# Patient Record
Sex: Male | Born: 1960 | Race: White | Hispanic: No | Marital: Single | Smoking: Former smoker
Health system: Southern US, Community
[De-identification: ages and names within clinical notes are randomized; demographics above are authoritative.]

## PROBLEM LIST (undated history)

## (undated) DIAGNOSIS — E785 Hyperlipidemia, unspecified: Secondary | ICD-10-CM

## (undated) DIAGNOSIS — K5900 Constipation, unspecified: Secondary | ICD-10-CM

## (undated) DIAGNOSIS — I639 Cerebral infarction, unspecified: Secondary | ICD-10-CM

## (undated) DIAGNOSIS — I69351 Hemiplegia and hemiparesis following cerebral infarction affecting right dominant side: Secondary | ICD-10-CM

## (undated) DIAGNOSIS — M79604 Pain in right leg: Secondary | ICD-10-CM

## (undated) DIAGNOSIS — R471 Dysarthria and anarthria: Secondary | ICD-10-CM

## (undated) DIAGNOSIS — R197 Diarrhea, unspecified: Secondary | ICD-10-CM

---

## 2020-01-31 ENCOUNTER — Encounter: Payer: Self-pay | Admitting: Emergency Medicine

## 2020-01-31 ENCOUNTER — Emergency Department: Payer: Medicaid Other

## 2020-01-31 ENCOUNTER — Emergency Department
Admission: EM | Admit: 2020-01-31 | Discharge: 2020-01-31 | Disposition: A | Discharge status: Home / Self Care | Payer: Medicaid Other | Attending: Emergency Medicine | Admitting: Emergency Medicine

## 2020-01-31 ENCOUNTER — Other Ambulatory Visit: Payer: Self-pay

## 2020-01-31 DIAGNOSIS — Z87891 Personal history of nicotine dependence: Secondary | ICD-10-CM | POA: Diagnosis not present

## 2020-01-31 DIAGNOSIS — R112 Nausea with vomiting, unspecified: Secondary | ICD-10-CM | POA: Diagnosis present

## 2020-01-31 DIAGNOSIS — I82411 Acute embolism and thrombosis of right femoral vein: Secondary | ICD-10-CM

## 2020-01-31 DIAGNOSIS — R531 Weakness: Secondary | ICD-10-CM | POA: Insufficient documentation

## 2020-01-31 DIAGNOSIS — R197 Diarrhea, unspecified: Secondary | ICD-10-CM | POA: Insufficient documentation

## 2020-01-31 DIAGNOSIS — Z20822 Contact with and (suspected) exposure to covid-19: Secondary | ICD-10-CM | POA: Diagnosis not present

## 2020-01-31 DIAGNOSIS — M7989 Other specified soft tissue disorders: Secondary | ICD-10-CM | POA: Insufficient documentation

## 2020-01-31 DIAGNOSIS — L03115 Cellulitis of right lower limb: Secondary | ICD-10-CM

## 2020-01-31 HISTORY — DX: Dysarthria and anarthria: R47.1

## 2020-01-31 HISTORY — DX: Constipation, unspecified: K59.00

## 2020-01-31 HISTORY — DX: Cerebral infarction, unspecified: I63.9

## 2020-01-31 HISTORY — DX: Diarrhea, unspecified: R19.7

## 2020-01-31 HISTORY — DX: Pain in right leg: M79.604

## 2020-01-31 HISTORY — DX: Hemiplegia and hemiparesis following cerebral infarction affecting right dominant side: I69.351

## 2020-01-31 HISTORY — DX: Hyperlipidemia, unspecified: E78.5

## 2020-01-31 LAB — CBC
HCT: 42.2 % (ref 39.0–52.0)
Hemoglobin: 13.6 g/dL (ref 13.0–17.0)
MCH: 30.7 pg (ref 26.0–34.0)
MCHC: 32.2 g/dL (ref 30.0–36.0)
MCV: 95.3 fL (ref 80.0–100.0)
Platelets: 320 10*3/uL (ref 150–400)
RBC: 4.43 MIL/uL (ref 4.22–5.81)
RDW: 12.9 % (ref 11.5–15.5)
WBC: 9.7 10*3/uL (ref 4.0–10.5)
nRBC: 0 % (ref 0.0–0.2)

## 2020-01-31 LAB — RESP PANEL BY RT-PCR (FLU A&B, COVID) ARPGX2
Influenza A by PCR: NEGATIVE
Influenza B by PCR: NEGATIVE
SARS Coronavirus 2 by RT PCR: NEGATIVE

## 2020-01-31 LAB — BASIC METABOLIC PANEL
Anion gap: 9 (ref 5–15)
BUN: 18 mg/dL (ref 6–20)
CO2: 28 mmol/L (ref 22–32)
Calcium: 8.8 mg/dL — ABNORMAL LOW (ref 8.9–10.3)
Chloride: 104 mmol/L (ref 98–111)
Creatinine, Ser: 0.94 mg/dL (ref 0.61–1.24)
GFR, Estimated: >60 mL/min (ref >60)
Glucose, Bld: 109 mg/dL — ABNORMAL HIGH (ref 70–99)
Potassium: 3.8 mmol/L (ref 3.5–5.1)
Sodium: 141 mmol/L (ref 135–145)

## 2020-01-31 IMAGING — US US EXTREM LOW VENOUS*R*
1 series · 14 of 24 positions shown · non-contrast
Comparison: None.

CLINICAL DATA: Pain and swelling.  Symptoms for 1 week.

EXAM:
RIGHT LOWER EXTREMITY VENOUS DOPPLER ULTRASOUND
TECHNIQUE: Gray-scale sonography with compression, as well as color and duplex
ultrasound, were performed to evaluate the deep venous system(s)
from the level of the common femoral vein through the popliteal and
proximal calf veins.

[Series 1: us venous img lower uni right (dvt) · portal-venous · 14 of 35 slices shown]
[im 1/35]
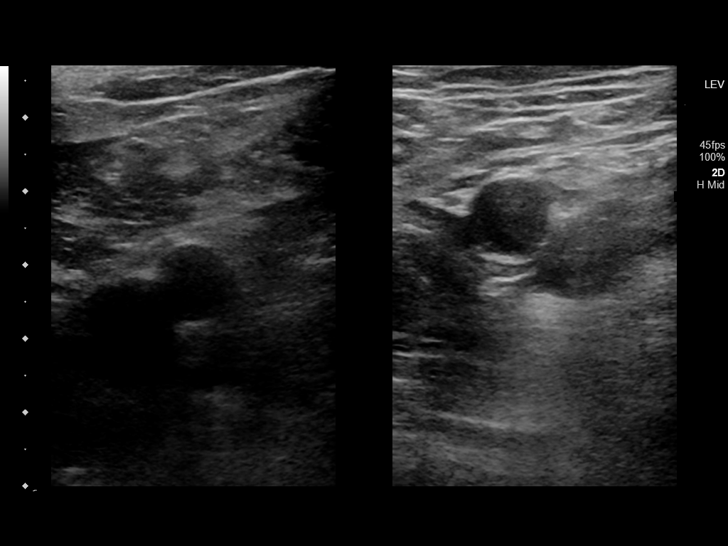
[im 3/35]
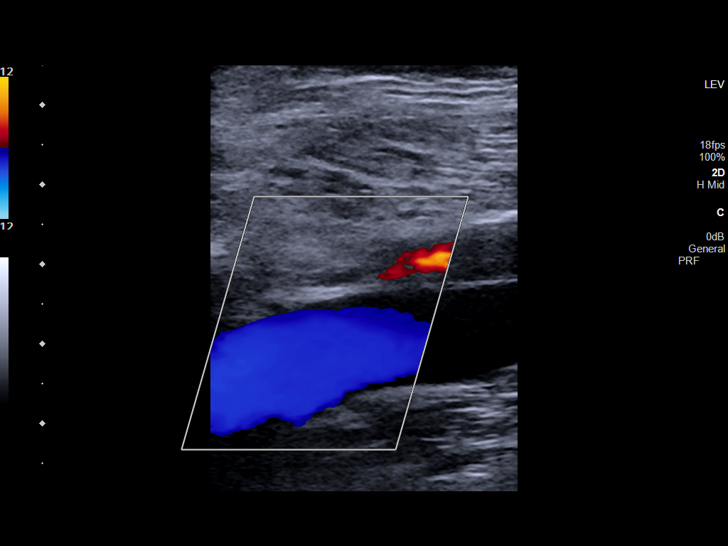
[im 6/35]
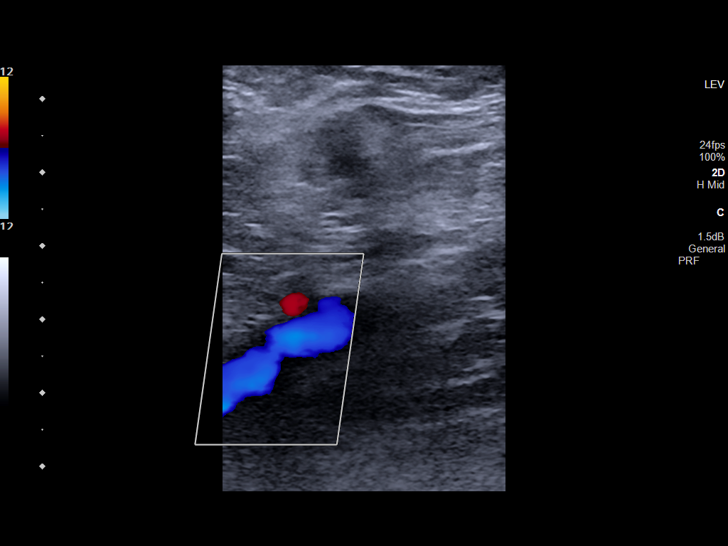
[im 9/35]
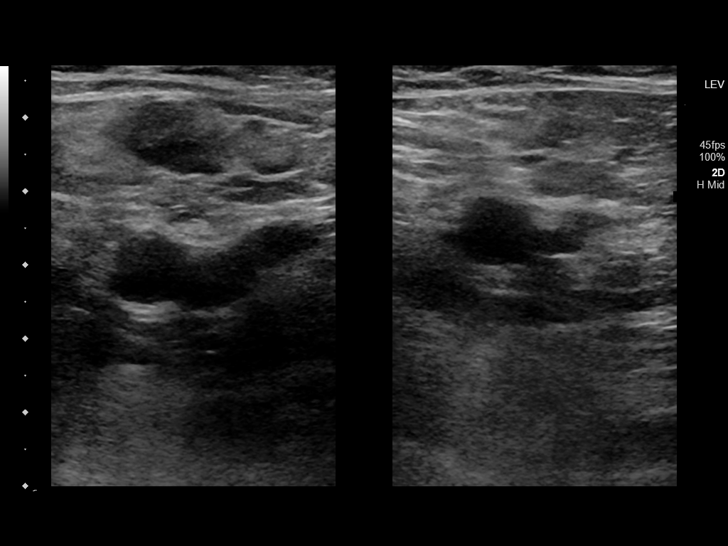
[im 11/35]
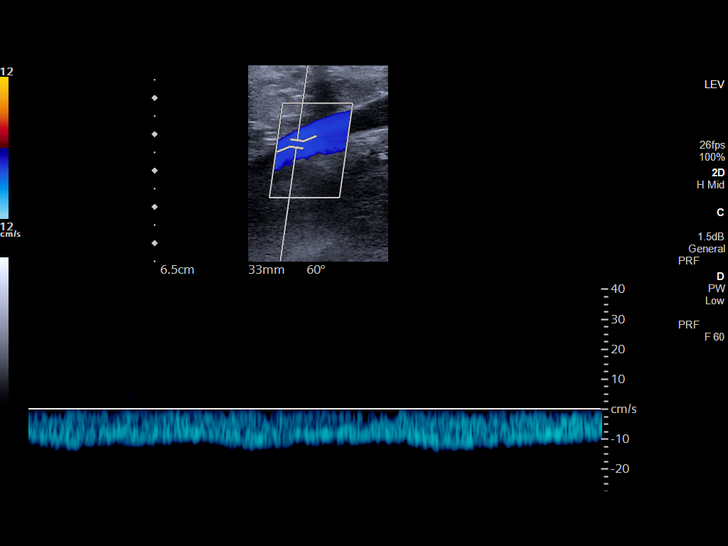
[im 14/35]
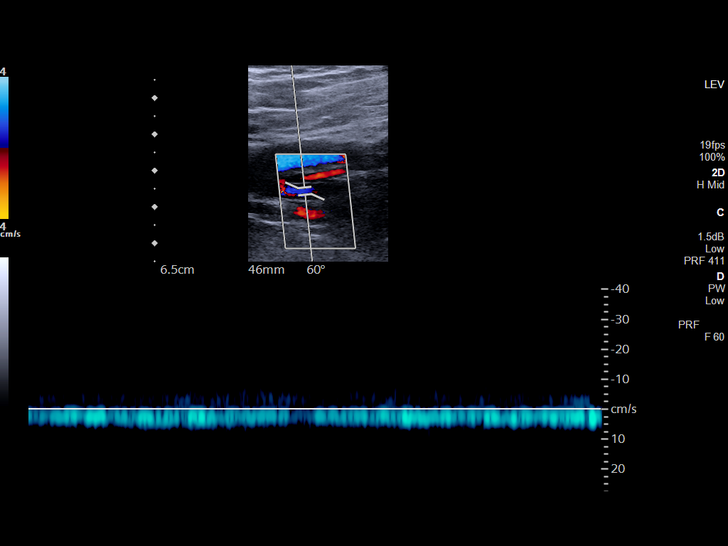
[im 17/35]
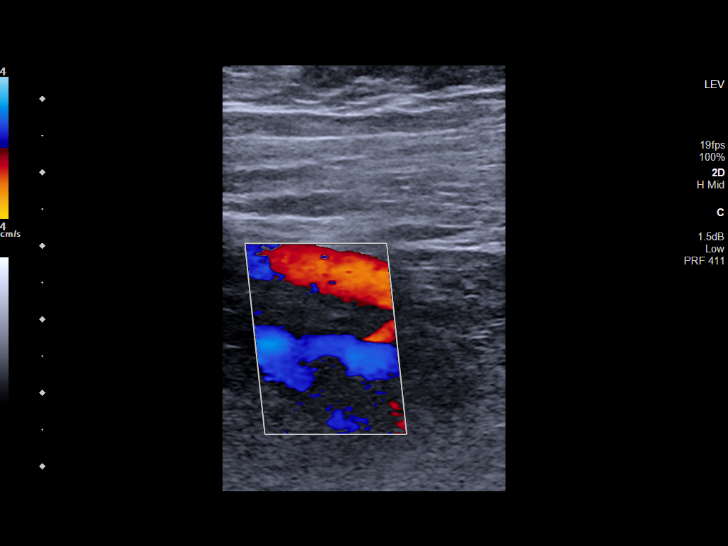
[im 18/35]
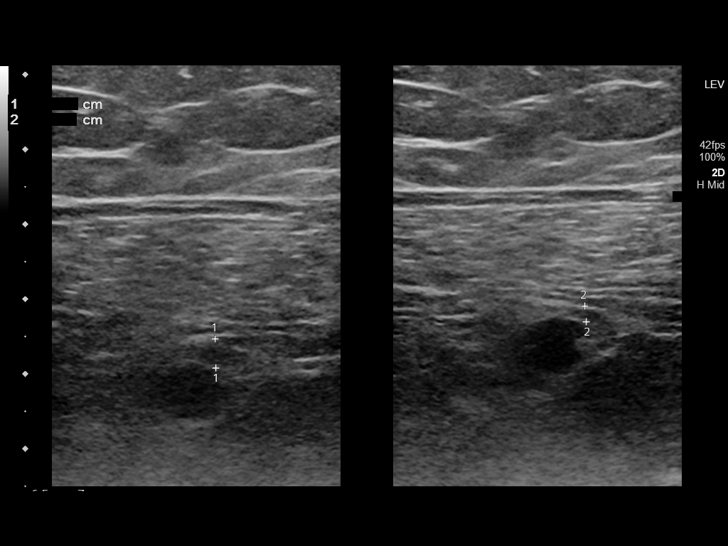
[im 21/35]
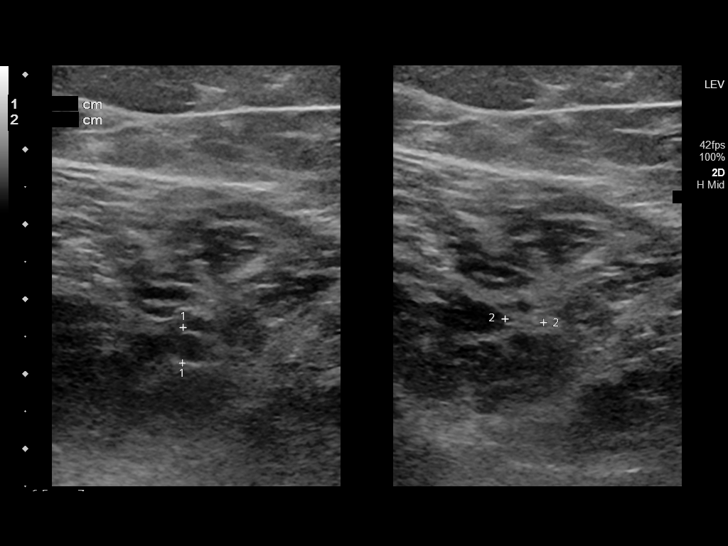
[im 24/35]
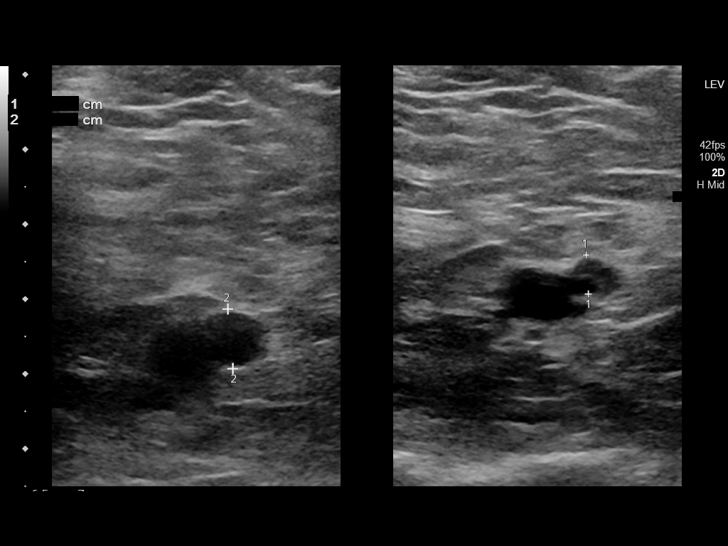
[im 27/35]
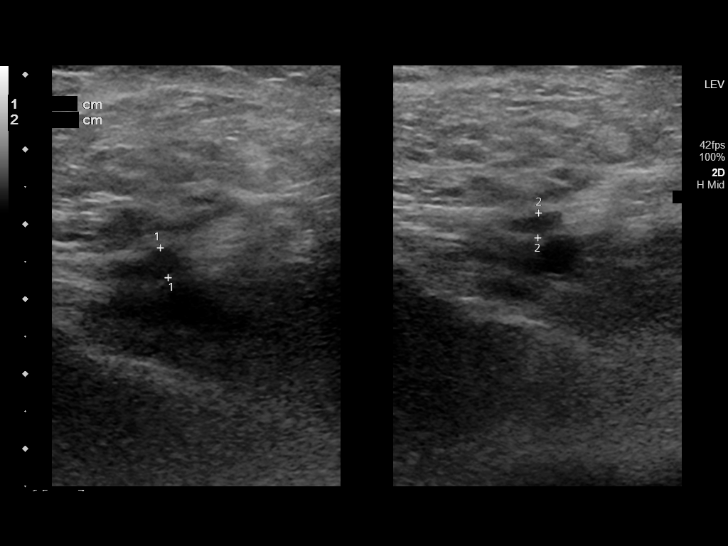
[im 29/35]
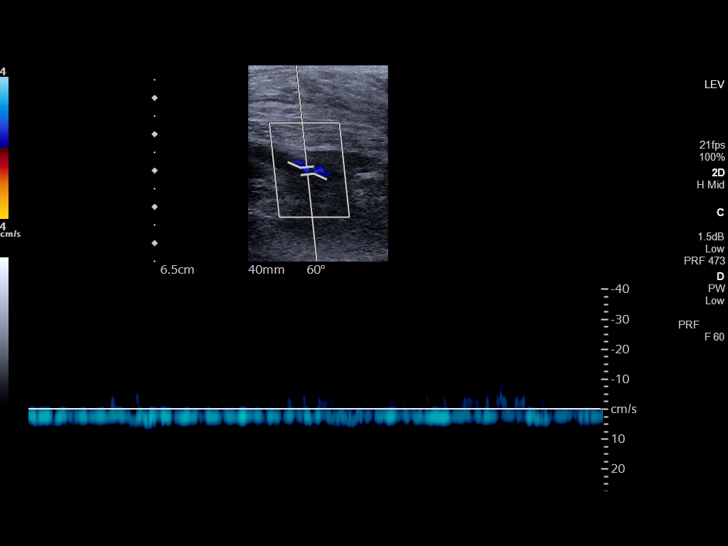
[im 32/35]
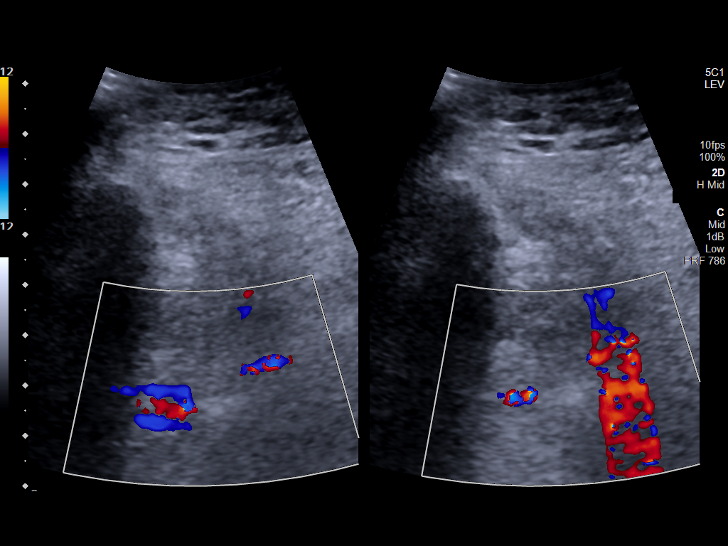
[im 35/35]
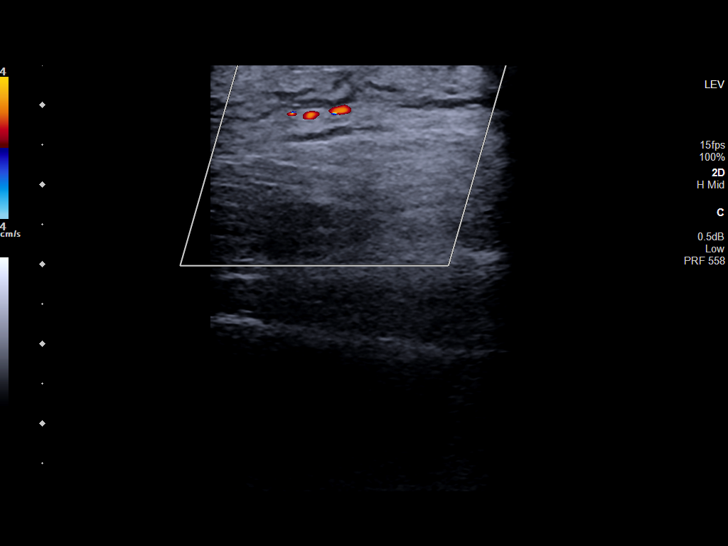

[14 of 24 positions shown; findings below may reference images not displayed]

FINDINGS: VENOUS

There is nonocclusive thrombus within the mid femoral as well as
popliteal vein. Vessels are small in caliber, which limits detailed
assessment in areas. The common femoral, profundus femoral, and
saphenofemoral junction are normal without thrombus. The posterior
tibial vein is difficult to visualize. No thrombus in the peroneal
vein.

Limited views of the contralateral common femoral vein are
unremarkable.

OTHER

Soft tissue edema is noted labeled area of pain.

Limitations: none
IMPRESSION: Positive for nonocclusive DVT in the mid femoral and popliteal
veins.

These results were called by telephone at the time of interpretation
on [DATE] at [DATE] to provider NOSA , who verbally
acknowledged these results.

## 2020-01-31 IMAGING — CT CT RENAL STONE PROTOCOL
2 of 4 series · 16 of 46 positions shown, 18 images · non-contrast
Comparison: None.

CLINICAL DATA: Bilateral flank pain.

EXAM:
CT ABDOMEN AND PELVIS WITHOUT CONTRAST
TECHNIQUE: Multidetector CT imaging of the abdomen and pelvis was performed
following the standard protocol without IV contrast.

[Series 2: stone full standard · axial · 0.82mm/px · z∈[-874,-414]mm · 13 of 102 slices shown, 15 images]
[im 5/102  soft-tissue]
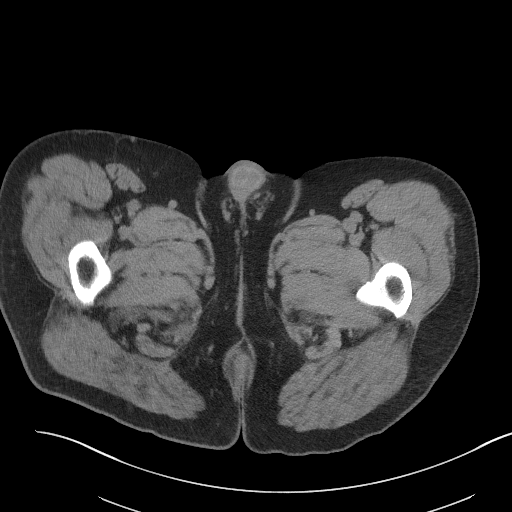
[im 5/102  bone]
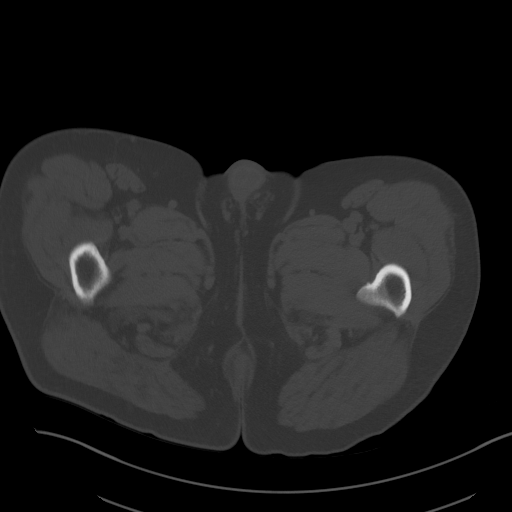
[im 13/102  soft-tissue]
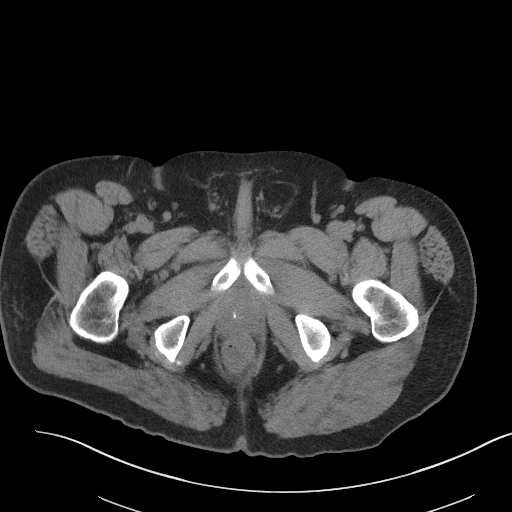
[im 22/102  soft-tissue]
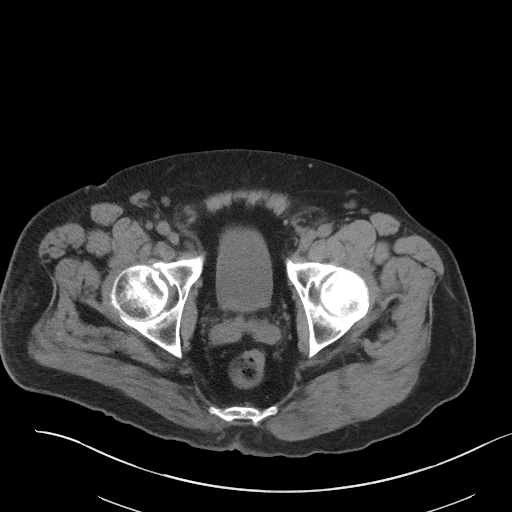
[im 30/102  soft-tissue]
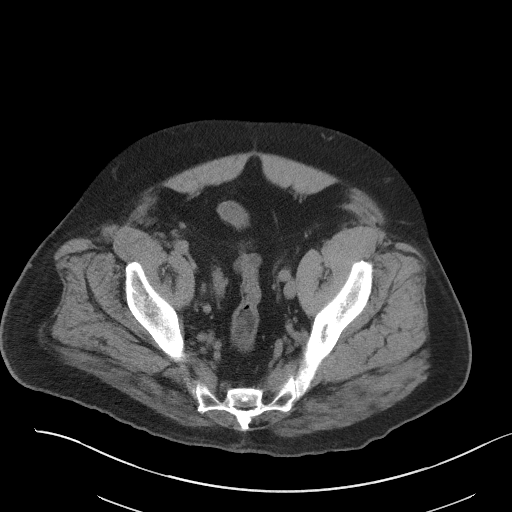
[im 34/102  soft-tissue]
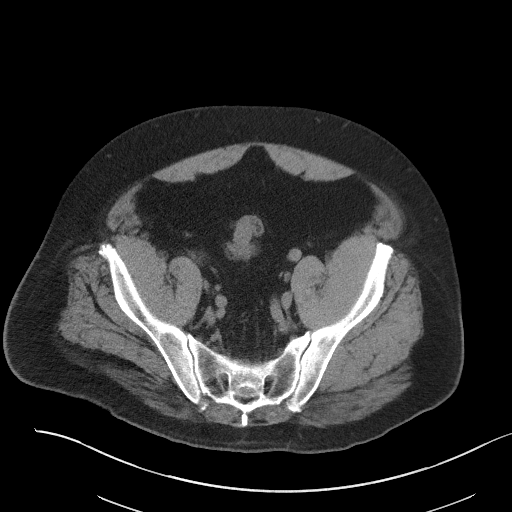
[im 43/102  soft-tissue]
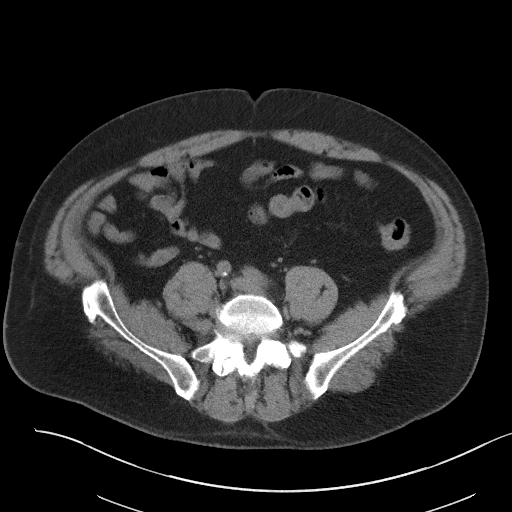
[im 51/102  soft-tissue]
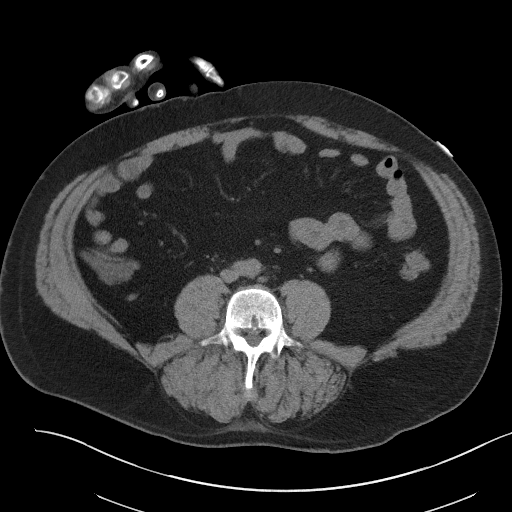
[im 59/102  soft-tissue]
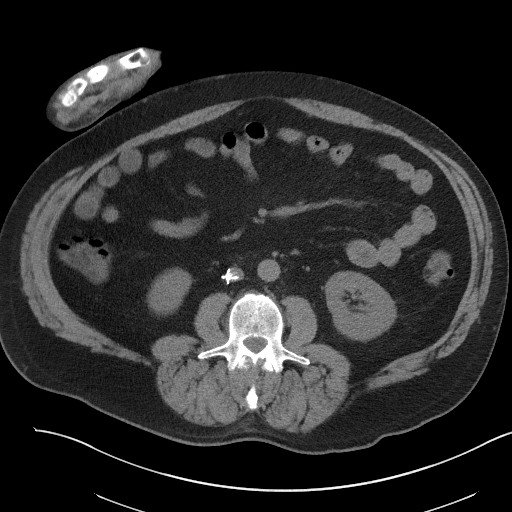
[im 68/102  soft-tissue]
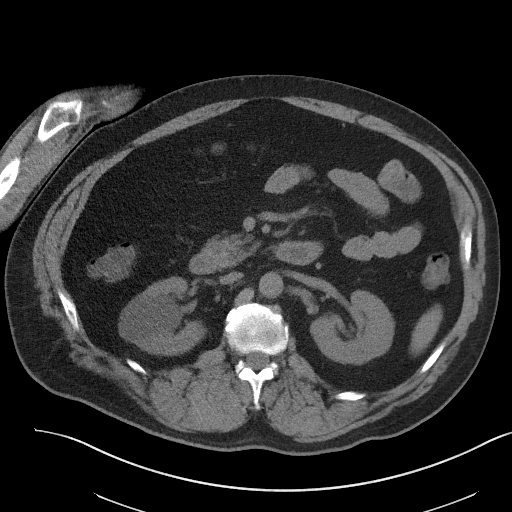
[im 68/102  bone]
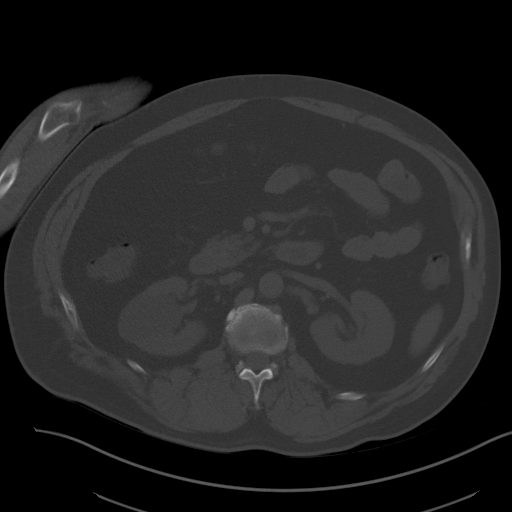
[im 72/102  soft-tissue]
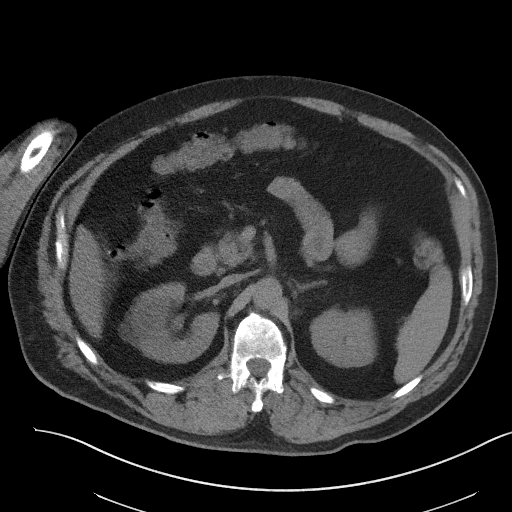
[im 80/102  soft-tissue]
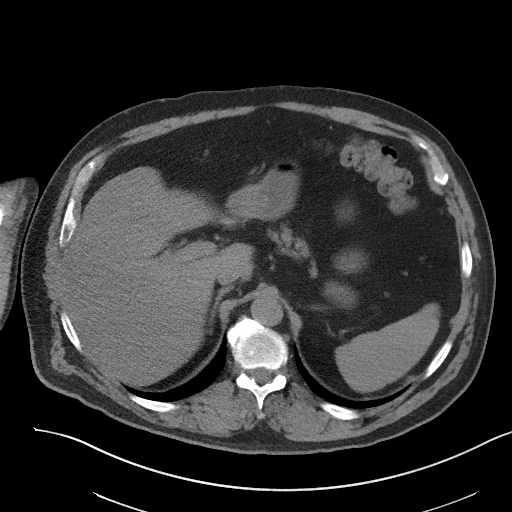
[im 89/102  soft-tissue]
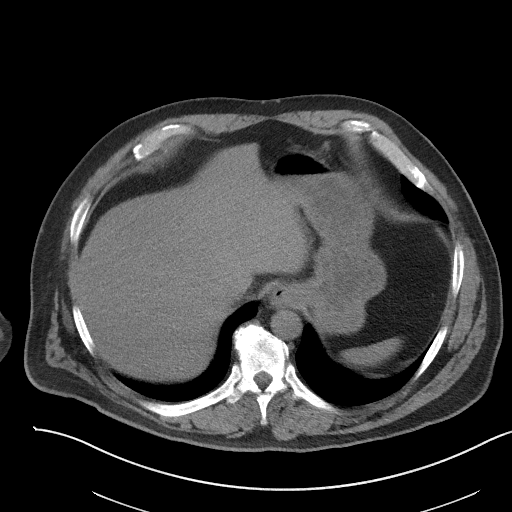
[im 97/102  soft-tissue]
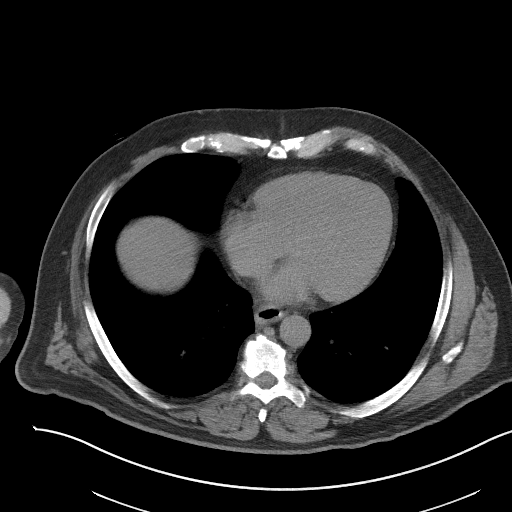

[Series 5: coronal · coronal · 0.84mm/px · 3 of 169 slices shown]
[im 57/169  soft-tissue]
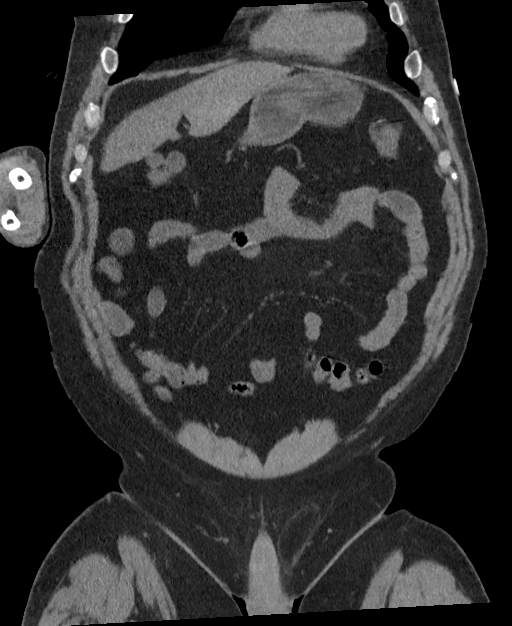
[im 75/169  soft-tissue]
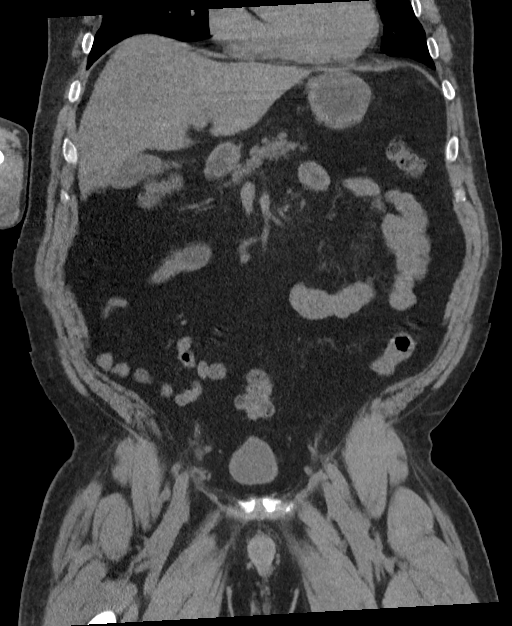
[im 94/169  soft-tissue]
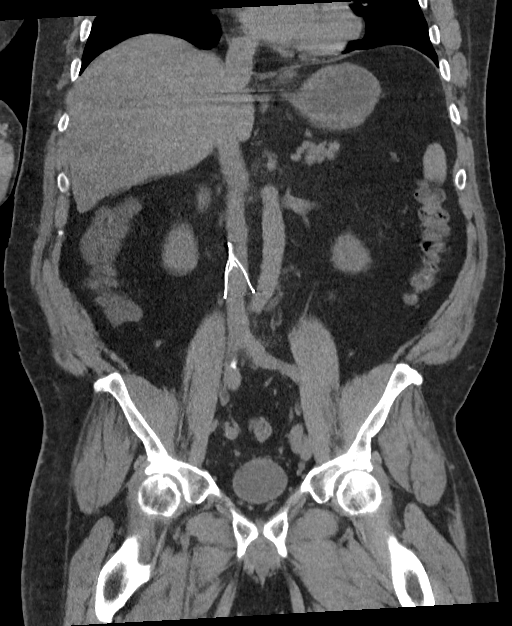

[16 of 46 positions shown; findings below may reference images not displayed]

FINDINGS: LOWER CHEST: Normal.

HEPATOBILIARY: Normal hepatic contours. No intra- or extrahepatic
biliary dilatation. The gallbladder is normal.

PANCREAS: Normal pancreas. No ductal dilatation or peripancreatic
fluid collection.

SPLEEN: Normal.

ADRENALS/URINARY TRACT: The adrenal glands are normal. No
hydronephrosis, nephroureterolithiasis or solid renal mass. The
urinary bladder is normal for degree of distention 5.5 cm right
renal cyst.

STOMACH/BOWEL: There is no hiatal hernia. Normal duodenal course and
caliber. No small bowel dilatation or inflammation. No focal colonic
abnormality. Normal appendix.

VASCULAR/LYMPHATIC: Infrarenal IVC filter.  No lymphadenopathy.

REPRODUCTIVE: Enlarged prostate measures 5.4 cm in transverse
dimension.

MUSCULOSKELETAL. Multilevel degenerative disc disease and facet
arthrosis. No bony spinal canal stenosis.

OTHER: None.
IMPRESSION: 1. No acute abnormality of the abdomen or pelvis. Specifically, no
nephroureterolithiasis or obstructive uropathy.
2. Enlarged prostate.

## 2020-01-31 IMAGING — DX DG CHEST 1V PORT
1 series · 1 of 1 positions shown · non-contrast
Comparison: None.

CLINICAL DATA: Shortness of breath.

EXAM:
PORTABLE CHEST 1 VIEW

[chest ap]
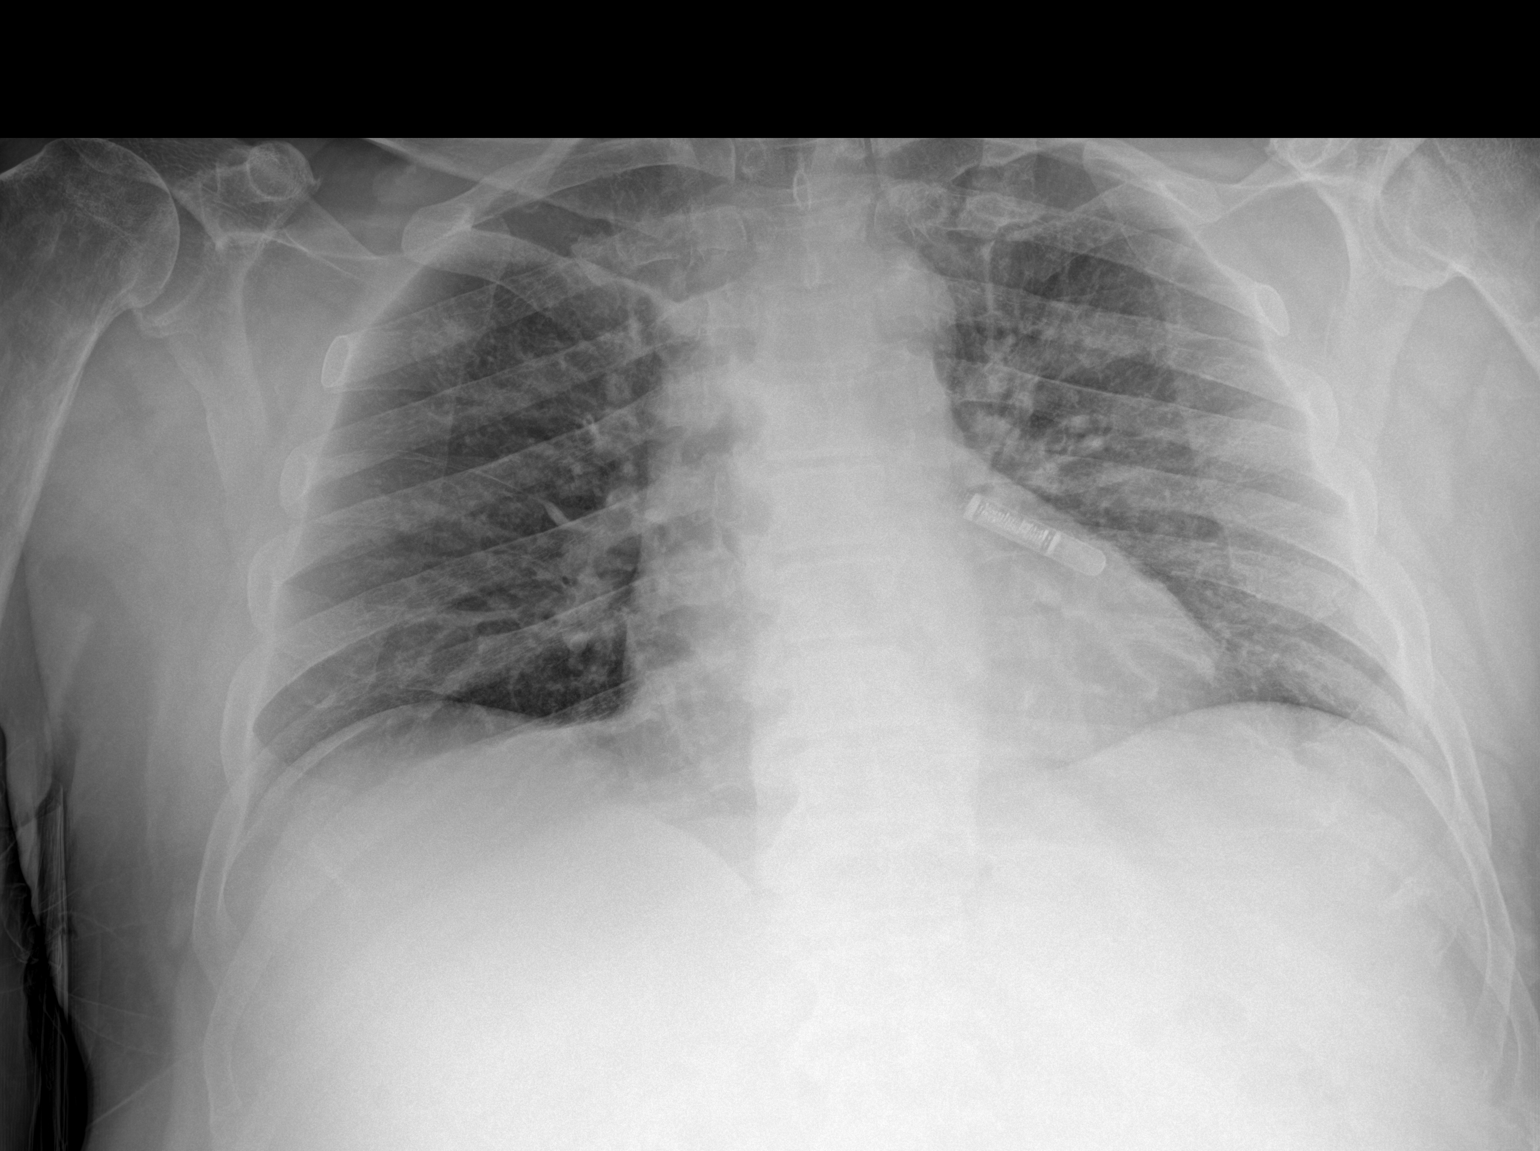

[1 of 1 positions shown; findings below may reference images not displayed]

FINDINGS: There are prominent interstitial lung markings bilaterally. The lung
volumes are low. The heart size is mildly enlarged. There is a
rounded airspace opacity overlying the right upper lung zone. There
is no pneumothorax or large pleural effusion. A loop recorder is
noted.
IMPRESSION: 1. Prominent interstitial lung markings which can be seen in
patients with an atypical infectious process or developing
interstitial edema.
2. Low lung volumes.
3. Rounded density projecting over the right upper lung zone. A 4-6
week follow-up two-view chest x-ray is recommended to confirm
resolution of this finding.

## 2020-01-31 MED ORDER — ONDANSETRON HCL 4 MG/2ML IJ SOLN
4.0000 mg | Freq: Once | INTRAMUSCULAR | Status: AC
  Administered 2020-01-31: 4 mg via INTRAVENOUS
  Filled 2020-01-31: qty 2

## 2020-01-31 MED ORDER — CEPHALEXIN 500 MG PO CAPS: 500.0000 mg | ORAL_CAPSULE | Freq: Three times a day (TID) | ORAL | 0 refills | Status: AC

## 2020-01-31 MED ORDER — APIXABAN (ELIQUIS) VTE STARTER PACK (10MG AND 5MG): ORAL_TABLET | ORAL | 0 refills | Status: DC

## 2020-01-31 MED ORDER — SODIUM CHLORIDE 0.9 % IV BOLUS
1000.0000 mL | Freq: Once | INTRAVENOUS | Status: AC
  Administered 2020-01-31: 1000 mL via INTRAVENOUS

## 2020-01-31 MED ORDER — SULFAMETHOXAZOLE-TRIMETHOPRIM 800-160 MG PO TABS: 1.0000 | ORAL_TABLET | Freq: Two times a day (BID) | ORAL | 0 refills | Status: AC

## 2020-01-31 MED ORDER — SODIUM CHLORIDE 0.9 % IV SOLN
2.0000 g | Freq: Once | INTRAVENOUS | Status: AC
  Administered 2020-01-31: 2 g via INTRAVENOUS
  Filled 2020-01-31: qty 20

## 2020-01-31 MED ORDER — KETOROLAC TROMETHAMINE 30 MG/ML IJ SOLN
15.0000 mg | Freq: Once | INTRAMUSCULAR | Status: DC
  Filled 2020-01-31: qty 1

## 2020-01-31 MED ORDER — APIXABAN 5 MG PO TABS
10.0000 mg | ORAL_TABLET | Freq: Once | ORAL | Status: AC
  Administered 2020-01-31: 10 mg via ORAL
  Filled 2020-01-31: qty 2

## 2020-01-31 MED ORDER — ONDANSETRON 4 MG PO TBDP: 4.0000 mg | ORAL_TABLET | Freq: Three times a day (TID) | ORAL | 0 refills | Status: DC | PRN

## 2020-01-31 NOTE — ED Triage Notes (Signed)
Blood drawn and sent to lab.

## 2020-01-31 NOTE — Discharge Instructions (Addendum)
For your leg swelling:  - Start Eliquis for a DVT (blood clot) - Take the antibiotics (Keflex and Bactrim) for possible infection  For your nausea, vomiting:  - Take Zofran every 6-8 hours as needed - Try to drink plenty of fluids

## 2020-01-31 NOTE — ED Triage Notes (Signed)
See previous note; pt also with wound to R lateral calf at this time. Pt also c/o generalized abdominal pain at this time.

## 2020-01-31 NOTE — ED Triage Notes (Signed)
Pt arrives via EMS from [facility] skilled facility, pt states generalized not feeling well, per EMS rapid COVID negative yesterday, pt has hx of CVA with residual aphasia

## 2020-01-31 NOTE — ED Provider Notes (Signed)
Assumed care from Dr. Lenard Lance at 8 PM. Briefly, the patient is a 60 y.o. male with PMHx of  has a past medical history of Constipation, CVA (cerebral vascular accident) (HCC), Diarrhea, Dysarthria and anarthria, Hemiplegia and hemiparesis following cerebral infarction affecting right dominant side (HCC), Hyperlipidemia, and Pain in right leg. here with generalized weakness. Also noted R leg swelling. H/o CVA with R sided paralysis, expressive aphasia. Since yesterday, he's been weak, fatigued with n/v/d yesterday. Rapid covid neg at nursing home.   Labs Reviewed  BASIC METABOLIC PANEL - Abnormal; Notable for the following components:      Result Value   Glucose, Bld 109 (*)    Calcium 8.8 (*)    All other components within normal limits  RESP PANEL BY RT-PCR (FLU A&B, COVID) ARPGX2  URINE CULTURE  CBC  URINALYSIS, COMPLETE (UACMP) WITH MICROSCOPIC  CBG MONITORING, ED    Course of Care: -Resp panel negative. Pt unable to provide urine. However, he feels better and is requesting d/c. Labs reviewed, imaging reviewed. Pt has RLE nonocclusive femoral DVT, as well as possible superimposed cellulitis of R leg. On my exam, distal pulses intact. No signs of phlegmasia. He has an abrasion that likely has developed a mild surrounding cellulitis. Will tx broadly given his comorbidities. Otherwise, unable to urinate but pt has no urinary sx, and will be on ABX for cellulitis. Zofran given for nausea control. Pt has had no emesis, diarrhea in ED. CT scan unremarkable.     Jonathan Pollack, MD 02/01/20 0020

## 2020-01-31 NOTE — ED Provider Notes (Signed)
[facility] Emergency Department Provider Note  Time seen: 7:29 PM  I have reviewed the triage vital signs and the nursing notes.   HISTORY  Chief Complaint Nausea and Emesis   HPI Jonathan Newman is a 60 y.o. male with a past medical history of CVA with right-sided weakness, hemiplegia and aphasia presents to the emergency department from his nursing facility with reports of nausea vomiting since yesterday as well as swelling and a wound to his right lower extremity.  According to family is here with the patient he lives at a nursing facility is normally able to wheel himself around in his wheelchair, has expressive aphasia but can communicate with head nods for the most part.  Patient began with nausea vomiting which progressed to diarrhea yesterday.  Wife is also noted for the past week or so he has had swelling in his right lower extremity with a wound to the lateral aspect of the right lower extremity.  No known fever.  No known cough.  Past Medical History:  Diagnosis Date  . Constipation   . CVA (cerebral vascular accident) (HCC)   . Diarrhea   . Dysarthria and anarthria   . Hemiplegia and hemiparesis following cerebral infarction affecting right dominant side (HCC)   . Hyperlipidemia   . Pain in right leg     There are no problems to display for this patient.   Prior to Admission medications   Not on File    No Known Allergies  No family history on file.  Social History Social History   Tobacco Use  . Smoking status: Former Games developer  . Smokeless tobacco: Never Used  Substance Use Topics  . Alcohol use: Not Currently  . Drug use: Not Currently    Review of Systems Per wife Constitutional: Negative for fever. Cardiovascular: Negative for chest pain. Respiratory: Negative for shortness of breath. Gastrointestinal: Believes he was having abdominal pain earlier today.  Positive for nausea vomiting diarrhea since yesterday. Genitourinary:  Family states he was straining to urinate but is not sure if he is having any pain with urination. Musculoskeletal: Negative for musculoskeletal complaints Neurological: Negative for headache All other ROS negative  ____________________________________________   PHYSICAL EXAM:  VITAL SIGNS: ED Triage Vitals  Enc Vitals Group     BP 01/31/20 1556 (!) 150/93     Pulse Rate 01/31/20 1556 (!) 52     Resp 01/31/20 1556 20     Temp 01/31/20 1556 98.2 F (36.8 C)     Temp Source 01/31/20 1556 Oral     SpO2 01/31/20 1556 98 %     Weight 01/31/20 1557 230 lb (104.3 kg)     Height 01/31/20 1557 5\' 8"  (1.727 m)     Head Circumference --      Peak Flow --      Pain Score --      Pain Loc --      Pain Edu? --      Excl. in GC? --    Constitutional: Patient is awake and alert, no acute distress. Eyes: Normal exam ENT      Head: Normocephalic and atraumatic.      Mouth/Throat: Mucous membranes are moist. Cardiovascular: Normal rate, regular rhythm.  Respiratory: Normal respiratory effort without tachypnea nor retractions. Breath sounds are clear Gastrointestinal: Soft, no obvious tenderness to palpation.  Minimal distention.  No rebound or guarding. Musculoskeletal: Does have moderate edema in his right lower extremity 2+.  Has a wound to  the lateral aspect of the right calf that is erythematous approximately 10 cm in diameter. Neurologic: Patient has aphasia but can communicate somewhat with sounds and moving his head. Skin:  Skin is warm, dry.  Erythema to the wound in the right lower extremity as described above. Psychiatric: Mood and affect are normal.  ____________________________________________    EKG  EKG viewed and interpreted by myself shows sinus bradycardia 53 bpm with a narrow QRS, normal axis, normal intervals with no concerning ST findings.  ____________________________________________    RADIOLOGY  CT scan negative for acute  abnormality.  ____________________________________________   INITIAL IMPRESSION / ASSESSMENT AND PLAN / ED COURSE  Pertinent labs & imaging results that were available during my care of the patient were reviewed by me and considered in my medical decision making (see chart for details).   Patient presents emergency department for nausea vomiting diarrhea since last night and weakness today.  Patient has history of CVA with expressive aphasia as well as right-sided hemiparesis.  On examination patient does have a swollen right lower extremity with a wound to the lateral aspect of the calf possibly indicating cellulitis versus DVT.  We will obtain an ultrasound to further evaluate.  Patient does have a mildly distended abdomen and per family was complaining of abdominal pain earlier but appears to have a benign abdominal exam currently.  However given his deficits we will obtain CT imaging to rule out intra-abdominal pathology such as partial SBO or infectious etiology.  Family states the patient appeared to be straining when attempting to urinate we will obtain a urine sample to rule out urinary tract infection.  Patient did have a rapid Covid swab performed at the nursing home today we will perform a PCR swab to rule out Covid.  We will dose IV fluids and continue to closely monitor.  Family agreeable to plan.  CT imaging negative.  Basic labs are reassuring.  Urine is pending, Covid is pending, ultrasound is pending.  [name] was evaluated in Emergency Department on 01/31/2020 for the symptoms described in the history of present illness. He was evaluated in the context of the global COVID-19 pandemic, which necessitated consideration that the patient might be at risk for infection with the SARS-CoV-2 virus that causes COVID-19. Institutional protocols and algorithms that pertain to the evaluation of patients at risk for COVID-19 are in a state of rapid change based on information released by  regulatory bodies including the CDC and federal and state organizations. These policies and algorithms were followed during the patient's care in the ED.  ____________________________________________   FINAL CLINICAL IMPRESSION(S) / ED DIAGNOSES  Nausea vomiting diarrhea   Minna Antis, MD 01/31/20 1939

## 2020-03-07 ENCOUNTER — Ambulatory Visit (INDEPENDENT_AMBULATORY_CARE_PROVIDER_SITE_OTHER): Payer: Medicaid Other | Admitting: Vascular Surgery

## 2020-03-07 ENCOUNTER — Other Ambulatory Visit: Payer: Self-pay

## 2020-03-07 ENCOUNTER — Encounter (INDEPENDENT_AMBULATORY_CARE_PROVIDER_SITE_OTHER): Payer: Self-pay | Admitting: Vascular Surgery

## 2020-03-07 DIAGNOSIS — L97211 Non-pressure chronic ulcer of right calf limited to breakdown of skin: Secondary | ICD-10-CM

## 2020-03-07 DIAGNOSIS — I82411 Acute embolism and thrombosis of right femoral vein: Secondary | ICD-10-CM

## 2020-03-07 DIAGNOSIS — I82409 Acute embolism and thrombosis of unspecified deep veins of unspecified lower extremity: Secondary | ICD-10-CM | POA: Insufficient documentation

## 2020-03-07 DIAGNOSIS — I639 Cerebral infarction, unspecified: Secondary | ICD-10-CM | POA: Diagnosis not present

## 2020-03-07 DIAGNOSIS — Z8673 Personal history of transient ischemic attack (TIA), and cerebral infarction without residual deficits: Secondary | ICD-10-CM | POA: Insufficient documentation

## 2020-03-07 NOTE — Patient Instructions (Signed)
Deep Vein Thrombosis  Deep vein thrombosis (DVT) is a condition in which a blood clot forms in a deep vein, such as a vein in the lower leg, thigh, pelvis, or arm. Deep veins are veins in the deep venous system. A clot is blood that has thickened into a gel or solid. This condition is serious and can be life-threatening if the clot travels to the lungs and causes a blockage (pulmonary embolism) in the arteries of the lung. A DVT can also damage veins in the leg. This can lead to long-term, or chronic, venous disease, leg pain, swelling, discoloration, and ulcers or sores (post-thrombotic syndrome). What are the causes? This condition may be caused by:  A slowdown of blood flow.  Damage to a vein.  A condition that causes blood to clot more easily, such as certain blood-clotting disorders. What increases the risk? The following factors may make you more likely to develop this condition:  Having obesity.  Being older, especially older than age 60.  Being inactive (sedentary lifestyle) or not moving around. This may include: ? Sitting or lying down for longer than 4-6 hours other than to sleep at night. ? Being in the hospital, having major or lengthy surgery, or having a thin, flexible tube (central line catheter) placed in a large vein.  Being pregnant, giving birth, or having recently given birth.  Taking medicines that contain estrogen, such as birth control or hormone replacement therapy.  Using products that contain nicotine or tobacco, especially if you use hormonal birth control.  Having a history of blood clots or a blood-clotting disease, a blood vessel disease (peripheral vascular disease), or congestive heart disease.  Having a history of cancer, especially if being treated with chemotherapy. What are the signs or symptoms? Symptoms of this condition include:  Swelling, pain, pressure, or tenderness in an arm or a leg.  An arm or a leg becoming warm, red, or  discolored.  A leg turning very pale. You may have a large DVT. This is rare. If the clot is in your leg, you may notice symptoms more or have worse symptoms when you stand or walk. In some cases, there are no symptoms. How is this diagnosed? This condition is diagnosed with:  Your medical history and a physical exam.  Tests, such as: ? Blood tests to check how well your blood clots. ? Doppler ultrasound. This is the best way to find a DVT. ? Venogram. Contrast dye is injected into a vein, and X-rays are taken to check for clots. How is this treated? Treatment for this condition depends on:  The cause of your DVT.  The size and location of your DVT, or having more than one DVT.  Your risk for bleeding or developing more clots.  Other medical conditions you may have. Treatment may include:  Taking a blood thinner, also called an anticoagulant, to prevent clots from forming and growing.  Wearing compression stockings, if directed.  Injecting medicines into the affected vein to break up the clot (catheter-directed thrombolysis). This is used only for severe DVT and only if a specialist recommends it.  Specific surgical procedures, when DVT is severe or hard to treat. These may be done to: ? Isolate and remove your clot. ? Place an inferior vena cava (IVC) filter in a large vein to catch blood clots before they reach your lungs. You may get some medical treatments for 6 months or longer. Follow these instructions at home: If you are taking blood thinners:    Talk with your health care provider before you take any medicines that contain aspirin or NSAIDs, such as ibuprofen. These medicines increase your risk for dangerous bleeding.  Take your medicine exactly as told, at the same time every day. Do not skip a dose. Do not take more than the prescribed dose. This is important.  Ask your health care provider about foods and medicines that could change the way your blood thinner  works (may interact). Avoid these foods and medicines if you are told to do so.  Avoid anything that may cause bleeding or bruising. You may bleed more easily while taking blood thinners. ? Be very careful when using knives, scissors, or other sharp objects. ? Use an electric razor instead of a blade. ? Avoid activities that could cause injury or bruising, and follow instructions for preventing falls. ? Tell your health care provider if you have had any internal bleeding, bleeding ulcers, or neurologic diseases, such as strokes or cerebral aneurysms.  Wear a medical alert bracelet or carry a card that lists what medicines you take. General instructions  Take over-the-counter and prescription medicines only as told by your health care provider.  Return to your normal activities as told by your health care provider. Ask your health care provider what activities are safe for you.  If recommended, wear compression stockings as told by your health care provider. These stockings help to prevent blood clots and reduce swelling in your legs.  Keep all follow-up visits as told by your health care provider. This is important. Contact a health care provider if:  You miss a dose of your blood thinner.  You have new or worse pain, swelling, or redness in an arm or a leg.  You have worsening numbness or tingling in an arm or a leg.  You have unusual bruising. Get help right away if:  You have signs or symptoms that a blood clot has moved to the lungs. These may include: ? Shortness of breath. ? Chest pain. ? Fast or irregular heartbeats (palpitations). ? Light-headedness or dizziness. ? Coughing up blood.  You have signs or symptoms that your blood is too thin. These may include: ? Blood in your vomit, stool, or urine. ? A cut that will not stop bleeding. ? A menstrual period that is heavier than usual. ? A severe headache or confusion. These symptoms may represent a serious problem that  is an emergency. Do not wait to see if the symptoms will go away. Get medical help right away. Call your local emergency services (911 in the [place] Do not drive yourself to the hospital. Summary  Deep vein thrombosis (DVT) happens when a blood clot forms in a deep vein. This may occur in the lower leg, thigh, pelvis, or arm.  Symptoms affect the arm or leg and can include swelling, pain, tenderness, warmth, redness, or discoloration.  This condition may be treated with medicines or compression stockings. In severe cases, surgery may be done.  If you are taking blood thinners, take them exactly as told. Do not skip a dose. Do not take more than is prescribed.  Get help right away if you have shortness of breath, chest pain, fast or irregular heartbeats, or blood in your vomit, urine, or stool. This information is not intended to replace advice given to you by your health care provider. Make sure you discuss any questions you have with your health care provider. Document Revised: 01/02/2019 Document Reviewed: 01/02/2019 Elsevier Patient Education  2021 [employer]   Inc.  

## 2020-03-07 NOTE — Assessment & Plan Note (Signed)
Although this is not well defined device duplex the hospital, the fact that is nonocclusive in the femoral and popliteal veins would suggest a more chronic process.  Either way, given his immobility with stroke it is appropriate to place him on anticoagulation and Eliquis will be used.  After several weeks in an [employer], we will evaluate him for compression garment of some sort.  Also would like to do a venous reflux study for further evaluation of the venous system on the right.  Return in several weeks.  Weekly Unna boots

## 2020-03-07 NOTE — Progress Notes (Signed)
Patient ID: Jonathan Newman, male   DOB: Jul 04, 1960, 60 y.o.   MRN: [id number]  Chief Complaint  Patient presents with  . New Patient (Initial Visit)    Ref Lorenda Hatchet rle dvt    HPI Jonathan Newman is a 60 y.o. male.  I am asked to see the patient by Dr. Lovenia Kim for evaluation of a right leg DVT as well as pain and ulceration on the right leg.  The patient had a devastating stroke about a year ago and is completely hemiparetic on the right.  He also has expressive aphasia and really cannot communicate to the history is obtained almost entirely from his sister and the previous medical record.  He has been having worsening pain and swelling in his right leg.  About a month ago, he had an ultrasound done at the hospital which showed nonocclusive thrombus in the femoral and popliteal veins on the right.  The fact that it was nonocclusive would suggest a more chronic process although this was not entirely reported there.  He was started on Eliquis which was appropriate which initially helped the pain and swelling for several days but this has returned.  He has now developed an ulceration on the anterior right lower leg with serous drainage and mild surrounding erythema.  The leg is painful as best we can tell.  The swelling has gradually worsened.     Past Medical History:  Diagnosis Date  . Constipation   . CVA (cerebral vascular accident) (HCC)   . Diarrhea   . Dysarthria and anarthria   . Hemiplegia and hemiparesis following cerebral infarction affecting right dominant side (HCC)   . Hyperlipidemia   . Pain in right leg      Family History Sister is healthy Mother died in May 27, 2018 No bleeding or clotting disorders   Social History   Tobacco Use  . Smoking status: Former Games developer  . Smokeless tobacco: Never Used  Substance Use Topics  . Alcohol use: Not Currently  . Drug use: Not Currently    No Known Allergies  Current Outpatient Medications  Medication Sig Dispense Refill  .  ACETAMINOPHEN PO Take 650 mg by mouth every 6 (six) hours as needed.    . APIXABAN (ELIQUIS) VTE STARTER PACK (10MG  AND 5MG ) Take as directed on package: start with two-5mg  tablets twice daily for 7 days. On day 8, switch to one-5mg  tablet twice daily. 1 each 0  . aspirin 325 MG EC tablet Take 325 mg by mouth daily.    atorvastatin (LIPITOR) 40 MG tablet Take 40 mg by mouth at bedtime.    . cephALEXin (KEFLEX) 500 MG capsule Take 500 mg by mouth 3 (three) times daily.    . divalproex (DEPAKOTE) 125 MG DR tablet Take 125 mg by mouth at bedtime.    lisinopril (ZESTRIL) 10 MG tablet Take 20 mg by mouth daily.    . ondansetron (ZOFRAN ODT) 4 MG disintegrating tablet Take 1 tablet (4 mg total) by mouth every 8 (eight) hours as needed for nausea or vomiting. 20 tablet 0  . ondansetron (ZOFRAN) 4 MG tablet Take 4 mg by mouth every 8 (eight) hours as needed for nausea or vomiting.    [name] PARoxetine (PAXIL) 20 MG tablet Take 20 mg by mouth daily.    . sennosides-docusate sodium (SENOKOT-S) 8.6-50 MG tablet Take 1 tablet by mouth as directed. Every 3 days     No current facility-administered medications for this visit.  REVIEW OF SYSTEMS (Negative unless checked)  Constitutional: [] Weight loss  [] Fever  [] Chills Cardiac: [] Chest pain   [] Chest pressure   [] Palpitations   [] Shortness of breath when laying flat   [] Shortness of breath at rest   [] Shortness of breath with exertion. Vascular:  [] Pain in legs with walking   [] Pain in legs at rest   [] Pain in legs when laying flat   [] Claudication   [] Pain in feet when walking  [] Pain in feet at rest  [] Pain in feet when laying flat   [x] History of DVT   [x] Phlebitis   [x] Swelling in legs   [] Varicose veins   [x] Non-healing ulcers Pulmonary:   [] Uses home oxygen   [] Productive cough   [] Hemoptysis   [] Wheeze  [] COPD   [] Asthma Neurologic:  [] Dizziness  [] Blackouts   [] Seizures   [x] History of stroke   [] History of TIA  [] Aphasia   [] Temporary blindness    [] Dysphagia   [x] Weakness or numbness in arms   [x] Weakness or numbness in legs Musculoskeletal:  [] Arthritis   [] Joint swelling   [] Joint pain   [] Low back pain Hematologic:  [] Easy bruising  [] Easy bleeding   [] Hypercoagulable state   [] Anemic  [] Hepatitis Gastrointestinal:  [] Blood in stool   [] Vomiting blood  [] Gastroesophageal reflux/heartburn   [] Abdominal pain Genitourinary:  [] Chronic kidney disease   [] Difficult urination  [] Frequent urination  [] Burning with urination   [] Hematuria Skin:  [] Rashes   [x] Ulcers   [x] Wounds Psychological:  [] History of anxiety   []  History of major depression.    Physical Exam BP (!) 147/81 (BP Location: Left Arm)   Pulse 63   Resp 16  Gen:  WD/WN, NAD Head: [place]/AT, No temporalis wasting.  Ear/Nose/Throat: Hearing grossly intact, nares w/o erythema or drainage, oropharynx w/o Erythema/Exudate Eyes: Conjunctiva clear, sclera non-icteric  Neck: trachea midline.  No JVD.  Pulmonary:  Good air movement, respirations not labored, no use of accessory muscles  Cardiac: RRR, no JVD Vascular:  Vessel Right Left  Radial Palpable Palpable                          DP NP 2+  PT NP 1+   Gastrointestinal:. No masses, surgical incisions, or scars. Musculoskeletal: Dense right hemiparesis.  Extremities without ischemic changes.  In a wheelchair.  Right leg with 3+ edema and superficial ulcerations on the anterior lower leg.  Left leg with 1+ edema. Neurologic: Sensation markedly diminished in the right arm and leg.  Speech is almost nonexistent.  Motor exam as listed above. Psychiatric: Judgment intact, Mood & affect appropriate for pt's clinical situation. Dermatologic: Superficial wounds on the right lower leg in the anterior shin area with mild surrounding erythema and serous drainage    Radiology No results found.  Labs Recent Results (from the past 2160 hour(s))  Basic metabolic panel     Status: Abnormal   Collection Time: 01/31/20  1:49 PM   Result Value Ref Range   Sodium 141 135 - 145 mmol/L   Potassium 3.8 3.5 - 5.1 mmol/L   Chloride 104 98 - 111 mmol/L   CO2 28 22 - 32 mmol/L   Glucose, Bld 109 (H) 70 - 99 mg/dL    Comment: Glucose reference range applies only to samples taken after fasting for at least 8 hours.   BUN 18 6 - 20 mg/dL   Creatinine, Ser 0.61 - 1.24 mg/dL   Calcium 8.8 (L) 8.9 - 10.3 mg/dL  GFR, Estimated >60 >60 mL/min    Comment: (NOTE) Calculated using the CKD-EPI Creatinine Equation (2021)    Anion gap 9 5 - 15    Comment: Performed at [facility], [address] [place], [place] [address]  CBC     Status: None   Collection Time: 01/31/20  1:49 PM  Result Value Ref Range   WBC 9.7 4.0 - 10.5 K/uL   RBC 4.43 4.22 - 5.81 MIL/uL   Hemoglobin 13.6 13.0 - 17.0 g/dL   HCT 60.442.2 54.039.0 - 98.152.0 %   MCV 95.3 80.0 - 100.0 fL   MCH 30.7 26.0 - 34.0 pg   MCHC 32.2 30.0 - 36.0 g/dL   RDW 19.112.9 47.811.5 - 29.515.5 %   Platelets 320 150 - 400 K/uL   nRBC 0.0 0.0 - 0.2 %    Comment: Performed at [facility], [address] [place], [place] [address]  Resp Panel by RT-PCR (Flu A&B, Covid) Nasopharyngeal Swab     Status: None   Collection Time: 01/31/20  8:01 PM   Specimen: Nasopharyngeal Swab; Nasopharyngeal(NP) swabs in vial transport medium  Result Value Ref Range   SARS Coronavirus 2 by RT PCR NEGATIVE NEGATIVE    Comment: (NOTE) SARS-CoV-2 target nucleic acids are NOT DETECTED.  The SARS-CoV-2 RNA is generally detectable in upper respiratory specimens during the acute phase of infection. The lowest concentration of SARS-CoV-2 viral copies this assay can detect is 138 copies/mL. A negative result does not preclude SARS-Cov-2 infection and should not be used as the sole basis for treatment or other patient management decisions. A negative result may occur with  improper specimen collection/handling, submission of specimen other than nasopharyngeal swab, presence of viral mutation(s)  within the areas targeted by this assay, and inadequate number of viral copies(<138 copies/mL). A negative result must be combined with clinical observations, patient history, and epidemiological information. The expected result is Negative.  Fact Sheet for Patients:  BloggerCourse.comhttps://www.fda.gov/media/152166/download  Fact Sheet for Healthcare Providers:  SeriousBroker.ithttps://www.fda.gov/media/152162/download  This test is no t yet approved or cleared by the [place] FDA and  has been authorized for detection and/or diagnosis of SARS-CoV-2 by FDA under an Emergency Use Authorization (EUA). This EUA will remain  in effect (meaning this test can be used) for the duration of the COVID-19 declaration under Section 564(b)(1) of the Act, 21 U.S.C.section 360bbb-3(b)(1), unless the authorization is terminated  or revoked sooner.       Influenza A by PCR NEGATIVE NEGATIVE   Influenza B by PCR NEGATIVE NEGATIVE    Comment: (NOTE) The Xpert Xpress SARS-CoV-2/FLU/RSV plus assay is intended as an aid in the diagnosis of influenza from Nasopharyngeal swab specimens and should not be used as a sole basis for treatment. Nasal washings and aspirates are unacceptable for Xpert Xpress SARS-CoV-2/FLU/RSV testing.  Fact Sheet for Patients: BloggerCourse.comhttps://www.fda.gov/media/152166/download  Fact Sheet for Healthcare Providers: SeriousBroker.ithttps://www.fda.gov/media/152162/download  This test is not yet approved or cleared by the [place] FDA and has been authorized for detection and/or diagnosis of SARS-CoV-2 by FDA under an Emergency Use Authorization (EUA). This EUA will remain in effect (meaning this test can be used) for the duration of the COVID-19 declaration under Section 564(b)(1) of the Act, 21 U.S.C. section 360bbb-3(b)(1), unless the authorization is terminated or revoked.  Performed at [facility], [address] [place], [place] [address]     Assessment/Plan:  Lower limb ulcer, calf, right,  limited to breakdown of skin (HCC) The right leg  was wrapped in an [employer] today.  3 layer Unna boot was placed today and should be changed weekly for the next 3 to 4 weeks.  Stroke [facility]) The patient had a fairly devastating stroke leaving him right hemiparetic.  I suspect that his DVT was actually shortly after the stroke and this is on his affected leg which she cannot move.  He would be at high risk of recurrent DVT if not on anticoagulation due to his immobility.  I would agreed with continuous Eliquis therapy.  DVT (deep venous thrombosis) (HCC) Although this is not well defined device duplex the hospital, the fact that is nonocclusive in the femoral and popliteal veins would suggest a more chronic process.  Either way, given his immobility with stroke it is appropriate to place him on anticoagulation and Eliquis will be used.  After several weeks in an [employer], we will evaluate him for compression garment of some sort.  Also would like to do a venous reflux study for further evaluation of the venous system on the right.  Return in several weeks.  Weekly Unna boots      Festus Barren 03/07/2020, 2:30 PM   This note was created with Dragon medical transcription system.  Any errors from dictation are unintentional.

## 2020-03-07 NOTE — Assessment & Plan Note (Signed)
The patient had a fairly devastating stroke leaving him right hemiparetic.  I suspect that his DVT was actually shortly after the stroke and this is on his affected leg which she cannot move.  He would be at high risk of recurrent DVT if not on anticoagulation due to his immobility.  I would agreed with continuous Eliquis therapy.

## 2020-03-07 NOTE — Assessment & Plan Note (Signed)
The right leg was wrapped in an [employer] today.  3 layer Unna boot was placed today and should be changed weekly for the next 3 to 4 weeks.

## 2020-03-14 ENCOUNTER — Other Ambulatory Visit: Payer: Self-pay

## 2020-03-14 ENCOUNTER — Encounter (INDEPENDENT_AMBULATORY_CARE_PROVIDER_SITE_OTHER): Payer: Self-pay

## 2020-03-14 ENCOUNTER — Ambulatory Visit (INDEPENDENT_AMBULATORY_CARE_PROVIDER_SITE_OTHER): Payer: Medicaid Other | Admitting: Nurse Practitioner

## 2020-03-14 VITALS — BP 120/79 | HR 67 | Resp 16

## 2020-03-14 DIAGNOSIS — L97211 Non-pressure chronic ulcer of right calf limited to breakdown of skin: Secondary | ICD-10-CM | POA: Diagnosis not present

## 2020-03-14 NOTE — Progress Notes (Signed)
History of Present Illness  There is no documented history at this time  Assessments & Plan   There are no diagnoses linked to this encounter.    Additional instructions  Subjective:  Patient presents with venous ulcer of the Right lower extremity.    Procedure:  3 layer unna wrap was placed Right lower extremity.   Plan:   Follow up in one week.   

## 2020-03-19 ENCOUNTER — Encounter (INDEPENDENT_AMBULATORY_CARE_PROVIDER_SITE_OTHER): Payer: Self-pay | Admitting: Nurse Practitioner

## 2020-03-21 ENCOUNTER — Ambulatory Visit (INDEPENDENT_AMBULATORY_CARE_PROVIDER_SITE_OTHER): Payer: Medicaid Other | Admitting: Nurse Practitioner

## 2020-03-21 ENCOUNTER — Encounter (INDEPENDENT_AMBULATORY_CARE_PROVIDER_SITE_OTHER): Payer: Self-pay | Admitting: Nurse Practitioner

## 2020-03-21 ENCOUNTER — Other Ambulatory Visit: Payer: Self-pay

## 2020-03-21 VITALS — BP 148/59 | HR 56 | Resp 16

## 2020-03-21 DIAGNOSIS — L97211 Non-pressure chronic ulcer of right calf limited to breakdown of skin: Secondary | ICD-10-CM | POA: Diagnosis not present

## 2020-03-21 NOTE — Progress Notes (Signed)
History of Present Illness  There is no documented history at this time  Assessments & Plan   There are no diagnoses linked to this encounter.    Additional instructions  Subjective:  Patient presents with venous ulcer of the Right lower extremity.    Procedure:  3 layer unna wrap was placed Right lower extremity.   Plan:   Follow up in one week.   

## 2020-03-28 ENCOUNTER — Other Ambulatory Visit: Payer: Self-pay

## 2020-03-28 ENCOUNTER — Ambulatory Visit (INDEPENDENT_AMBULATORY_CARE_PROVIDER_SITE_OTHER): Payer: Medicaid Other

## 2020-03-28 ENCOUNTER — Encounter (INDEPENDENT_AMBULATORY_CARE_PROVIDER_SITE_OTHER): Payer: Self-pay | Admitting: Nurse Practitioner

## 2020-03-28 ENCOUNTER — Ambulatory Visit (INDEPENDENT_AMBULATORY_CARE_PROVIDER_SITE_OTHER): Payer: Medicaid Other | Admitting: Nurse Practitioner

## 2020-03-28 VITALS — BP 145/86 | HR 60 | Resp 16

## 2020-03-28 DIAGNOSIS — I82411 Acute embolism and thrombosis of right femoral vein: Secondary | ICD-10-CM

## 2020-03-28 DIAGNOSIS — I639 Cerebral infarction, unspecified: Secondary | ICD-10-CM

## 2020-03-28 DIAGNOSIS — L97211 Non-pressure chronic ulcer of right calf limited to breakdown of skin: Secondary | ICD-10-CM

## 2020-04-02 ENCOUNTER — Encounter (INDEPENDENT_AMBULATORY_CARE_PROVIDER_SITE_OTHER): Payer: Self-pay | Admitting: Nurse Practitioner

## 2020-04-02 NOTE — Progress Notes (Signed)
Subjective:    Patient ID: Jonathan Newman, male    DOB: Jun 14, 1960, 60 y.o.   MRN: [id number] Chief Complaint  Patient presents with  . Follow-up    Ultrasound and unna wrap follow up    Jonathan Newman is a 60 y.o. male.  He returns today for follow-up evaluation after having a right leg DVT with pain and ulceration to the right leg as well.  The patient had a stroke approximately a year ago and remains with right-sided paralysis.  The patient unfortunately also has aphasia which makes it difficult to communicate.  The patient has his sister with relates this all the claudication.  The patient was started on Eliquis which actually helped the swelling at first which caused concern for new DVT.  The patient also had swelling with a small ulceration with weeping.  Her legs look much better.  His sister notes that her brother seems to feel better.  No further drainage.  He denies any shortness of breath or chest pain.  Today noninvasive study showed no evidence of DVT or superficial thrombophlebitis.  It does show evidence of right deep venous insufficiency in the common femoral vein as well as reflux in the great saphenous vein at the saphenofemoral junction.      Review of Systems  Cardiovascular: Positive for leg swelling.  Neurological: Positive for weakness.  All other systems reviewed and are negative.      Objective:   Physical Exam Vitals reviewed.  HENT:     Head: Normocephalic.  Cardiovascular:     Rate and Rhythm: Normal rate.     Pulses: Normal pulses.  Pulmonary:     Effort: Pulmonary effort is normal.  Musculoskeletal:     Right lower leg: Edema present.  Neurological:     Mental Status: He is alert and oriented to person, place, and time.     Motor: Weakness present.     Gait: Gait abnormal.  Psychiatric:        Mood and Affect: Mood normal.        Speech: He is noncommunicative (aphasia).        Behavior: Behavior normal.        Thought Content: Thought content  normal.        Judgment: Judgment normal.     BP (!) 145/86 (BP Location: Left Arm)   Pulse 60   Resp 16   Past Medical History:  Diagnosis Date  . Constipation   . CVA (cerebral vascular accident) (HCC)   . Diarrhea   . Dysarthria and anarthria   . Hemiplegia and hemiparesis following cerebral infarction affecting right dominant side (HCC)   . Hyperlipidemia   . Pain in right leg     Social History   Socioeconomic History  . Marital status: Single    Spouse name: Not on file  . Number of children: Not on file  . Years of education: Not on file  . Highest education level: Not on file  Occupational History  . Not on file  Tobacco Use  . Smoking status: Former Games developer  . Smokeless tobacco: Never Used  Substance and Sexual Activity  . Alcohol use: Not Currently  . Drug use: Not Currently  . Sexual activity: Not on file  Other Topics Concern  . Not on file  Social History Narrative  . Not on file   Social Determinants of Health   Financial Resource Strain: Not on file  Food Insecurity: Not on file  Transportation  Needs: Not on file  Physical Activity: Not on file  Stress: Not on file  Social Connections: Not on file  Intimate Partner Violence: Not on file    History reviewed. No pertinent surgical history.  History reviewed. No pertinent family history.  No Known Allergies  CBC Latest Ref Rng & Units 01/31/2020  WBC 4.0 - 10.5 K/uL 9.7  Hemoglobin 13.0 - 17.0 g/dL 40.9  Hematocrit 81.1 - 52.0 % 42.2  Platelets 150 - 400 K/uL 320      CMP     Component Value Date/Time   NA 141 01/31/2020 1349   K 3.8 01/31/2020 1349   CL 104 01/31/2020 1349   CO2 28 01/31/2020 1349   GLUCOSE 109 (H) 01/31/2020 1349   BUN 18 01/31/2020 1349   CREATININE 0.94 01/31/2020 1349   CALCIUM 8.8 (L) 01/31/2020 1349   GFRNONAA >60 01/31/2020 1349     No results found.     Assessment & Plan:   1. Deep vein thrombosis (DVT) of femoral vein of right lower extremity,  unspecified chronicity (HCC) Noninvasive studies did show evidence of deep venous insufficiency in the common femoral vein as well as at the saphenofemoral junction.  Discussed the role of medical grade 1 compression stockings for control of edema.  Due to the patient's right-sided paralysis and will be difficult for him to do it along with he returns home.  Therefore I have suggested compression wraps and states that may be easier to utilize.  Elevation is also advised when possible.  We will have the patient return in 3 months to evaluate if a lymphedema pump may be of use.  2. Lower limb ulcer, calf, right, limited to breakdown of skin (HCC) The wound is currently healed.  Patient will follow with conservative therapy as outlined above.  3. Cerebrovascular accident (CVA), unspecified mechanism (HCC) Unfortunately the patient stroke has left him with right-sided paralysis.  We have a suspicion that the DVT came right after his stroke due to immobility.  The patient is still marginally immobile and therefore lifelong anticoagulation is certainly not unreasonable.   Current Outpatient Medications on File Prior to Visit  Medication Sig Dispense Refill  . ACETAMINOPHEN PO Take 650 mg by mouth every 6 (six) hours as needed.    . APIXABAN (ELIQUIS) VTE STARTER PACK (10MG  AND 5MG ) Take as directed on package: start with two-5mg  tablets twice daily for 7 days. On day 8, switch to one-5mg  tablet twice daily. 1 each 0  . aspirin 325 MG EC tablet Take 325 mg by mouth daily.    atorvastatin (LIPITOR) 40 MG tablet Take 40 mg by mouth at bedtime.    . divalproex (DEPAKOTE) 125 MG DR tablet Take 125 mg by mouth at bedtime.    . divalproex (DEPAKOTE) 125 MG DR tablet Take 125 mg by mouth at bedtime.    lisinopril (ZESTRIL) 10 MG tablet Take 20 mg by mouth daily.    . ondansetron (ZOFRAN ODT) 4 MG disintegrating tablet Take 1 tablet (4 mg total) by mouth every 8 (eight) hours as needed for nausea or  vomiting. 20 tablet 0  . ondansetron (ZOFRAN) 4 MG tablet Take 4 mg by mouth every 8 (eight) hours as needed for nausea or vomiting.    [name] PARoxetine (PAXIL) 20 MG tablet Take 20 mg by mouth daily.    . sennosides-docusate sodium (SENOKOT-S) 8.6-50 MG tablet Take 1 tablet by mouth as directed. Every 3 days    .  cephALEXin (KEFLEX) 500 MG capsule Take 500 mg by mouth 3 (three) times daily. (Patient not taking: Reported on 03/28/2020)     No current facility-administered medications on file prior to visit.    There are no Patient Instructions on file for this visit. No follow-ups on file.   Georgiana Spinner, NP

## 2020-05-11 ENCOUNTER — Telehealth (INDEPENDENT_AMBULATORY_CARE_PROVIDER_SITE_OTHER): Payer: Self-pay

## 2020-05-11 NOTE — Telephone Encounter (Signed)
Patient sister called concern with leg swelling and stated that when she takes the farrow wrap off the leg still swollen. Patient will come in tomorrow for unna wrap.

## 2020-05-12 ENCOUNTER — Ambulatory Visit (INDEPENDENT_AMBULATORY_CARE_PROVIDER_SITE_OTHER): Payer: Medicaid Other | Admitting: Nurse Practitioner

## 2020-05-12 ENCOUNTER — Encounter (INDEPENDENT_AMBULATORY_CARE_PROVIDER_SITE_OTHER): Payer: Self-pay

## 2020-05-12 ENCOUNTER — Other Ambulatory Visit: Payer: Self-pay

## 2020-05-12 VITALS — BP 138/81 | HR 60 | Resp 16

## 2020-05-12 DIAGNOSIS — L97211 Non-pressure chronic ulcer of right calf limited to breakdown of skin: Secondary | ICD-10-CM | POA: Diagnosis not present

## 2020-05-12 NOTE — Progress Notes (Signed)
History of Present Illness  There is no documented history at this time  Assessments & Plan   There are no diagnoses linked to this encounter.    Additional instructions  Subjective:  Patient presents with venous ulcer of the Right lower extremity.    Procedure:  3 layer unna wrap was placed Right lower extremity.   Plan:   Follow up in one week.   

## 2020-05-13 ENCOUNTER — Encounter (INDEPENDENT_AMBULATORY_CARE_PROVIDER_SITE_OTHER): Payer: Self-pay | Admitting: Nurse Practitioner

## 2020-05-19 ENCOUNTER — Ambulatory Visit (INDEPENDENT_AMBULATORY_CARE_PROVIDER_SITE_OTHER): Payer: Medicaid Other | Admitting: Nurse Practitioner

## 2020-05-19 ENCOUNTER — Other Ambulatory Visit: Payer: Self-pay

## 2020-05-19 VITALS — BP 133/86 | HR 60 | Ht 68.0 in | Wt 222.0 lb

## 2020-05-19 DIAGNOSIS — L97211 Non-pressure chronic ulcer of right calf limited to breakdown of skin: Secondary | ICD-10-CM

## 2020-05-19 NOTE — Progress Notes (Signed)
History of Present Illness  There is no documented history at this time  Assessments & Plan   There are no diagnoses linked to this encounter.    Additional instructions  Subjective:  Patient presents with venous ulcer of the Right lower extremity.    Procedure:  3 layer unna wrap was placed Right lower extremity.   Plan:   Follow up in one week.   

## 2020-05-21 ENCOUNTER — Encounter (INDEPENDENT_AMBULATORY_CARE_PROVIDER_SITE_OTHER): Payer: Self-pay | Admitting: Nurse Practitioner

## 2020-05-26 ENCOUNTER — Other Ambulatory Visit: Payer: Self-pay

## 2020-05-26 ENCOUNTER — Ambulatory Visit (INDEPENDENT_AMBULATORY_CARE_PROVIDER_SITE_OTHER): Payer: Medicaid Other | Admitting: Nurse Practitioner

## 2020-05-26 ENCOUNTER — Encounter (INDEPENDENT_AMBULATORY_CARE_PROVIDER_SITE_OTHER): Payer: Self-pay

## 2020-05-26 VITALS — BP 153/94 | HR 49 | Ht 68.0 in | Wt 222.0 lb

## 2020-05-26 DIAGNOSIS — L97211 Non-pressure chronic ulcer of right calf limited to breakdown of skin: Secondary | ICD-10-CM

## 2020-05-26 NOTE — Progress Notes (Signed)
History of Present Illness  There is no documented history at this time  Assessments & Plan   There are no diagnoses linked to this encounter.    Additional instructions  Subjective:  Patient presents with venous ulcer of the Right lower extremity.    Procedure:  3 layer unna wrap was placed Right lower extremity.   Plan:   Follow up in one week.   

## 2020-05-27 ENCOUNTER — Encounter (INDEPENDENT_AMBULATORY_CARE_PROVIDER_SITE_OTHER): Payer: Self-pay | Admitting: Nurse Practitioner

## 2020-06-02 ENCOUNTER — Encounter (INDEPENDENT_AMBULATORY_CARE_PROVIDER_SITE_OTHER): Payer: Self-pay | Admitting: Nurse Practitioner

## 2020-06-02 ENCOUNTER — Other Ambulatory Visit: Payer: Self-pay

## 2020-06-02 ENCOUNTER — Ambulatory Visit (INDEPENDENT_AMBULATORY_CARE_PROVIDER_SITE_OTHER): Payer: Medicaid Other | Admitting: Nurse Practitioner

## 2020-06-02 VITALS — BP 139/83 | HR 56 | Ht 68.0 in | Wt 222.0 lb

## 2020-06-02 DIAGNOSIS — I639 Cerebral infarction, unspecified: Secondary | ICD-10-CM

## 2020-06-02 DIAGNOSIS — L97211 Non-pressure chronic ulcer of right calf limited to breakdown of skin: Secondary | ICD-10-CM | POA: Diagnosis not present

## 2020-06-03 ENCOUNTER — Encounter (INDEPENDENT_AMBULATORY_CARE_PROVIDER_SITE_OTHER): Payer: Self-pay | Admitting: Nurse Practitioner

## 2020-06-03 NOTE — Progress Notes (Signed)
Subjective:    Patient ID: Jonathan Newman, male    DOB: [birth year], 60 y.o.   MRN: [id number] Chief Complaint  Patient presents with  . Follow-up    F/U Rt unna     Jonathan Newman is a 60 year old male that presents today for evaluation of lower extremity edema.  The patient has had a previous stroke and has right-sided hemiparesis.  The patient has been utilizing [place] wraps however he still continues to have swelling and the swelling results and indentions in his leg.  These have been painful for the patient.  We have placed him into Unna wraps for several weeks and the swelling also subsided.  The patient also continued elevating his lower extremities much as possible.  Due to his hemiparesis exercise is not possible.  He denies any lower wounds or ulcerations.  The patient's sister is here she provides much of the history as the patient is largely nonverbal although he does understand what is communicated.   Review of Systems  Cardiovascular: Positive for leg swelling.  All other systems reviewed and are negative.      Objective:   Physical Exam Vitals reviewed.  HENT:     Head: Normocephalic.  Cardiovascular:     Rate and Rhythm: Normal rate.     Pulses: Normal pulses.  Pulmonary:     Effort: Pulmonary effort is normal.  Musculoskeletal:     Right lower leg: No edema.  Neurological:     Mental Status: He is alert and oriented to person, place, and time. Mental status is at baseline.     Motor: Weakness present.     Coordination: Coordination abnormal.  Psychiatric:        Mood and Affect: Mood normal.        Behavior: Behavior normal.        Thought Content: Thought content normal.        Judgment: Judgment normal.     BP 139/83   Pulse (!) 56   Ht 5\' 8"  (1.727 m)   Wt 222 lb (100.7 kg)   BMI 33.75 kg/m   Past Medical History:  Diagnosis Date  . Constipation   . CVA (cerebral vascular accident) (HCC)   . Diarrhea   . Dysarthria and anarthria   . Hemiplegia  and hemiparesis following cerebral infarction affecting right dominant side (HCC)   . Hyperlipidemia   . Pain in right leg     Social History   Socioeconomic History  . Marital status: Single    Spouse name: Not on file  . Number of children: Not on file  . Years of education: Not on file  . Highest education level: Not on file  Occupational History  . Not on file  Tobacco Use  . Smoking status: Former  . Smokeless tobacco: Never Used  Substance and Sexual Activity  . Alcohol use: Not Currently  . Drug use: Not Currently  . Sexual activity: Not on file  Other Topics Concern  . Not on file  Social History Narrative  . Not on file   Social Determinants of Health   Financial Resource Strain: Not on file  Food Insecurity: Not on file  Transportation Needs: Not on file  Physical Activity: Not on file  Stress: Not on file  Social Connections: Not on file  Intimate Partner Violence: Not on file    History reviewed. No pertinent surgical history.  History reviewed. No pertinent family history.  No Known Allergies  CBC  Latest Ref Rng & Units 01/31/2020  WBC 4.0 - 10.5 K/uL 9.7  Hemoglobin 13.0 - 17.0 g/dL 20.2  Hematocrit 54.2 - 52.0 % 42.2  Platelets 150 - 400 K/uL 320      CMP     Component Value Date/Time   NA 141 01/31/2020 1349   K 3.8 01/31/2020 1349   CL 104 01/31/2020 1349   CO2 28 01/31/2020 1349   GLUCOSE 109 (H) 01/31/2020 1349   BUN 18 01/31/2020 1349   CREATININE 0.94 01/31/2020 1349   CALCIUM 8.8 (L) 01/31/2020 1349   GFRNONAA >60 01/31/2020 1349     No results found.     Assessment & Plan:   1. Lower limb ulcer, calf, right, limited to breakdown of skin [facility]) The patient will continue to wear his Fabian November wraps but we will also introduce very mild compressive device underneath to try to help with the dimpling that can occur with these devices.  Recommend:  No surgery or intervention at this point in time.    I have reviewed my  previous discussion with the patient regarding swelling and why it causes symptoms.  Patient will continue wearing graduated compression stockings class 1 (20-30 mmHg) on a daily basis. The patient will  beginning wearing the stockings first thing in the morning and removing them in the evening. The patient is instructed specifically not to sleep in the stockings.    In addition, behavioral modification including several periods of elevation of the lower extremities during the day will be continued.  This was reviewed with the patient during the initial visit.  The patient will also continue routine exercise, especially walking on a daily basis as was discussed during the initial visit.    Despite conservative treatments for well over 4 weeks, including graduated compression therapy class 1 and behavioral modification including exercise and elevation the patient  has not obtained adequate control of the lymphedema.  The patient still has stage 3 lymphedema and therefore, I believe that a lymph pump should be added to improve the control of the patient's lymphedema.  Additionally, a lymph pump is warranted because it will reduce the risk of cellulitis and ulceration in the future.  Patient should follow-up in 3 months    2. Cerebrovascular accident (CVA), unspecified mechanism (HCC) The patient's right lower extremity paralysis does worsen his lower extremity edema.  Following conservative therapy should help with continued swelling.   Current Outpatient Medications on File Prior to Visit  Medication Sig Dispense Refill  . ACETAMINOPHEN PO Take 650 mg by mouth every 6 (six) hours as needed.    . APIXABAN (ELIQUIS) VTE STARTER PACK (10MG  AND 5MG ) Take as directed on package: start with two-5mg  tablets twice daily for 7 days. On day 8, switch to one-5mg  tablet twice daily. 1 each 0  . aspirin 325 MG EC tablet Take 325 mg by mouth daily.    atorvastatin (LIPITOR) 40 MG tablet Take 40 mg by mouth at  bedtime.    . cephALEXin (KEFLEX) 500 MG capsule Take 500 mg by mouth 3 (three) times daily.    . divalproex (DEPAKOTE SPRINKLE) 125 MG capsule Take by mouth at bedtime.    . divalproex (DEPAKOTE) 125 MG DR tablet Take 125 mg by mouth at bedtime.    . divalproex (DEPAKOTE) 125 MG DR tablet Take 125 mg by mouth at bedtime.    lisinopril (ZESTRIL) 10 MG tablet Take 20 mg by mouth daily.    [name] lisinopril (  ZESTRIL) 20 MG tablet     . ondansetron (ZOFRAN ODT) 4 MG disintegrating tablet Take 1 tablet (4 mg total) by mouth every 8 (eight) hours as needed for nausea or vomiting. 20 tablet 0  . ondansetron (ZOFRAN) 4 MG tablet Take 4 mg by mouth every 8 (eight) hours as needed for nausea or vomiting.    [name] PARoxetine (PAXIL) 20 MG tablet Take 20 mg by mouth daily.    . sennosides-docusate sodium (SENOKOT-S) 8.6-50 MG tablet Take 1 tablet by mouth as directed. Every 3 days     No current facility-administered medications on file prior to visit.    There are no Patient Instructions on file for this visit. No follow-ups on file.   Georgiana Spinner, NP

## 2020-06-06 ENCOUNTER — Emergency Department: Payer: Medicaid Other

## 2020-06-06 ENCOUNTER — Emergency Department
Admission: EM | Admit: 2020-06-06 | Discharge: 2020-06-07 | Disposition: A | Discharge status: Home / Self Care | Payer: Medicaid Other | Attending: Emergency Medicine | Admitting: Emergency Medicine

## 2020-06-06 ENCOUNTER — Other Ambulatory Visit: Payer: Self-pay

## 2020-06-06 ENCOUNTER — Encounter: Payer: Self-pay | Admitting: *Deleted

## 2020-06-06 DIAGNOSIS — Z87891 Personal history of nicotine dependence: Secondary | ICD-10-CM | POA: Diagnosis not present

## 2020-06-06 DIAGNOSIS — N200 Calculus of kidney: Secondary | ICD-10-CM

## 2020-06-06 DIAGNOSIS — Z8673 Personal history of transient ischemic attack (TIA), and cerebral infarction without residual deficits: Secondary | ICD-10-CM | POA: Diagnosis not present

## 2020-06-06 DIAGNOSIS — R319 Hematuria, unspecified: Secondary | ICD-10-CM | POA: Diagnosis present

## 2020-06-06 DIAGNOSIS — Z79899 Other long term (current) drug therapy: Secondary | ICD-10-CM | POA: Diagnosis not present

## 2020-06-06 DIAGNOSIS — Z7901 Long term (current) use of anticoagulants: Secondary | ICD-10-CM | POA: Insufficient documentation

## 2020-06-06 DIAGNOSIS — Z7982 Long term (current) use of aspirin: Secondary | ICD-10-CM | POA: Diagnosis not present

## 2020-06-06 DIAGNOSIS — Z86718 Personal history of other venous thrombosis and embolism: Secondary | ICD-10-CM | POA: Insufficient documentation

## 2020-06-06 DIAGNOSIS — N202 Calculus of kidney with calculus of ureter: Secondary | ICD-10-CM | POA: Diagnosis not present

## 2020-06-06 LAB — COMPREHENSIVE METABOLIC PANEL
ALT: 32 U/L (ref 0–44)
AST: 23 U/L (ref 15–41)
Albumin: 4 g/dL (ref 3.5–5.0)
Alkaline Phosphatase: 84 U/L (ref 38–126)
Anion gap: 9 (ref 5–15)
BUN: 16 mg/dL (ref 6–20)
CO2: 27 mmol/L (ref 22–32)
Calcium: 9 mg/dL (ref 8.9–10.3)
Chloride: 107 mmol/L (ref 98–111)
Creatinine, Ser: 0.89 mg/dL (ref 0.61–1.24)
GFR, Estimated: >60 mL/min (ref >60)
Glucose, Bld: 96 mg/dL (ref 70–99)
Potassium: 4 mmol/L (ref 3.5–5.1)
Sodium: 143 mmol/L (ref 135–145)
Total Bilirubin: 0.6 mg/dL (ref 0.3–1.2)
Total Protein: 7.2 g/dL (ref 6.5–8.1)

## 2020-06-06 LAB — CBC WITH DIFFERENTIAL/PLATELET
Abs Immature Granulocytes: 0.02 10*3/uL (ref 0.00–0.07)
Basophils Absolute: 0.1 10*3/uL (ref 0.0–0.1)
Basophils Relative: 1 %
Eosinophils Absolute: 0.3 10*3/uL (ref 0.0–0.5)
Eosinophils Relative: 3 %
HCT: 42 % (ref 39.0–52.0)
Hemoglobin: 13.8 g/dL (ref 13.0–17.0)
Immature Granulocytes: 0 %
Lymphocytes Relative: 37 %
Lymphs Abs: 3.6 10*3/uL (ref 0.7–4.0)
MCH: 29.7 pg (ref 26.0–34.0)
MCHC: 32.9 g/dL (ref 30.0–36.0)
MCV: 90.3 fL (ref 80.0–100.0)
Monocytes Absolute: 0.8 10*3/uL (ref 0.1–1.0)
Monocytes Relative: 8 %
Neutro Abs: 5 10*3/uL (ref 1.7–7.7)
Neutrophils Relative %: 51 %
Platelets: 316 10*3/uL (ref 150–400)
RBC: 4.65 MIL/uL (ref 4.22–5.81)
RDW: 14.4 % (ref 11.5–15.5)
WBC: 9.7 10*3/uL (ref 4.0–10.5)
nRBC: 0 % (ref 0.0–0.2)

## 2020-06-06 LAB — PROTIME-INR
INR: 1.1 (ref 0.8–1.2)
Prothrombin Time: 13.8 seconds (ref 11.4–15.2)

## 2020-06-06 LAB — URINALYSIS, ROUTINE W REFLEX MICROSCOPIC
Bacteria, UA: NONE SEEN
RBC / HPF: >50 RBC/hpf — ABNORMAL HIGH (ref 0–5)
Specific Gravity, Urine: 1.025 (ref 1.005–1.030)
Squamous Epithelial / HPF: NONE SEEN (ref 0–5)

## 2020-06-06 LAB — APTT: aPTT: 33 seconds (ref 24–36)

## 2020-06-06 LAB — LIPASE, BLOOD: Lipase: 36 U/L (ref 11–51)

## 2020-06-06 IMAGING — CT CT RENAL STONE PROTOCOL
2 of 4 series · 16 of 46 positions shown, 18 images · non-contrast
Comparison: [DATE]

CLINICAL DATA: Flank pain

EXAM:
CT ABDOMEN AND PELVIS WITHOUT CONTRAST
TECHNIQUE: Multidetector CT imaging of the abdomen and pelvis was performed
following the standard protocol without IV contrast.

[Series 2: stone full standard · axial · 0.87mm/px · z∈[-453,+12]mm · 13 of 103 slices shown, 15 images]
[im 5/103  soft-tissue]
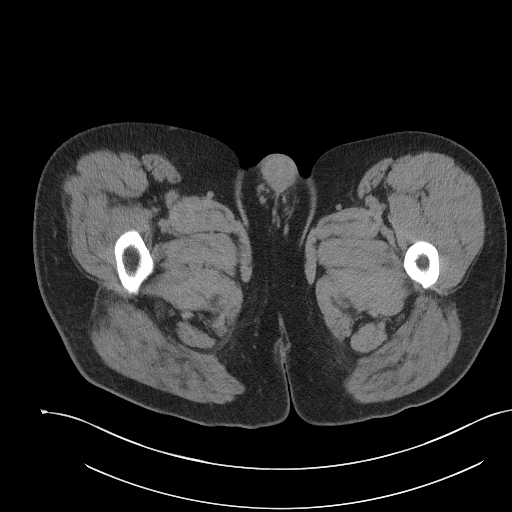
[im 5/103  bone]
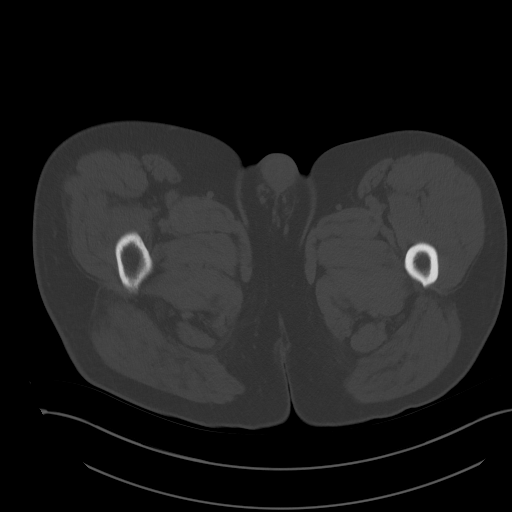
[im 13/103  soft-tissue]
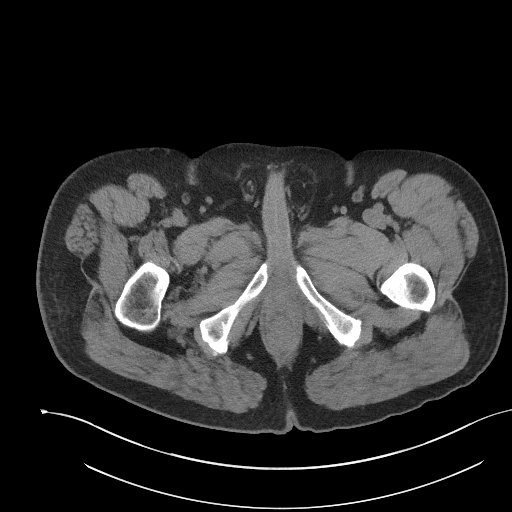
[im 22/103  soft-tissue]
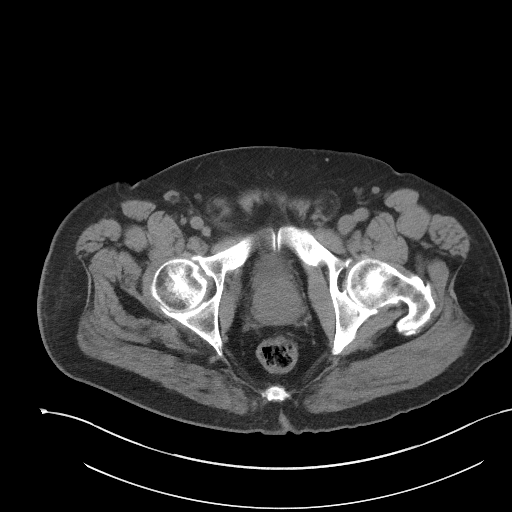
[im 30/103  soft-tissue]
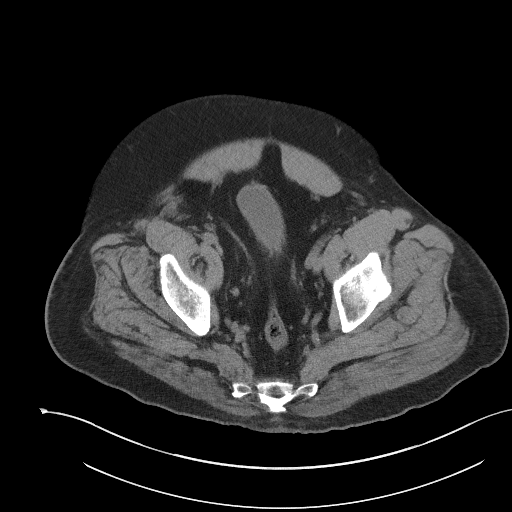
[im 35/103  soft-tissue]
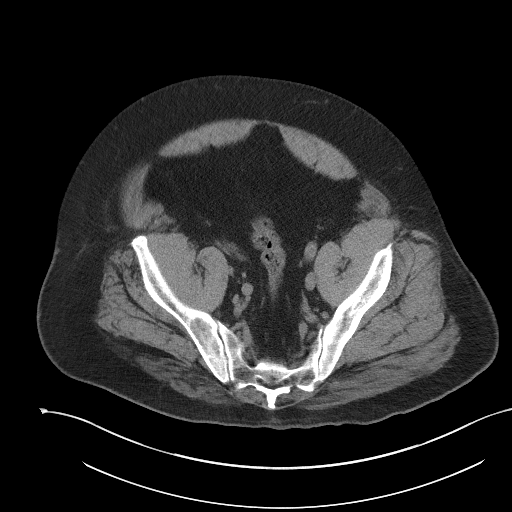
[im 43/103  soft-tissue]
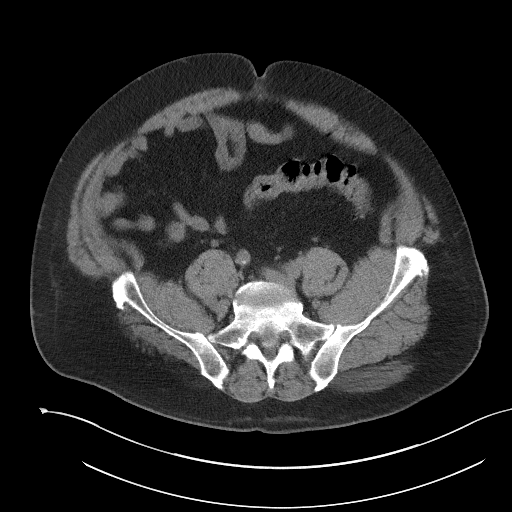
[im 52/103  soft-tissue]
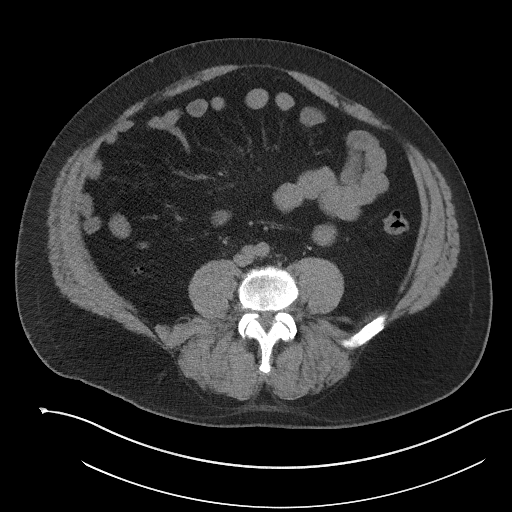
[im 60/103  soft-tissue]
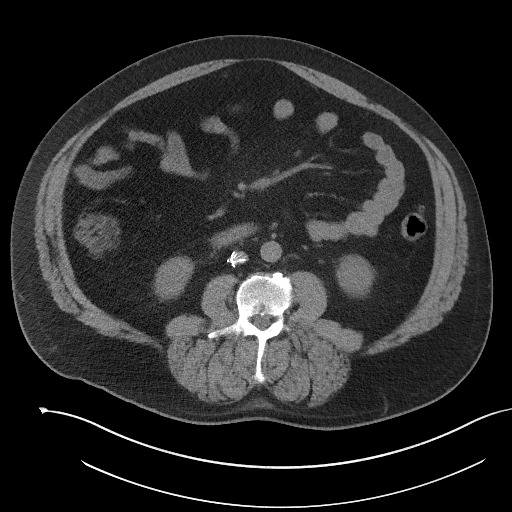
[im 69/103  soft-tissue]
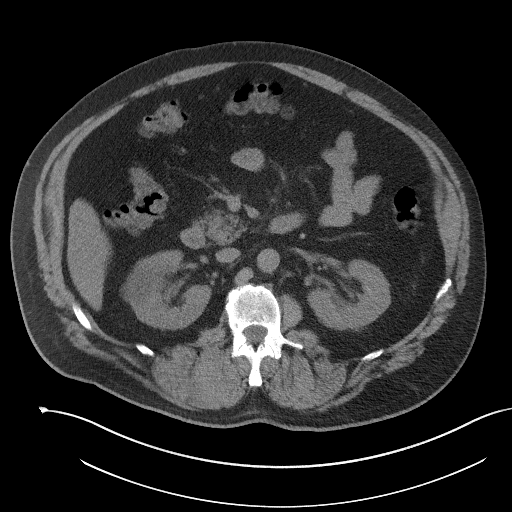
[im 69/103  bone]
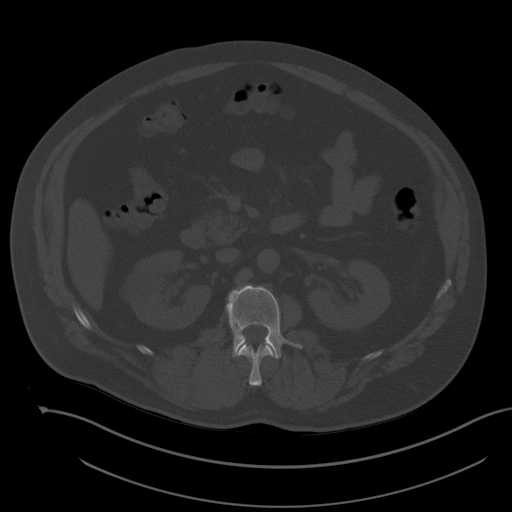
[im 73/103  soft-tissue]
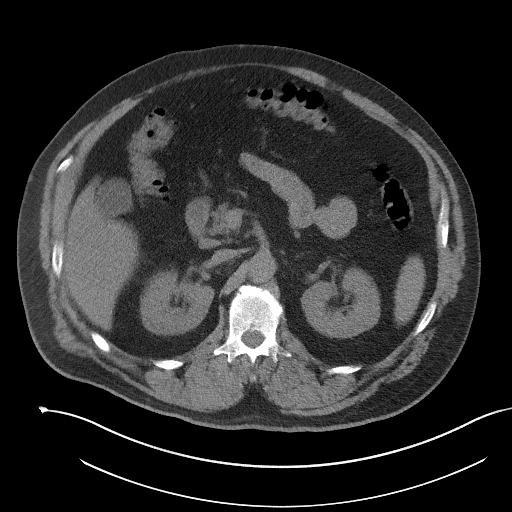
[im 81/103  soft-tissue]
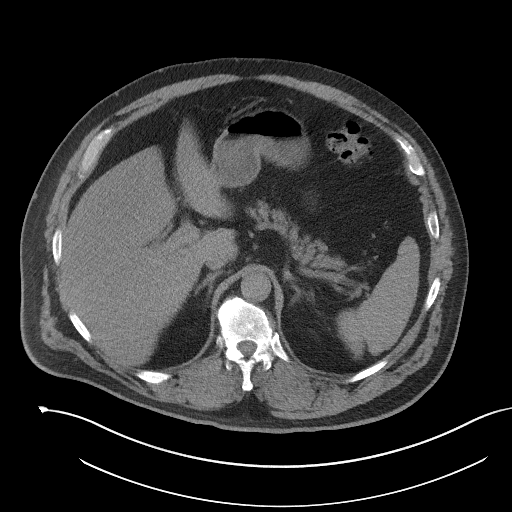
[im 90/103  soft-tissue]
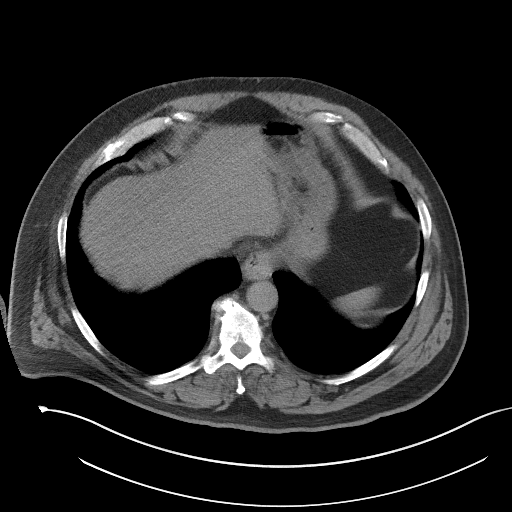
[im 98/103  soft-tissue]
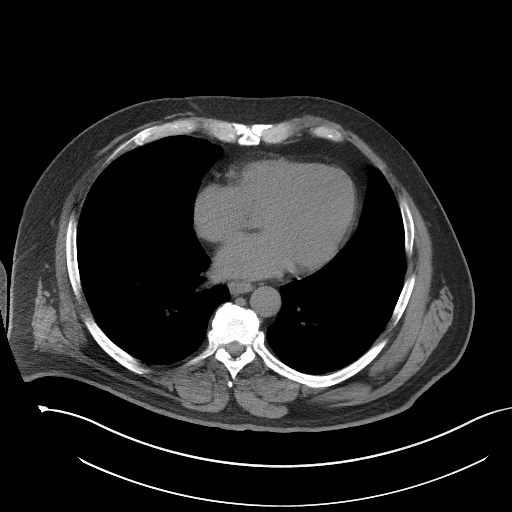

[Series 5: coronal · coronal · 0.85mm/px · 3 of 176 slices shown]
[im 59/176  soft-tissue]
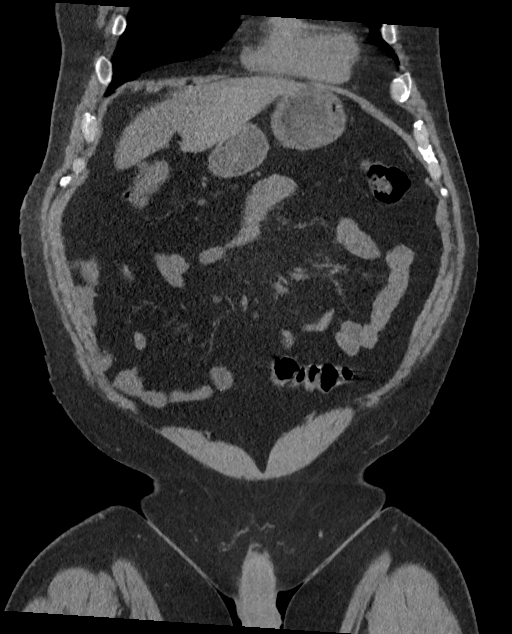
[im 78/176  soft-tissue]
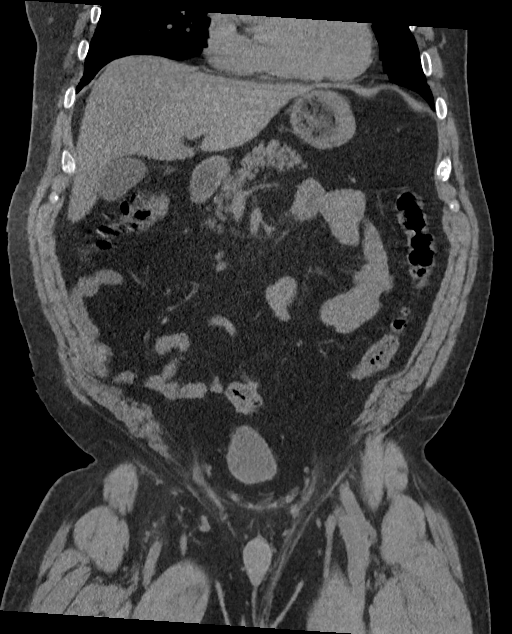
[im 98/176  soft-tissue]
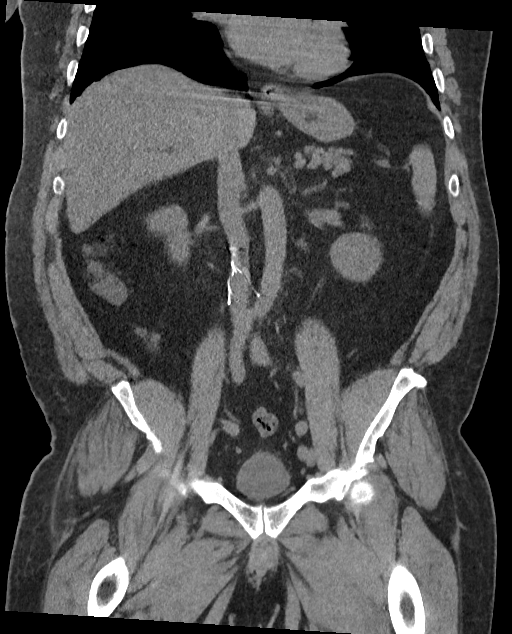

[16 of 46 positions shown; findings below may reference images not displayed]

FINDINGS: Lower chest: No acute abnormality.

Hepatobiliary: No focal liver abnormality is seen. No gallstones,
gallbladder wall thickening, or biliary dilatation.

Pancreas: Unremarkable. No pancreatic ductal dilatation or
surrounding inflammatory changes.

Spleen: Normal in size without focal abnormality.

Adrenals/Urinary Tract: Adrenal glands are within normal limits.
Kidneys are well visualized with large mid polar cyst on the right.
No obstructive changes are seen. A tiny 1-2 mm stone is noted in the
right mid ureter best seen on image number 53 of series 2 and image
number 91 of series 5. The more distal right ureter appears within
normal limits. Left ureter is unremarkable. Bladder is well
distended.

Stomach/Bowel: Scattered diverticular change of the colon is noted.
The appendix is within normal limits. No obstructive or inflammatory
changes of the small bowel or stomach are noted.

Vascular/Lymphatic: No significant vascular findings are present. No
enlarged abdominal or pelvic lymph nodes. IVC filter is noted in
place and stable.

Reproductive: Prostate is unremarkable.

Other: No abdominal wall hernia or abnormality. No abdominopelvic
ascites.

Musculoskeletal: No acute or significant osseous findings.
IMPRESSION: Tiny right mid ureteral stone without significant obstructive
change.

No other acute abnormality is noted.

## 2020-06-06 MED ORDER — SODIUM CHLORIDE 0.9 % IV BOLUS: 500.0000 mL | Freq: Once | INTRAVENOUS | Status: DC

## 2020-06-06 NOTE — ED Notes (Signed)
Pt unable to urinate.

## 2020-06-06 NOTE — ED Provider Notes (Signed)
[facility] Emergency Department Provider Note  ____________________________________________   Event Date/Time   First MD Initiated Contact with Patient 06/06/20 2029     (approximate)  I have reviewed the triage vital signs and the nursing notes.   HISTORY  Chief Complaint Hematuria    HPI Jonathan Newman is a 60 y.o. male with prior stroke with right-sided weakness and aphasia who comes in with hematuria  Patient presents with his sister who is his POA.  She reports this morning he had some nausea and vomiting and seemed to have some abdominal pain later tonight.  She noticed that he has had 2 episodes of blood in his urine, intermittent, unclear what brought it on, nothing makes it better or worse.  Denies ever having this previously.  Patient does not self cath.  The majority of his pain is in his lower abdomen over his bladder.  Patient is on Eliquis.          Past Medical History:  Diagnosis Date  . Constipation   . CVA (cerebral vascular accident) (HCC)   . Diarrhea   . Dysarthria and anarthria   . Hemiplegia and hemiparesis following cerebral infarction affecting right dominant side (HCC)   . Hyperlipidemia   . Pain in right leg     Patient Active Problem List   Diagnosis Date Noted  . Lower limb ulcer, calf, right, limited to breakdown of skin (HCC) 03/07/2020  . Stroke (HCC) 03/07/2020  . DVT (deep venous thrombosis) (HCC) 03/07/2020    No past surgical history on file.  Prior to Admission medications   Medication Sig Start Date End Date Taking? Authorizing Provider  ACETAMINOPHEN PO Take 650 mg by mouth every 6 (six) hours as needed.    [provider]  APIXABAN Everlene Balls) VTE STARTER PACK (10MG  AND 5MG ) Take as directed on package: start with two-5mg  tablets twice daily for 7 days. On day 8, switch to one-5mg  tablet twice daily. 01/31/20   , MD  aspirin 325 MG EC tablet Take 325 mg by mouth daily.    [provider]  atorvastatin (LIPITOR) 40 MG tablet Take 40 mg by mouth at bedtime.    [provider]  cephALEXin (KEFLEX) 500 MG capsule Take 500 mg by mouth 3 (three) times daily.    [provider]  divalproex (DEPAKOTE SPRINKLE) 125 MG capsule Take by mouth at bedtime. 04/26/20   [provider]  divalproex (DEPAKOTE) 125 MG DR tablet Take 125 mg by mouth at bedtime.    [provider]  divalproex (DEPAKOTE) 125 MG DR tablet Take 125 mg by mouth at bedtime.    [provider]  lisinopril (ZESTRIL) 10 MG tablet Take 20 mg by mouth daily.    [provider]  lisinopril (ZESTRIL) 20 MG tablet  06/01/20   [provider]  ondansetron (ZOFRAN ODT) 4 MG disintegrating tablet Take 1 tablet (4 mg total) by mouth every 8 (eight) hours as needed for nausea or vomiting. 01/31/20   08/01/20, MD  ondansetron (ZOFRAN) 4 MG tablet Take 4 mg by mouth every 8 (eight) hours as needed for nausea or vomiting.    [provider]  PARoxetine (PAXIL) 20 MG tablet Take 20 mg by mouth daily.    [provider]  sennosides-docusate sodium (SENOKOT-S) 8.6-50 MG tablet Take 1 tablet by mouth as directed. Every 3 days    [provider]    Allergies Patient has no known  allergies.  No family history on file.  Social History Social History   Tobacco Use  . Smoking status: Former Games developer  . Smokeless tobacco: Never Used  Substance Use Topics  . Alcohol use: Not Currently  . Drug use: Not Currently      Review of Systems Review of systems is obtained by his sister Constitutional: No fever/chills Eyes: No visual changes. ENT: No sore throat. Cardiovascular: Denies chest pain. Respiratory: Denies shortness of breath. Gastrointestinal: Abdominal pain, nausea, vomiting Genitourinary: Negative for dysuria.  Hematuria Musculoskeletal: Negative for back pain. Skin: Negative for rash. Neurological: Negative for  headaches, focal weakness or numbness. All other ROS negative ____________________________________________   PHYSICAL EXAM:  VITAL SIGNS: ED Triage Vitals  Enc Vitals Group     BP 06/06/20 2023 (!) 141/90     Pulse Rate 06/06/20 2023 (!) 50     Resp 06/06/20 2023 18     Temp 06/06/20 2023 97.7 F (36.5 C)     Temp Source 06/06/20 2023 Oral     SpO2 06/06/20 2023 97 %     Weight 06/06/20 2024 22 lb (9.979 kg)     Height 06/06/20 2024 5\' 8"  (1.727 m)     Head Circumference --      Peak Flow --      Pain Score 06/06/20 2024 0     Pain Loc --      Pain Edu? --      Excl. in GC? --     Constitutional: Alert and oriented. Well appearing and in no acute distress. Eyes: Conjunctivae are normal. EOMI. Head: Atraumatic. Nose: No congestion/rhinnorhea. Mouth/Throat: Mucous membranes are moist.   Neck: No stridor. Trachea Midline. FROM Cardiovascular: Normal rate, regular rhythm. Grossly normal heart sounds.  Good peripheral circulation. Respiratory: Normal respiratory effort.  No retractions. Lungs CTAB. Gastrointestinal: Soft slight tenderness over his lower mid abdomen. No distention. No abdominal bruits.  Musculoskeletal: No lower extremity tenderness nor edema.  No joint effusions. Neurologic: Patient can nod yes or no but otherwise aphasic.  Baseline weakness on the right no gross focal neurologic deficits are appreciated.   Skin:  Skin is warm, dry and intact. No rash noted. Psychiatric: Mood and affect are normal.  Abnormal speech GU: No testicle pain.  Penile urethra without any lacerations.  ____________________________________________   LABS (all labs ordered are listed, but only abnormal results are displayed)  Labs Reviewed  CBC WITH DIFFERENTIAL/PLATELET  COMPREHENSIVE METABOLIC PANEL  LIPASE, BLOOD  PROTIME-INR  APTT  URINALYSIS, ROUTINE W REFLEX MICROSCOPIC   ____________________________________________   RADIOLOGY  Official radiology report(s): CT  Renal Stone Study  Result Date: 06/06/2020 CLINICAL DATA:  Flank pain EXAM: CT ABDOMEN AND PELVIS WITHOUT CONTRAST TECHNIQUE: Multidetector CT imaging of the abdomen and pelvis was performed following the standard protocol without IV contrast. COMPARISON:  01/31/2020 FINDINGS: Lower chest: No acute abnormality. Hepatobiliary: No focal liver abnormality is seen. No gallstones, gallbladder wall thickening, or biliary dilatation. Pancreas: Unremarkable. No pancreatic ductal dilatation or surrounding inflammatory changes. Spleen: Normal in size without focal abnormality. Adrenals/Urinary Tract: Adrenal glands are within normal limits. Kidneys are well visualized with large mid polar cyst on the right. No obstructive changes are seen. A tiny 1-2 mm stone is noted in the right mid ureter best seen on image number 53 of series 2 and image number 91 of series 5. The more distal right ureter appears within normal limits. Left ureter is unremarkable. Bladder is well distended. Stomach/Bowel: Scattered diverticular change  of the colon is noted. The appendix is within normal limits. No obstructive or inflammatory changes of the small bowel or stomach are noted. Vascular/Lymphatic: No significant vascular findings are present. No enlarged abdominal or pelvic lymph nodes. IVC filter is noted in place and stable. Reproductive: Prostate is unremarkable. Other: No abdominal wall hernia or abnormality. No abdominopelvic ascites. Musculoskeletal: No acute or significant osseous findings. IMPRESSION: Tiny right mid ureteral stone without significant obstructive change. No other acute abnormality is noted. Electronically Signed   By: Alcide Clever M.D.   On: 06/06/2020 21:10    ____________________________________________   PROCEDURES  Procedure(s) performed (including Critical Care):  Procedures   ____________________________________________   INITIAL IMPRESSION / ASSESSMENT AND PLAN / ED COURSE  Jonathan Newman was  evaluated in Emergency Department on 06/06/2020 for the symptoms described in the history of present illness. He was evaluated in the context of the global COVID-19 pandemic, which necessitated consideration that the patient might be at risk for infection with the SARS-CoV-2 virus that causes COVID-19. Institutional protocols and algorithms that pertain to the evaluation of patients at risk for COVID-19 are in a state of rapid change based on information released by regulatory bodies including the CDC and federal and state organizations. These policies and algorithms were followed during the patient's care in the ED.    Patient is a 60 year old who comes in with hematuria and history somewhat limited due to him not really been able to tell us what is going on due to prior stroke therefore will get labs to evaluate for Electra abnormalities, AKI, coagulopathy, anemia, bladder scan to evaluate for retention, CT scan evaluate for kidney stone  Labs are reassuring.  No anemia.  Patient is not retaining postvoid is 100 cc.  CT scan does show a kidney stone on the right that is very tiny.  Patient denies any dysuria.  Urine had a lot of interference due to the coloring did have some white cells in it but could just be skin sloughing secondary to the kidney stone.  Patient denies any dysuria, no fever, no white count therefore I do not think that it looks infected.  We will send for urine culture.  Had a lengthy discussion with the POA that if he develops a fever he is to return to the ER immediately and she expressed understanding.  Given urology follow-up but I suspect that the hematuria is from the small kidney stone.  We also discussed following up with his primary care doctor for repeat hemoglobin if he cannot get seen by urology and the hematuria is continuing.  We also discussed that if he stops being able to urinate that he needs to be return to the ER to make sure he is not retaining.    I discussed the  provisional nature of ED diagnosis, the treatment so far, the ongoing plan of care, follow up appointments and return precautions with the patient and any family or support people present. They expressed understanding and agreed with the plan, discharged home.             ____________________________________________   FINAL CLINICAL IMPRESSION(S) / ED DIAGNOSES   Final diagnoses:  Hematuria, unspecified type  Kidney stone      MEDICATIONS GIVEN DURING THIS VISIT:  Medications  sodium chloride 0.9 % bolus 500 mL (0 mLs Intravenous Hold 06/06/20 2229)     ED Discharge Orders    None       Note:  This document was prepared  using Conservation officer, historic buildingsDragon voice recognition software and may include unintentional dictation errors.   Concha SeFunke, Magdelene Ruark E, MD 06/06/20 [phone]

## 2020-06-06 NOTE — Discharge Instructions (Addendum)
You have a kidney stone. Call urology number above to schedule outpatient appointment. Return to ED for fevers, unable to keep food down, weakness, unable to urinate or any other concerns.   IMPRESSION:  Tiny right mid ureteral stone without significant obstructive  change.     No other acute abnormality is noted.

## 2020-06-06 NOTE — ED Notes (Signed)
Resumed care from russell rn.  Pt alert.  Family with pt.  No distress noted

## 2020-06-06 NOTE — ED Notes (Signed)
Bladder scan - 118

## 2020-06-06 NOTE — ED Triage Notes (Signed)
Pt to triage via wheelchair.  Sister reports pt has hematuria.  Pt had emesis today.   Decreased appetite.  Hx cva, aphasia.  Pt alert.  Sx began today/

## 2020-06-08 LAB — URINE CULTURE: Culture: <10000 — AB

## 2020-06-12 ENCOUNTER — Encounter: Payer: Self-pay | Admitting: Urology

## 2020-06-12 ENCOUNTER — Ambulatory Visit (INDEPENDENT_AMBULATORY_CARE_PROVIDER_SITE_OTHER): Payer: Medicaid Other | Admitting: Urology

## 2020-06-12 ENCOUNTER — Other Ambulatory Visit: Payer: Self-pay

## 2020-06-12 VITALS — BP 165/93 | HR 46 | Ht 68.0 in | Wt 222.0 lb

## 2020-06-12 DIAGNOSIS — R310 Gross hematuria: Secondary | ICD-10-CM | POA: Diagnosis not present

## 2020-06-12 DIAGNOSIS — N200 Calculus of kidney: Secondary | ICD-10-CM

## 2020-06-12 DIAGNOSIS — N400 Enlarged prostate without lower urinary tract symptoms: Secondary | ICD-10-CM | POA: Diagnosis not present

## 2020-06-12 MED ORDER — TAMSULOSIN HCL 0.4 MG PO CAPS: 0.4000 mg | ORAL_CAPSULE | Freq: Every day | ORAL | 11 refills | Status: DC

## 2020-06-12 NOTE — Progress Notes (Signed)
   06/12/20 3:35 PM   Corrinne Eagle [birth year] [id number]  CC: gross hematuria, kidney stone, BPH  HPI: I saw Mr. Daft and his sister in urology clinic today for evaluation of gross hematuria.  He is a 60 year old male with a history of stroke and residual aphasia who presented to the ED on 06/06/2020 with significant gross hematuria and some abdominal pain with nausea.  CT at that time showed a small 1 to 2 mm right mid ureteral stone, no other renal stones.  Urinalysis showed microscopic hematuria, but culture was negative.  He is anticoagulated with his history of stroke.  He reports after a few days his pain and gross hematuria completely resolved.  He has had no problems over the last week in terms of pain or any blood in the urine.  He denies a prior history of kidney stones.  After his stroke, he had some incontinence that sounds like that has transition to more urgency frequency over the last few months as well as some difficulty urinating and weak stream.  The bladder was nondistended on CT.  He has never tried any medications for this previously.  He drinks primarily water during the day.  Majority of the history is obtained from his sister, as the patient is aphasic.  Urinalysis today is completely benign with 0-5 WBCs, 0-2 RBCs, no epithelial cells, no bacteria, nitrite negative, no leukocytes.   PMH: Past Medical History:  Diagnosis Date  . Constipation   . CVA (cerebral vascular accident) (HCC)   . Diarrhea   . Dysarthria and anarthria   . Hemiplegia and hemiparesis following cerebral infarction affecting right dominant side (HCC)   . Hyperlipidemia   . Pain in right leg     Family History: No family history on file.  Social History:  reports that he has quit smoking. He has never used smokeless tobacco. He reports previous alcohol use. He reports previous drug use.  Physical Exam: BP (!) 165/93   Pulse (!) 46   Ht 5\' 8"  (1.727 m)   Wt 222 lb (100.7 kg)   BMI  33.75 kg/m    Constitutional: Aphasic, nods yes or no to questions. Cardiovascular: No clubbing, cyanosis, or edema. Respiratory: Normal respiratory effort, no increased work of breathing. GI: Abdomen is soft, nontender, nondistended, no abdominal masses .  Laboratory Data: Reviewed, see HPI  Pertinent Imaging: I have personally viewed and interpreted the CT dated 06/06/2020 that shows a 1 mm right mid ureteral stone.  Assessment & Plan:   60 year old male with history of stroke remains on anticoagulation who was seen in the ED 1 week ago for significant gross hematuria.  This resolved spontaneously and he has been doing well over the last few days with no hematuria or pain.  I suspect this was secondary to his mid ureteral stone, and this has subsequently passed.  Regarding his urinary symptoms we discussed the differences between BPH and OAB, and the effect of CVA on the bladder and urinary symptoms.  I think it is reasonable to start with a trial of Flomax to see if this improves some of his urinary straining and weak stream, but could consider Myrbetriq in the future.  Trial of Flomax for urinary symptoms RTC 3 months IPSS and PVR  46, MD 06/12/2020  [facility] [address], [address] [place], [place] [phone]

## 2020-06-12 NOTE — Patient Instructions (Signed)

## 2020-06-13 LAB — URINALYSIS, COMPLETE
Bilirubin, UA: NEGATIVE
Glucose, UA: NEGATIVE
Ketones, UA: NEGATIVE
Leukocytes,UA: NEGATIVE
Nitrite, UA: NEGATIVE
Protein,UA: NEGATIVE
RBC, UA: NEGATIVE
Specific Gravity, UA: 1.025 (ref 1.005–1.030)
Urobilinogen, Ur: 1 mg/dL (ref 0.2–1.0)
pH, UA: 6.5 (ref 5.0–7.5)

## 2020-06-13 LAB — MICROSCOPIC EXAMINATION: Bacteria, UA: NONE SEEN

## 2020-06-27 ENCOUNTER — Ambulatory Visit (INDEPENDENT_AMBULATORY_CARE_PROVIDER_SITE_OTHER): Payer: Medicaid Other | Admitting: Vascular Surgery

## 2020-09-05 ENCOUNTER — Other Ambulatory Visit: Payer: Self-pay

## 2020-09-05 ENCOUNTER — Ambulatory Visit (INDEPENDENT_AMBULATORY_CARE_PROVIDER_SITE_OTHER): Payer: Medicaid Other | Admitting: Vascular Surgery

## 2020-09-05 ENCOUNTER — Encounter (INDEPENDENT_AMBULATORY_CARE_PROVIDER_SITE_OTHER): Payer: Self-pay | Admitting: Vascular Surgery

## 2020-09-05 VITALS — BP 171/112 | HR 50 | Resp 19 | Ht 70.0 in

## 2020-09-05 DIAGNOSIS — I87009 Postthrombotic syndrome without complications of unspecified extremity: Secondary | ICD-10-CM

## 2020-09-05 DIAGNOSIS — I82411 Acute embolism and thrombosis of right femoral vein: Secondary | ICD-10-CM | POA: Diagnosis not present

## 2020-09-05 DIAGNOSIS — I639 Cerebral infarction, unspecified: Secondary | ICD-10-CM | POA: Diagnosis not present

## 2020-09-05 NOTE — Assessment & Plan Note (Signed)
Patient has now been on anticoagulation for 6 months but with his history of extensive DVT, persistent leg swelling, and devastating stroke with essentially no movement in his legs, he will remain on full anticoagulation for least another 6 months.  He will need some degree of anticoagulation indefinitely.

## 2020-09-05 NOTE — Progress Notes (Signed)
MRN : [id number]  Jonathan Newman is a 60 y.o. (1960-09-13) male who presents with chief complaint of  Chief Complaint  Patient presents with   Follow-up  .  History of Present Illness: Patient returns today in follow up of his right leg swelling and previous DVT.  He is using the lymphedema pump an hour daily.  He is using the compression wraps with a Velcro compression system.  This has improved the swelling although the swelling is still present.  The skin has remained intact and he has had no further skin breakdown.  No fevers or chills.  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     aspirin 325 MG EC tablet Take 325 mg by mouth daily.     atorvastatin (LIPITOR) 40 MG tablet Take 40 mg by mouth at bedtime.     divalproex (DEPAKOTE) 125 MG DR tablet Take 125 mg by mouth at bedtime.     ELIQUIS 5 MG TABS tablet Take 5 mg by mouth 2 (two) times daily.     lisinopril (ZESTRIL) 20 MG tablet      loratadine (CLARITIN) 10 MG tablet Take 10 mg by mouth daily.     ondansetron (ZOFRAN ODT) 4 MG disintegrating tablet Take 1 tablet (4 mg total) by mouth every 8 (eight) hours as needed for nausea or vomiting. 20 tablet 0   PARoxetine (PAXIL) 10 MG tablet Take 10 mg by mouth daily.     sennosides-docusate sodium (SENOKOT-S) 8.6-50 MG tablet Take 1 tablet by mouth as directed. Every 3 days     tamsulosin (FLOMAX) 0.4 MG CAPS capsule Take 1 capsule (0.4 mg total) by mouth daily. 30 capsule 11   zolpidem (AMBIEN) 10 MG tablet Take 10 mg by mouth at bedtime as needed.     No current facility-administered medications for this visit.    Past Medical History:  Diagnosis Date   Constipation    CVA (cerebral vascular accident) (HCC)    Diarrhea    Dysarthria and anarthria    Hemiplegia and hemiparesis following cerebral infarction affecting right  dominant side (HCC)    Hyperlipidemia    Pain in right leg     No past surgical history on file.   Social History   Tobacco Use   Smoking status: Former   Smokeless tobacco: Never  Substance Use Topics   Alcohol use: Not Currently   Drug use: Not Currently   Family History Sister healthy Mother died 05-24-18  No Known Allergies  REVIEW OF SYSTEMS (Negative unless checked)   Constitutional: [] Weight loss  [] Fever  [] Chills Cardiac: [] Chest pain   [] Chest pressure   [] Palpitations   [] Shortness of breath when laying flat   [] Shortness of breath at rest   [] Shortness of breath with exertion. Vascular:  [] Pain in legs with walking   [] Pain in legs at rest   [] Pain in legs when laying flat   [] Claudication   [] Pain in feet when walking  [] Pain in feet at rest  [] Pain in feet when laying flat   [x] History of DVT   [x] Phlebitis   [x] Swelling in legs   [] Varicose veins   [x] Non-healing ulcers Pulmonary:   [] Uses home oxygen   [] Productive cough   [] Hemoptysis   [] Wheeze  [] COPD   [] Asthma Neurologic:  [] Dizziness  [] Blackouts   []   Seizures   [x] History of stroke   [] History of TIA  [] Aphasia   [] Temporary blindness   [] Dysphagia   [x] Weakness or numbness in arms   [x] Weakness or numbness in legs Musculoskeletal:  [] Arthritis   [] Joint swelling   [] Joint pain   [] Low back pain Hematologic:  [] Easy bruising  [] Easy bleeding   [] Hypercoagulable state   [] Anemic  [] Hepatitis Gastrointestinal:  [] Blood in stool   [] Vomiting blood  [] Gastroesophageal reflux/heartburn   [] Abdominal pain Genitourinary:  [] Chronic kidney disease   [] Difficult urination  [] Frequent urination  [] Burning with urination   [] Hematuria Skin:  [] Rashes   [x] Ulcers   [x] Wounds Psychological:  [] History of anxiety   []  History of major depression.  Physical Examination  BP (!) 171/112 (BP Location: Left Arm)   Pulse (!) 50   Resp 19   Ht 5\' 10"  (1.778 m)   BMI 31.85 kg/m  Gen:  WD/WN, NAD Head: [place]/AT, No temporalis  wasting. Ear/Nose/Throat: Hearing grossly intact, nares w/o erythema or drainage Eyes: Conjunctiva clear. Sclera non-icteric Neck: Supple.  Trachea midline Pulmonary:  Good air movement, no use of accessory muscles.  Cardiac: RRR, no JVD Vascular:  Vessel Right Left  Radial Palpable Palpable               Musculoskeletal: Right hemiparesis.  No deformity or atrophy. 1-2+ RLE edema. Neurologic: Aphasia. Right hemiparetic. Dermatologic: No rashes or ulcers noted.  No cellulitis or open wounds.      Labs Recent Results (from the past 2160 hour(s))  Urinalysis, Complete     Status: None   Collection Time: 06/12/20  2:00 PM  Result Value Ref Range   Specific Gravity, UA 1.025 1.005 - 1.030   pH, UA 6.5 5.0 - 7.5   Color, UA Yellow Yellow   Appearance Ur Clear Clear   Leukocytes,UA Negative Negative   Protein,UA Negative Negative/Trace   Glucose, UA Negative Negative   Ketones, UA Negative Negative   RBC, UA Negative Negative   Bilirubin, UA Negative Negative   Urobilinogen, Ur 1.0 0.2 - 1.0 mg/dL   Nitrite, UA Negative Negative   Microscopic Examination See below:   Microscopic Examination     Status: None   Collection Time: 06/12/20  2:00 PM   Urine  Result Value Ref Range   WBC, UA 0-5 0 - 5 /hpf   RBC 0-2 0 - 2 /hpf   Epithelial Cells (non renal) 0-10 0 - 10 /hpf   Bacteria, UA None seen None seen/Few    Radiology No results found.  Assessment/Plan Stroke [facility]) The patient had a fairly devastating stroke leaving him right hemiparetic.  I suspect that his DVT was actually shortly after the stroke and this is on his affected leg which she cannot move.  He would be at high risk of recurrent DVT if not on anticoagulation due to his immobility.  I would agreed with continuous Eliquis therapy.  DVT (deep venous thrombosis) (HCC) Patient has now been on anticoagulation for 6 months but with his history of extensive DVT, persistent leg swelling, and devastating stroke  with essentially no movement in his legs, he will remain on full anticoagulation for least another 6 months.  He will need some degree of anticoagulation indefinitely.  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  I suspect the swelling will be long-term.  The compression wraps and lymphedema  pump have improved the swelling and I would continue these.  Elevate as tolerated.    Festus Barren, MD  09/05/2020 3:10 PM    This note was created with Dragon medical transcription system.  Any errors from dictation are purely unintentional

## 2020-09-05 NOTE — Assessment & Plan Note (Signed)
Patient has significant postphlebitic symptoms in the right leg but this is also the leg in which she had a stroke and he has no motion.  I suspect the swelling will be long-term.  The compression wraps and lymphedema pump have improved the swelling and I would continue these.  Elevate as tolerated.

## 2020-09-11 ENCOUNTER — Telehealth (INDEPENDENT_AMBULATORY_CARE_PROVIDER_SITE_OTHER): Payer: Self-pay | Admitting: Vascular Surgery

## 2020-09-11 NOTE — Telephone Encounter (Signed)
Patient sister called in stating patient has developed a sore either on Thursday or Friday or his right leg. She states his leg is slightly swollen and tight to the touch.  Patient was last seen on 09/05/2020 w/ Dr. Wyn Quaker and has a follow up appointment until  next year 2023. Please advise.

## 2020-09-11 NOTE — Telephone Encounter (Signed)
Patient can come in for unna wraps

## 2020-09-12 ENCOUNTER — Encounter (INDEPENDENT_AMBULATORY_CARE_PROVIDER_SITE_OTHER): Payer: Medicaid Other

## 2020-09-12 ENCOUNTER — Ambulatory Visit (INDEPENDENT_AMBULATORY_CARE_PROVIDER_SITE_OTHER): Payer: Medicaid Other | Admitting: Vascular Surgery

## 2020-09-12 ENCOUNTER — Other Ambulatory Visit: Payer: Self-pay

## 2020-09-12 DIAGNOSIS — I87009 Postthrombotic syndrome without complications of unspecified extremity: Secondary | ICD-10-CM

## 2020-09-12 DIAGNOSIS — I82411 Acute embolism and thrombosis of right femoral vein: Secondary | ICD-10-CM

## 2020-09-12 DIAGNOSIS — I639 Cerebral infarction, unspecified: Secondary | ICD-10-CM | POA: Diagnosis not present

## 2020-09-12 DIAGNOSIS — L97211 Non-pressure chronic ulcer of right calf limited to breakdown of skin: Secondary | ICD-10-CM

## 2020-09-12 MED ORDER — CEPHALEXIN 500 MG PO CAPS: 500.0000 mg | ORAL_CAPSULE | Freq: Three times a day (TID) | ORAL | 1 refills | Status: DC

## 2020-09-12 NOTE — Assessment & Plan Note (Signed)
New ulceration on the right lateral ankle with surrounding cellulitis.  Prescription for Keflex sent into the pharmacy.  A 3 layer Unna boot was placed today and will be changed weekly.  Recheck in about a month.

## 2020-09-12 NOTE — Progress Notes (Signed)
MRN : [id number]  Jonathan Newman is a 60 y.o. (10-06-1960) male who presents with chief complaint of No chief complaint on file. [name]  History of Present Illness: Patient returns today in follow up with worsening pain and swelling of the right lower leg.  This is associated with marked redness and a small ulceration on the right lateral ankle.  This is very tender to the touch.  No obvious fever.  His sister provides the history as he has aphasia.  Current Outpatient Medications  Medication Sig Dispense Refill   ACETAMINOPHEN PO Take 650 mg by mouth every 6 (six) hours as needed.     aspirin 325 MG EC tablet Take 325 mg by mouth daily.     atorvastatin (LIPITOR) 40 MG tablet Take 40 mg by mouth at bedtime.     divalproex (DEPAKOTE) 125 MG DR tablet Take 125 mg by mouth at bedtime.     ELIQUIS 5 MG TABS tablet Take 5 mg by mouth 2 (two) times daily.     lisinopril (ZESTRIL) 20 MG tablet      loratadine (CLARITIN) 10 MG tablet Take 10 mg by mouth daily.     ondansetron (ZOFRAN ODT) 4 MG disintegrating tablet Take 1 tablet (4 mg total) by mouth every 8 (eight) hours as needed for nausea or vomiting. 20 tablet 0   PARoxetine (PAXIL) 10 MG tablet Take 10 mg by mouth daily.     sennosides-docusate sodium (SENOKOT-S) 8.6-50 MG tablet Take 1 tablet by mouth as directed. Every 3 days     tamsulosin (FLOMAX) 0.4 MG CAPS capsule Take 1 capsule (0.4 mg total) by mouth daily. 30 capsule 11   zolpidem (AMBIEN) 10 MG tablet Take 10 mg by mouth at bedtime as needed.     No current facility-administered medications for this visit.    Past Medical History:  Diagnosis Date   Constipation    CVA (cerebral vascular accident) (HCC)    Diarrhea    Dysarthria and anarthria    Hemiplegia and hemiparesis following cerebral infarction affecting right dominant side (HCC)    Hyperlipidemia    Pain in right leg     No past surgical history on file.  Social History        Tobacco Use   Smoking status:  Former   Smokeless tobacco: Never  Substance Use Topics   Alcohol use: Not Currently   Drug use: Not Currently    Family History Sister healthy Mother died 2018-05-22   No Known Allergies   REVIEW OF SYSTEMS (Negative unless checked)   Constitutional: [] Weight loss  [] Fever  [] Chills Cardiac: [] Chest pain   [] Chest pressure   [] Palpitations   [] Shortness of breath when laying flat   [] Shortness of breath at rest   [] Shortness of breath with exertion. Vascular:  [] Pain in legs with walking   [] Pain in legs at rest   [] Pain in legs when laying flat   [] Claudication   [] Pain in feet when walking  [] Pain in feet at rest  [] Pain in feet when laying flat   [x] History of DVT   [x] Phlebitis   [x] Swelling in legs   [] Varicose veins   [x] Non-healing ulcers Pulmonary:   [] Uses home oxygen   [] Productive cough   [] Hemoptysis   [] Wheeze  [] COPD   [] Asthma Neurologic:  [] Dizziness  [] Blackouts   [] Seizures   [x] History of stroke   [] History of TIA  [] Aphasia   [] Temporary blindness   [] Dysphagia   [x] Weakness or  numbness in arms   [x] Weakness or numbness in legs Musculoskeletal:  [] Arthritis   [] Joint swelling   [] Joint pain   [] Low back pain Hematologic:  [] Easy bruising  [] Easy bleeding   [] Hypercoagulable state   [] Anemic  [] Hepatitis Gastrointestinal:  [] Blood in stool   [] Vomiting blood  [] Gastroesophageal reflux/heartburn   [] Abdominal pain Genitourinary:  [] Chronic kidney disease   [] Difficult urination  [] Frequent urination  [] Burning with urination   [] Hematuria Skin:  [] Rashes   [x] Ulcers   [x] Wounds Psychological:  [] History of anxiety   []  History of major depression.    Physical Examination  There were no vitals taken for this visit. Gen:  WD/WN, NAD Head: [place]/AT, No temporalis wasting. Ear/Nose/Throat: Hearing grossly intact, nares w/o erythema or drainage Eyes: Conjunctiva clear. Sclera non-icteric Neck: Supple.  Trachea midline Pulmonary:  Good air movement, no use of accessory muscles.   Cardiac: RRR, no JVD Vascular:  Vessel Right Left  Radial Palpable Palpable           Musculoskeletal: Right hemiparesis.  Right leg is erythematous with a small ulcer on the right lateral ankle.  2-3+ right lower extremity edema. Neurologic: Right-sided weakness.  Aphasic. Psychiatric: Judgment intact, Mood & affect appropriate for pt's clinical situation. Dermatologic: Small ulceration on the right lateral ankle with surrounding erythema as described above      Labs No results found for this or any previous visit (from the past 2160 hour(s)).  Radiology No results found.  Assessment/Plan Stroke [facility]) The patient had a fairly devastating stroke leaving him right hemiparetic.  I suspect that his DVT was actually shortly after the stroke and this is on his affected leg which she cannot move.  He would be at high risk of recurrent DVT if not on anticoagulation due to his immobility.  I would agreed with continuous Eliquis therapy.   DVT (deep venous thrombosis) (HCC) Patient has now been on anticoagulation for 6 months but with his history of extensive DVT, persistent leg swelling, and devastating stroke with essentially no movement in his legs, he will remain on full anticoagulation for least another 6 months.  He will need some degree of anticoagulation indefinitely.   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  I suspect the swelling will be long-term.  The compression wraps and lymphedema pump have improved the swelling and I would continue these.  Elevate as tolerated.  Lower limb ulcer, calf, right, limited to breakdown of skin (HCC) New ulceration on the right lateral ankle with surrounding cellulitis.  Prescription for Keflex sent into the pharmacy.  A 3 layer Unna boot was placed today and will be changed weekly.  Recheck in about a month.    ,  MD  09/12/2020 2:36 PM    This note was created with Dragon medical transcription system.  Any errors from dictation are purely unintentional

## 2020-09-13 ENCOUNTER — Ambulatory Visit: Payer: Medicaid Other | Admitting: Urology

## 2020-09-20 ENCOUNTER — Ambulatory Visit: Payer: Medicaid Other | Attending: Family Medicine | Admitting: Physical Therapy

## 2020-09-20 ENCOUNTER — Ambulatory Visit: Payer: Medicaid Other | Admitting: Speech Pathology

## 2020-09-20 ENCOUNTER — Ambulatory Visit (INDEPENDENT_AMBULATORY_CARE_PROVIDER_SITE_OTHER): Payer: Medicaid Other | Admitting: Nurse Practitioner

## 2020-09-20 ENCOUNTER — Other Ambulatory Visit: Payer: Self-pay

## 2020-09-20 VITALS — BP 127/78 | HR 46 | Ht 68.0 in | Wt 222.0 lb

## 2020-09-20 DIAGNOSIS — L97211 Non-pressure chronic ulcer of right calf limited to breakdown of skin: Secondary | ICD-10-CM | POA: Diagnosis not present

## 2020-09-20 NOTE — Progress Notes (Signed)
History of Present Illness  There is no documented history at this time  Assessments & Plan   There are no diagnoses linked to this encounter.    Additional instructions  Subjective:  Patient presents with venous ulcer of the Right lower extremity.    Procedure:  3 layer unna wrap was placed Right lower extremity.   Plan:   Follow up in one week.   

## 2020-09-21 ENCOUNTER — Other Ambulatory Visit: Payer: Self-pay | Admitting: Neurology

## 2020-09-21 DIAGNOSIS — R2990 Unspecified symptoms and signs involving the nervous system: Secondary | ICD-10-CM

## 2020-09-27 ENCOUNTER — Ambulatory Visit (INDEPENDENT_AMBULATORY_CARE_PROVIDER_SITE_OTHER): Payer: Medicaid Other | Admitting: Urology

## 2020-09-27 ENCOUNTER — Other Ambulatory Visit: Payer: Self-pay

## 2020-09-27 ENCOUNTER — Ambulatory Visit (INDEPENDENT_AMBULATORY_CARE_PROVIDER_SITE_OTHER): Payer: Medicaid Other | Admitting: Nurse Practitioner

## 2020-09-27 ENCOUNTER — Encounter: Payer: Self-pay | Admitting: Urology

## 2020-09-27 VITALS — BP 139/77 | HR 47

## 2020-09-27 DIAGNOSIS — L97211 Non-pressure chronic ulcer of right calf limited to breakdown of skin: Secondary | ICD-10-CM | POA: Diagnosis not present

## 2020-09-27 DIAGNOSIS — N3281 Overactive bladder: Secondary | ICD-10-CM | POA: Diagnosis not present

## 2020-09-27 DIAGNOSIS — N400 Enlarged prostate without lower urinary tract symptoms: Secondary | ICD-10-CM

## 2020-09-27 MED ORDER — MIRABEGRON ER 50 MG PO TB24: 50.0000 mg | ORAL_TABLET | Freq: Every day | ORAL | 0 refills | Status: DC

## 2020-09-27 NOTE — Progress Notes (Signed)
   09/27/2020 3:28 PM   Corrinne Eagle Dec 23, 1960 [id number]  Reason for visit: Follow up urinary symptoms, recent kidney stone  HPI: 60 year old male with history of stroke who here again today with his sister for the above issues.  The history is obtained primarily from the sister as the patient is aphasic.  He recently spontaneously passed a kidney stone with resolution of pain and gross hematuria, and normal urinalysis at our last visit.  He was having urinary symptoms of urgency, frequency, and weak stream, and opted for a trial of Flomax.  He did not note any significant improvement in the urinary symptoms since that time, and continues to report bothersome urgency and frequency during the day.  He is minimally bothered at night and urinates typically 2x overnight.  He denies any further gross hematuria.  We discussed the effect of stroke on the bladder, and that this is typically overactive bladder secondary to the effects of his stroke.  I recommended a trial of Myrbetriq 50 mg daily, and risks and benefits discussed.  He would not be a good candidate for anticholinergics with his frailty and high fall risk, but could consider trospium XL 60 mg daily if no improvement with Myrbetriq.  Trial of Myrbetriq 50 mg daily, samples given RTC 1 month symptom check and PVR   Sondra Come, MD  [facility] [address] [place], [place] [address] [phone]

## 2020-09-27 NOTE — Patient Instructions (Signed)

## 2020-09-27 NOTE — Progress Notes (Signed)
History of Present Illness  There is no documented history at this time  Assessments & Plan   There are no diagnoses linked to this encounter.    Additional instructions  Subjective:  Patient presents with venous ulcer of the Right lower extremity.    Procedure:  3 layer unna wrap was placed Right lower extremity.   Plan:   Follow up in one week.   

## 2020-09-29 ENCOUNTER — Ambulatory Visit
Admission: RE | Admit: 2020-09-29 | Discharge: 2020-09-29 | Disposition: A | Discharge status: Home / Self Care | Payer: Medicaid Other | Source: Ambulatory Visit | Attending: Neurology | Admitting: Neurology

## 2020-09-29 ENCOUNTER — Other Ambulatory Visit: Payer: Self-pay

## 2020-09-29 DIAGNOSIS — R2990 Unspecified symptoms and signs involving the nervous system: Secondary | ICD-10-CM | POA: Diagnosis present

## 2020-09-29 IMAGING — MR MR HEAD W/O CM
10 series · 48 of 48 positions shown · non-contrast
Comparison: No pertinent prior exams available for comparison.

CLINICAL DATA: Stroke-like symptoms. Additional history provided:
History of CVA with right-sided paralysis, expressive aphasia. Per
patient's sister CVA in [SU]. Evaluate new stroke-like symptoms per
ordering physician.

EXAM:
MRI HEAD WITHOUT CONTRAST
TECHNIQUE: Multiplanar, multiecho pulse sequences of the brain and surrounding
structures were obtained without intravenous contrast.

[Series 2: DWI · axial · 3.0mm · 1.20mm/px · z∈[-42,+119]mm · 8 of 110 slices shown (1 of 4)]
[im 1/110]
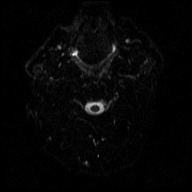
[im 16/110]
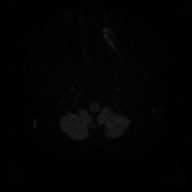
[im 32/110]
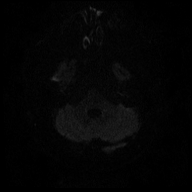
[im 47/110]
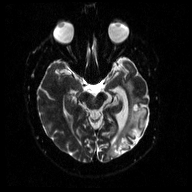
[im 63/110]
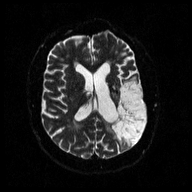
[im 78/110]
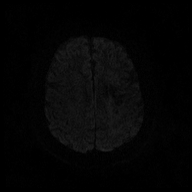
[im 94/110]
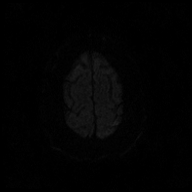
[im 110/110]
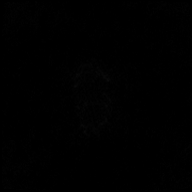

[Series 3: DWI · axial · 3.0mm · 1.20mm/px · z∈[-42,+119]mm · 4 of 55 slices shown (2 of 4)]
[im 1/55]
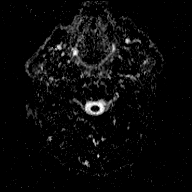
[im 19/55]
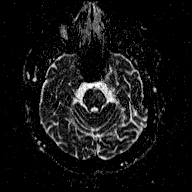
[im 37/55]
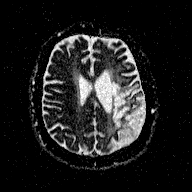
[im 55/55]
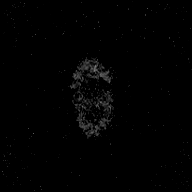

[Series 4: DWI · coronal · 3.0mm · 1.15mm/px · 7 of 90 slices shown (3 of 4)]
[im 1/90]
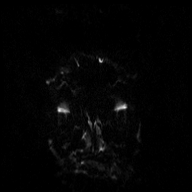
[im 15/90]
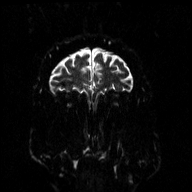
[im 30/90]
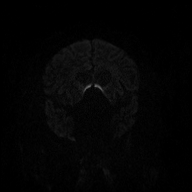
[im 45/90]
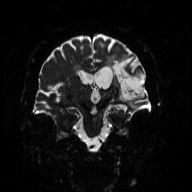
[im 60/90]
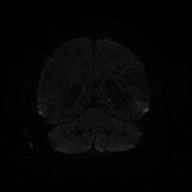
[im 75/90]
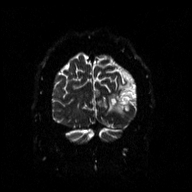
[im 90/90]
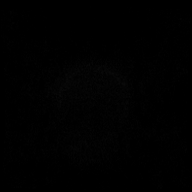

[Series 5: DWI · coronal · 3.0mm · 1.15mm/px · 4 of 45 slices shown (4 of 4)]
[im 1/45]
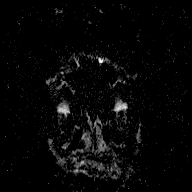
[im 15/45]
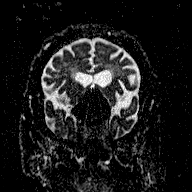
[im 30/45]
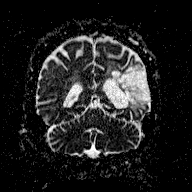
[im 45/45]
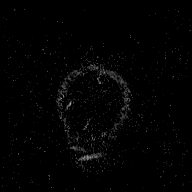

[Series 6: T1 · sagittal · 5.0mm · 0.45mm/px · 2 of 23 slices shown (1 of 2)]
[im 1/23]
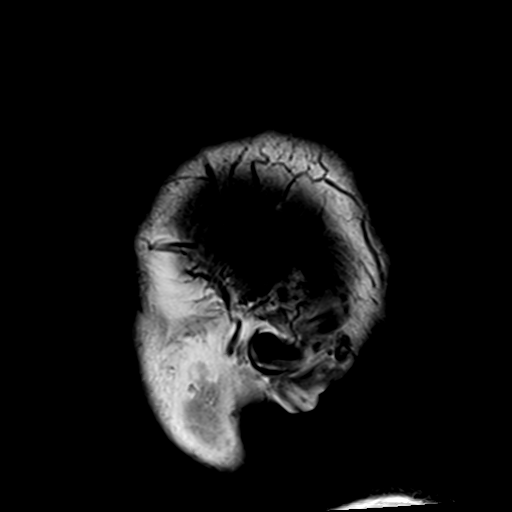
[im 23/23]
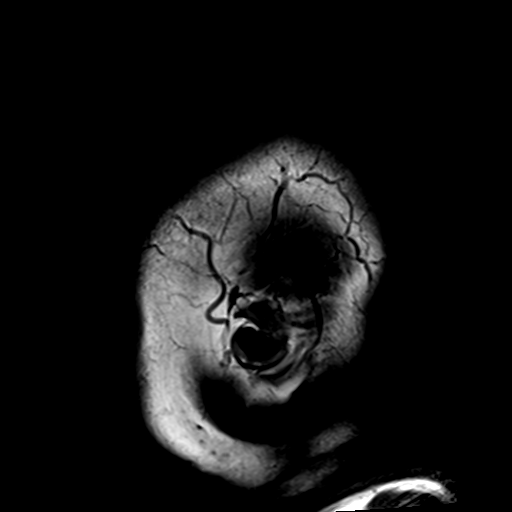

[Series 7: T2 · axial · 5.0mm · 0.72mm/px · z∈[-38,+115]mm · 2 of 23 slices shown (1 of 3)]
[im 1/23]
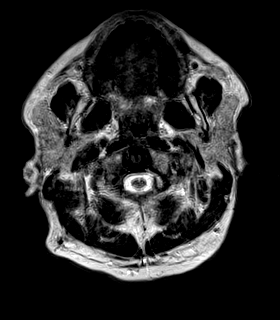
[im 23/23]
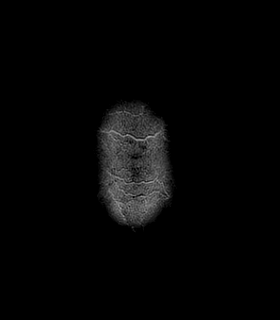

[Series 8: FLAIR · axial · 3.0mm · 0.45mm/px · z∈[-42,+119]mm · 4 of 55 slices shown]
[im 1/55]
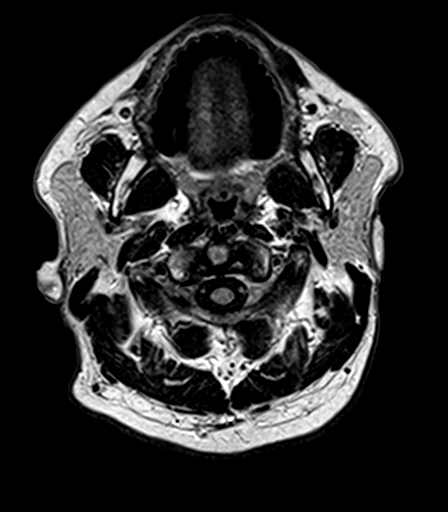
[im 19/55]
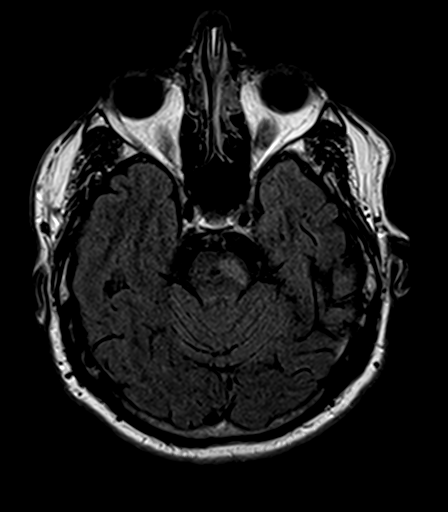
[im 37/55]
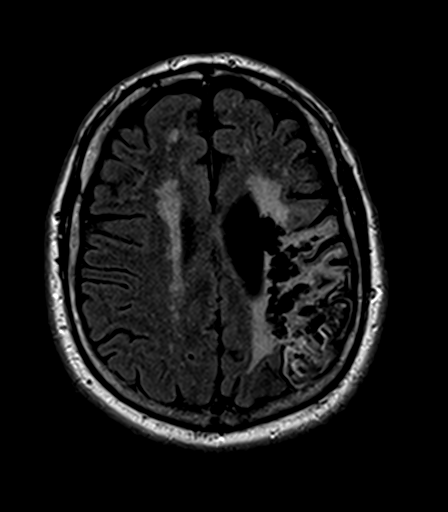
[im 55/55]
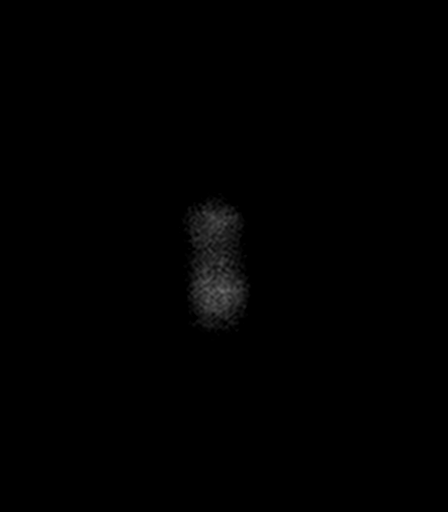

[Series 9: T2 · axial · 5.0mm · 0.72mm/px · z∈[-38,+115]mm · 2 of 23 slices shown (2 of 3)]
[im 1/23]
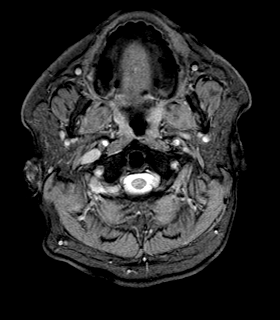
[im 23/23]
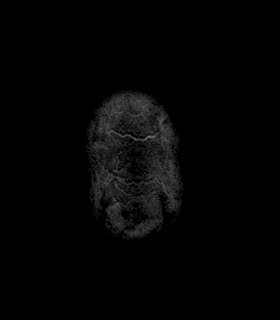

[Series 10: T1 · axial · 1.0mm · 0.94mm/px · z∈[-41,+117]mm · 13 of 160 slices shown (2 of 2)]
[im 1/160]
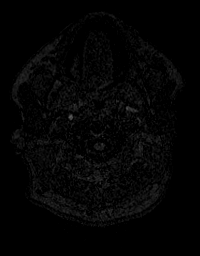
[im 14/160]
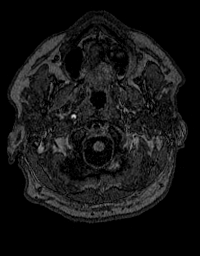
[im 27/160]
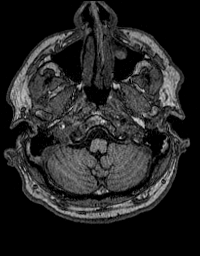
[im 40/160]
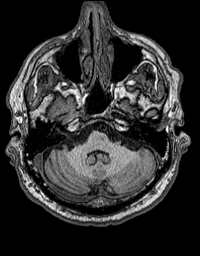
[im 54/160]
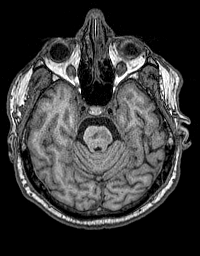
[im 67/160]
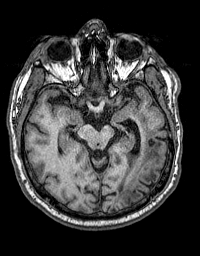
[im 80/160]
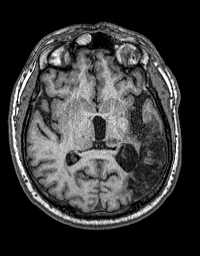
[im 93/160]
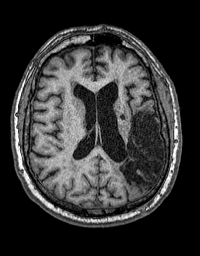
[im 107/160]
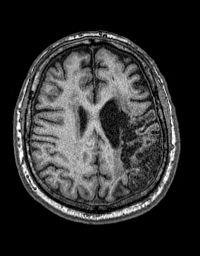
[im 120/160]
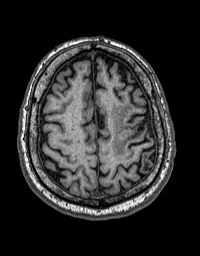
[im 133/160]
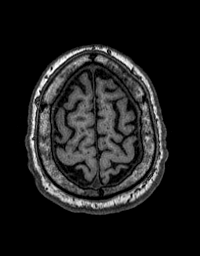
[im 146/160]
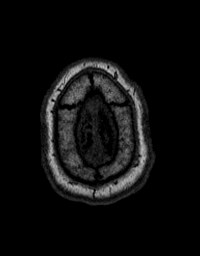
[im 160/160]
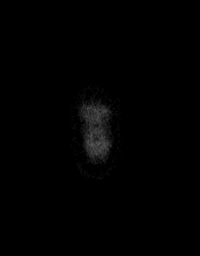

[Series 11: T2 · coronal · 5.0mm · 0.43mm/px · 2 of 29 slices shown (3 of 3)]
[im 1/29]
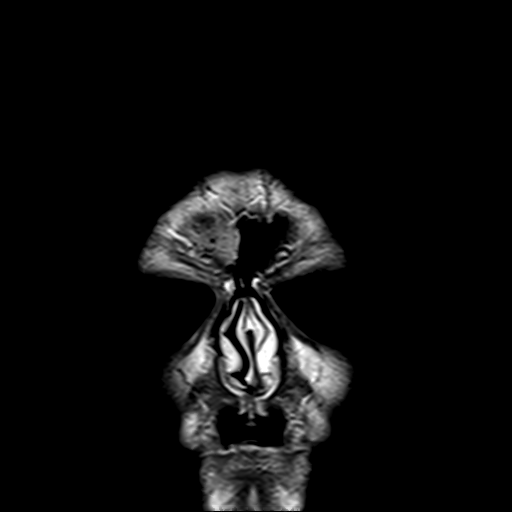
[im 29/29]
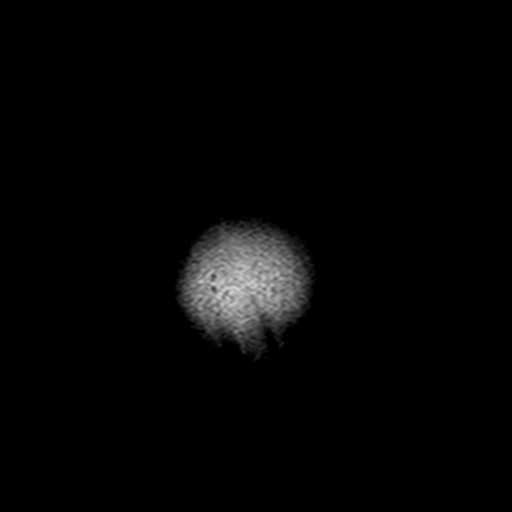

[48 of 48 positions shown; findings below may reference images not displayed]

FINDINGS: Brain:

Mildly motion degraded exam.

Mild generalized cerebral and cerebellar atrophy.

Large chronic cortical/subcortical left MCA territory and left
MCA/PCA watershed territory infarct within the left frontal,
parietal, temporal and occipital lobes, as well as left insula and
deep white matter tracts on the left. Associated cortical laminar
necrosis and/or chronic blood products within the infarct territory.
Superimposed chronic perforator infarct within the left centrum
semiovale/corona radiata and basal ganglia. Mild chronic hemosiderin
deposition at this site. Associated wallerian degeneration extending
inferiorly to the level of the left medulla.

Separate small chronic cortical infarct within the anterolateral
left frontal lobe (MCA vascular territory).

Background moderate multifocal T2/FLAIR hyperintensity within the
cerebral white matter, nonspecific but compatible with chronic small
vessel ischemic disease.

Chronic infarct within the left thalamus.

Chronic lacunar infarcts within the bilateral pons with background
moderate chronic small vessel ischemic disease.

There is no acute infarct.

No evidence of an intracranial mass.

No extra-axial fluid collection.

No midline shift.

Vascular: Signal abnormality within the M1 left middle cerebral
artery suggesting age-indeterminate high-grade stenosis or occlusion
of this vessel.

Skull and upper cervical spine: No focal suspicious marrow lesion.

Sinuses/Orbits: Visualized orbits show no acute finding. 2 cm left
maxillary sinus mucous retention cyst. Trace mucosal thickening
within the bilateral ethmoid air cells.

Impression 9 will be called to the ordering clinician or
representative by the Radiologist Assistant, and communication
documented in the PACS or [REDACTED].
IMPRESSION: Mildly motion degraded exam.

No evidence of acute or recent subacute infarction.

Large chronic left MCA territory and left MCA/PCA watershed
territory cortical/subcortical infarct, as described. Superimposed
chronic perforator infarct within the left centrum semiovale/corona
radiata and left basal ganglia. Associated wallerian degeneration on
the left.

Separate small chronic cortical infarct within the anterolateral
left frontal lobe.

Background moderate chronic small vessel ischemic changes within the
cerebral white matter.

Chronic infarct within the left thalamus.

Chronic lacunar infarcts within the pons bilaterally, with
background moderate pontine chronic small-vessel ischemic changes.

Mild generalized cerebral and cerebellar atrophy.

Signal abnormality within the M1 left middle cerebral artery
suggesting age-indeterminate high-grade stenosis or occlusion of
this vessel.

Paranasal sinus disease, as described.

## 2020-09-30 ENCOUNTER — Encounter (INDEPENDENT_AMBULATORY_CARE_PROVIDER_SITE_OTHER): Payer: Self-pay | Admitting: Nurse Practitioner

## 2020-10-01 ENCOUNTER — Encounter (INDEPENDENT_AMBULATORY_CARE_PROVIDER_SITE_OTHER): Payer: Self-pay | Admitting: Nurse Practitioner

## 2020-10-02 ENCOUNTER — Other Ambulatory Visit: Payer: Self-pay

## 2020-10-02 ENCOUNTER — Encounter: Payer: Self-pay | Admitting: Emergency Medicine

## 2020-10-02 ENCOUNTER — Encounter: Payer: Medicaid Other | Admitting: Speech Pathology

## 2020-10-02 ENCOUNTER — Emergency Department: Payer: Medicaid Other

## 2020-10-02 ENCOUNTER — Emergency Department
Admission: EM | Admit: 2020-10-02 | Discharge: 2020-10-02 | Disposition: A | Discharge status: Home / Self Care | Payer: Medicaid Other | Attending: Emergency Medicine | Admitting: Emergency Medicine

## 2020-10-02 DIAGNOSIS — R5383 Other fatigue: Secondary | ICD-10-CM | POA: Diagnosis not present

## 2020-10-02 DIAGNOSIS — Z87891 Personal history of nicotine dependence: Secondary | ICD-10-CM | POA: Insufficient documentation

## 2020-10-02 DIAGNOSIS — Z79899 Other long term (current) drug therapy: Secondary | ICD-10-CM | POA: Diagnosis not present

## 2020-10-02 DIAGNOSIS — R410 Disorientation, unspecified: Secondary | ICD-10-CM | POA: Diagnosis not present

## 2020-10-02 DIAGNOSIS — Z7901 Long term (current) use of anticoagulants: Secondary | ICD-10-CM | POA: Diagnosis not present

## 2020-10-02 DIAGNOSIS — Z20822 Contact with and (suspected) exposure to covid-19: Secondary | ICD-10-CM | POA: Diagnosis not present

## 2020-10-02 DIAGNOSIS — R4182 Altered mental status, unspecified: Secondary | ICD-10-CM | POA: Diagnosis present

## 2020-10-02 LAB — URINALYSIS, COMPLETE (UACMP) WITH MICROSCOPIC
Bilirubin Urine: NEGATIVE
Glucose, UA: NEGATIVE mg/dL
Hgb urine dipstick: NEGATIVE
Ketones, ur: NEGATIVE mg/dL
Leukocytes,Ua: NEGATIVE
Nitrite: NEGATIVE
Protein, ur: NEGATIVE mg/dL
Specific Gravity, Urine: 1.027 (ref 1.005–1.030)
pH: 6 (ref 5.0–8.0)

## 2020-10-02 LAB — CBC WITH DIFFERENTIAL/PLATELET
Abs Immature Granulocytes: 0.01 10*3/uL (ref 0.00–0.07)
Basophils Absolute: 0.1 10*3/uL (ref 0.0–0.1)
Basophils Relative: 1 %
Eosinophils Absolute: 0.2 10*3/uL (ref 0.0–0.5)
Eosinophils Relative: 3 %
HCT: 44.3 % (ref 39.0–52.0)
Hemoglobin: 15.2 g/dL (ref 13.0–17.0)
Immature Granulocytes: 0 %
Lymphocytes Relative: 38 %
Lymphs Abs: 2.9 10*3/uL (ref 0.7–4.0)
MCH: 31.3 pg (ref 26.0–34.0)
MCHC: 34.3 g/dL (ref 30.0–36.0)
MCV: 91.3 fL (ref 80.0–100.0)
Monocytes Absolute: 0.6 10*3/uL (ref 0.1–1.0)
Monocytes Relative: 8 %
Neutro Abs: 3.9 10*3/uL (ref 1.7–7.7)
Neutrophils Relative %: 50 %
Platelets: 322 10*3/uL (ref 150–400)
RBC: 4.85 MIL/uL (ref 4.22–5.81)
RDW: 13.7 % (ref 11.5–15.5)
WBC: 7.8 10*3/uL (ref 4.0–10.5)
nRBC: 0 % (ref 0.0–0.2)

## 2020-10-02 LAB — COMPREHENSIVE METABOLIC PANEL
ALT: 44 U/L (ref 0–44)
AST: 28 U/L (ref 15–41)
Albumin: 4.2 g/dL (ref 3.5–5.0)
Alkaline Phosphatase: 97 U/L (ref 38–126)
Anion gap: 6 (ref 5–15)
BUN: 15 mg/dL (ref 6–20)
CO2: 29 mmol/L (ref 22–32)
Calcium: 8.9 mg/dL (ref 8.9–10.3)
Chloride: 105 mmol/L (ref 98–111)
Creatinine, Ser: 0.89 mg/dL (ref 0.61–1.24)
GFR, Estimated: >60 mL/min (ref >60)
Glucose, Bld: 107 mg/dL — ABNORMAL HIGH (ref 70–99)
Potassium: 4.1 mmol/L (ref 3.5–5.1)
Sodium: 140 mmol/L (ref 135–145)
Total Bilirubin: 0.8 mg/dL (ref 0.3–1.2)
Total Protein: 7 g/dL (ref 6.5–8.1)

## 2020-10-02 LAB — RESP PANEL BY RT-PCR (FLU A&B, COVID) ARPGX2
Influenza A by PCR: NEGATIVE
Influenza B by PCR: NEGATIVE
SARS Coronavirus 2 by RT PCR: NEGATIVE

## 2020-10-02 IMAGING — CT CT HEAD W/O CM
3 series · 15 of 47 positions shown, 18 images · non-contrast
Comparison: MRI head dated [DATE]

CLINICAL DATA: Mental status change

EXAM:
CT HEAD WITHOUT CONTRAST
TECHNIQUE: Contiguous axial images were obtained from the base of the skull
through the vertex without intravenous contrast.

[Series 2: head wo · axial · 0.44mm/px · z∈[+319,+449]mm · 9 of 32 slices shown, 12 images]
[im 3/32  brain]
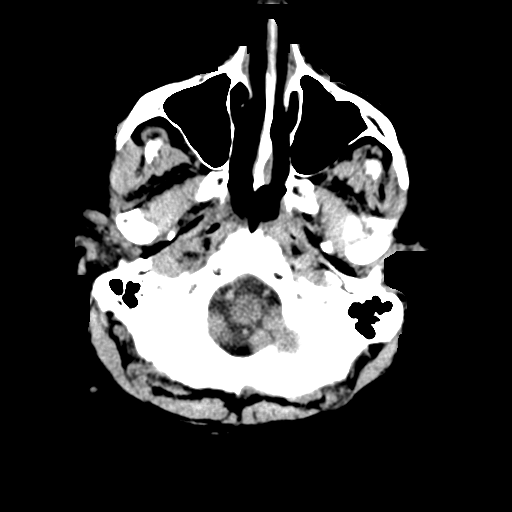
[im 3/32  bone]
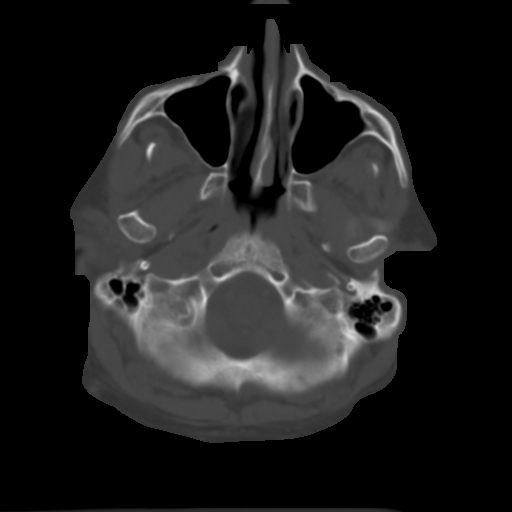
[im 6/32  brain]
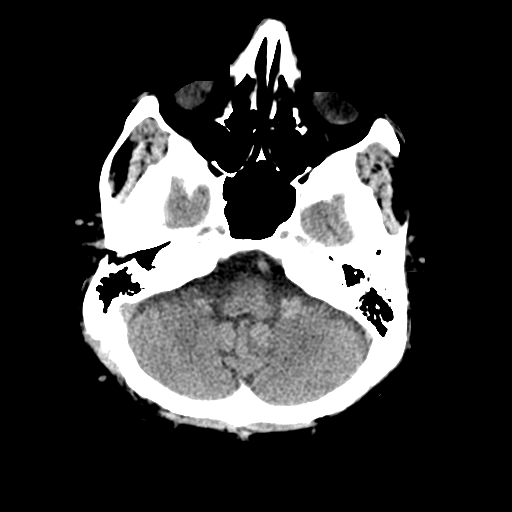
[im 9/32  brain]
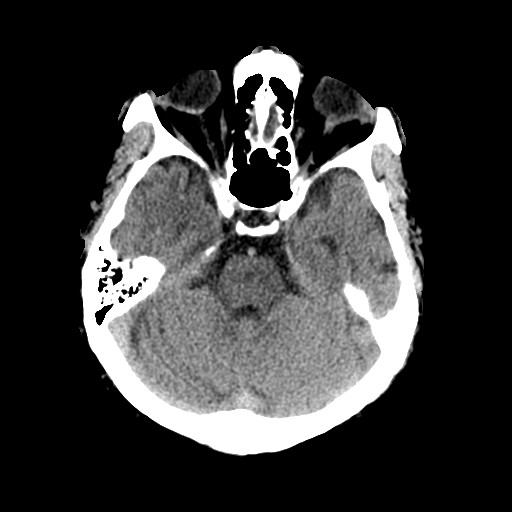
[im 12/32  brain]
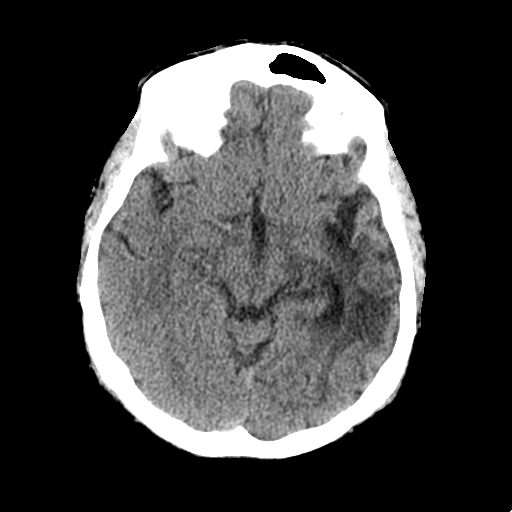
[im 17/32  brain]
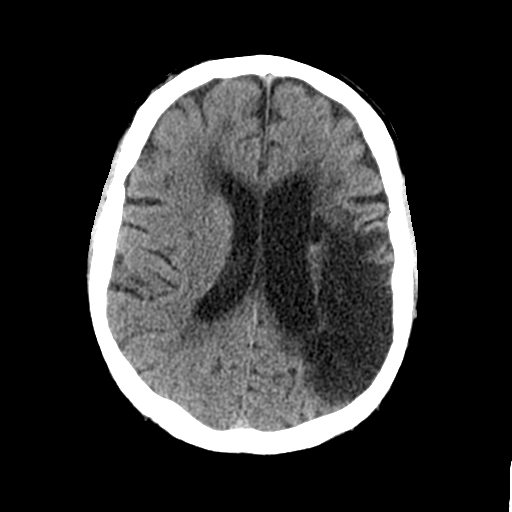
[im 17/32  bone]
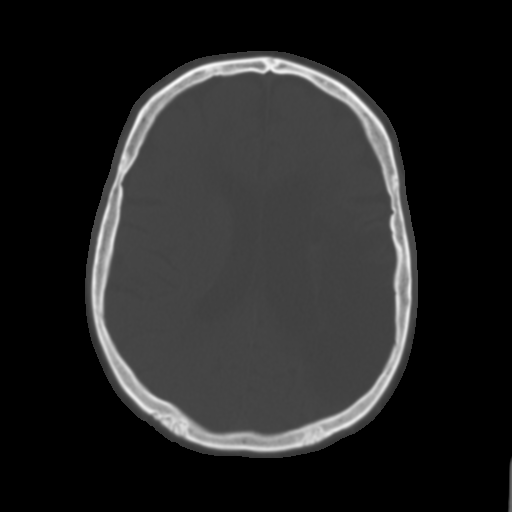
[im 20/32  brain]
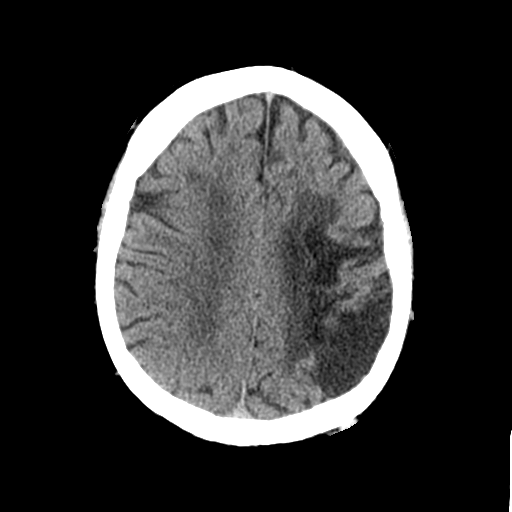
[im 23/32  brain]
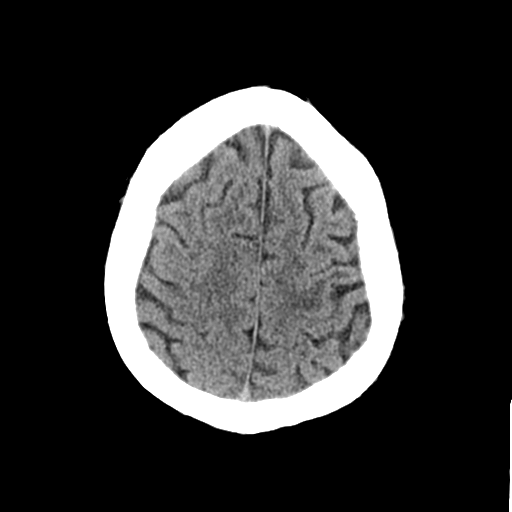
[im 26/32  brain]
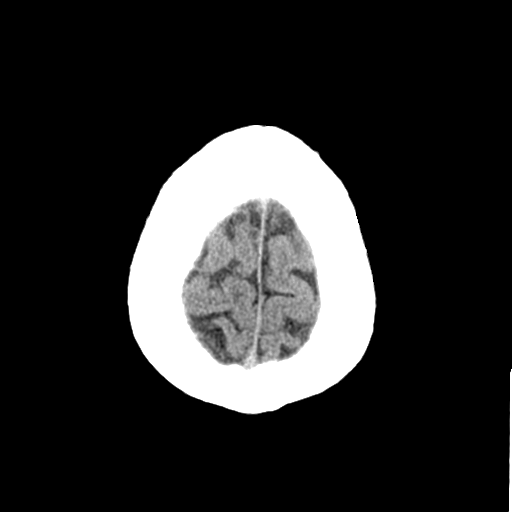
[im 29/32  brain]
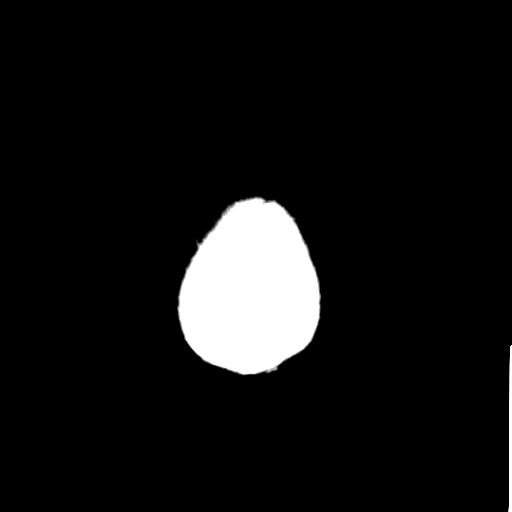
[im 29/32  bone]
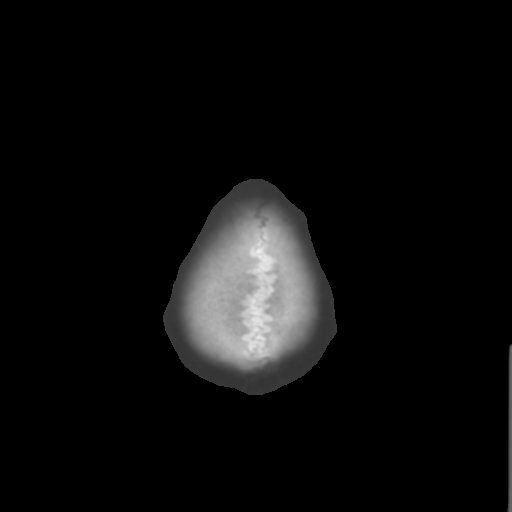

[Series 4: coronal soft tissue · coronal · 0.31mm/px · 3 of 62 slices shown]
[im 21/62  brain]
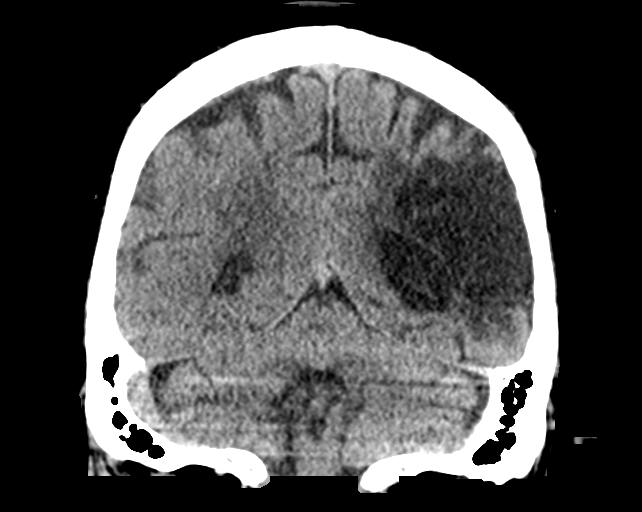
[im 28/62  brain]
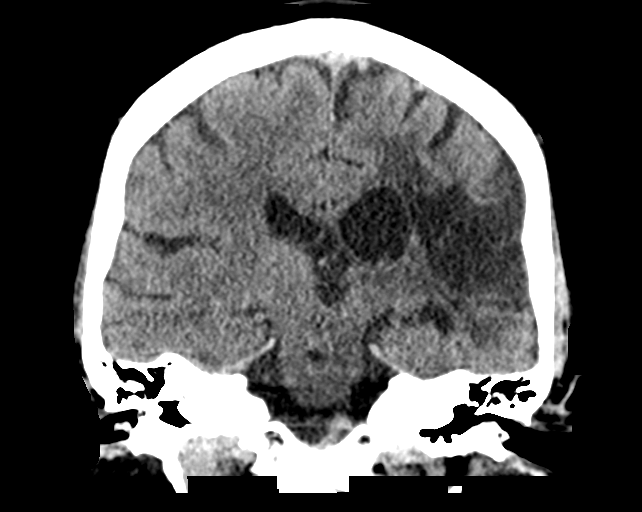
[im 34/62  brain]
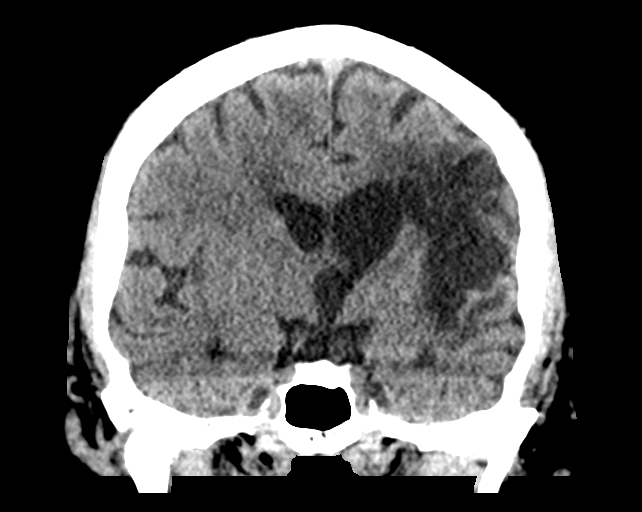

[Series 5: sagittal soft tissue · sagittal · 0.33mm/px · 3 of 53 slices shown]
[im 18/53  brain]
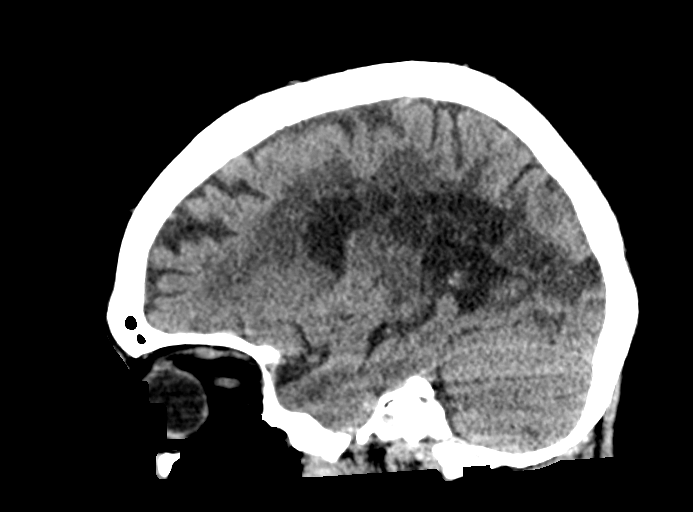
[im 27/53  brain]
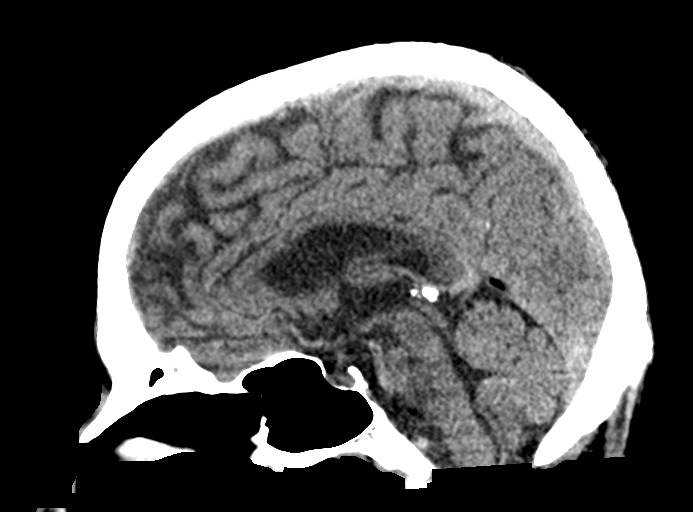
[im 35/53  brain]
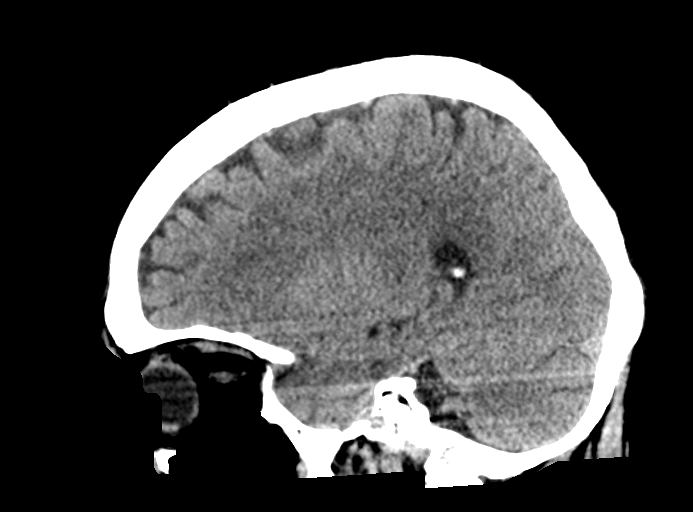

[15 of 47 positions shown; findings below may reference images not displayed]

FINDINGS: Brain: Chronic left MCA territory infarct. Chronic small vessel
ischemic change. No evidence of acute infarction, hemorrhage,
hydrocephalus, extra-axial collection or mass lesion/mass effect.

Vascular: No hyperdense vessel or unexpected calcification.

Skull: Normal. Negative for fracture or focal lesion.

Sinuses/Orbits: No acute finding.

Other: None.
IMPRESSION: No acute intracranial abnormality.

## 2020-10-02 IMAGING — DX DG CHEST 1V PORT
1 series · 1 of 1 positions shown · non-contrast
Comparison: [DATE]

CLINICAL DATA: Weakness, altered mental status

EXAM:
PORTABLE CHEST 1 VIEW

[chest ap]
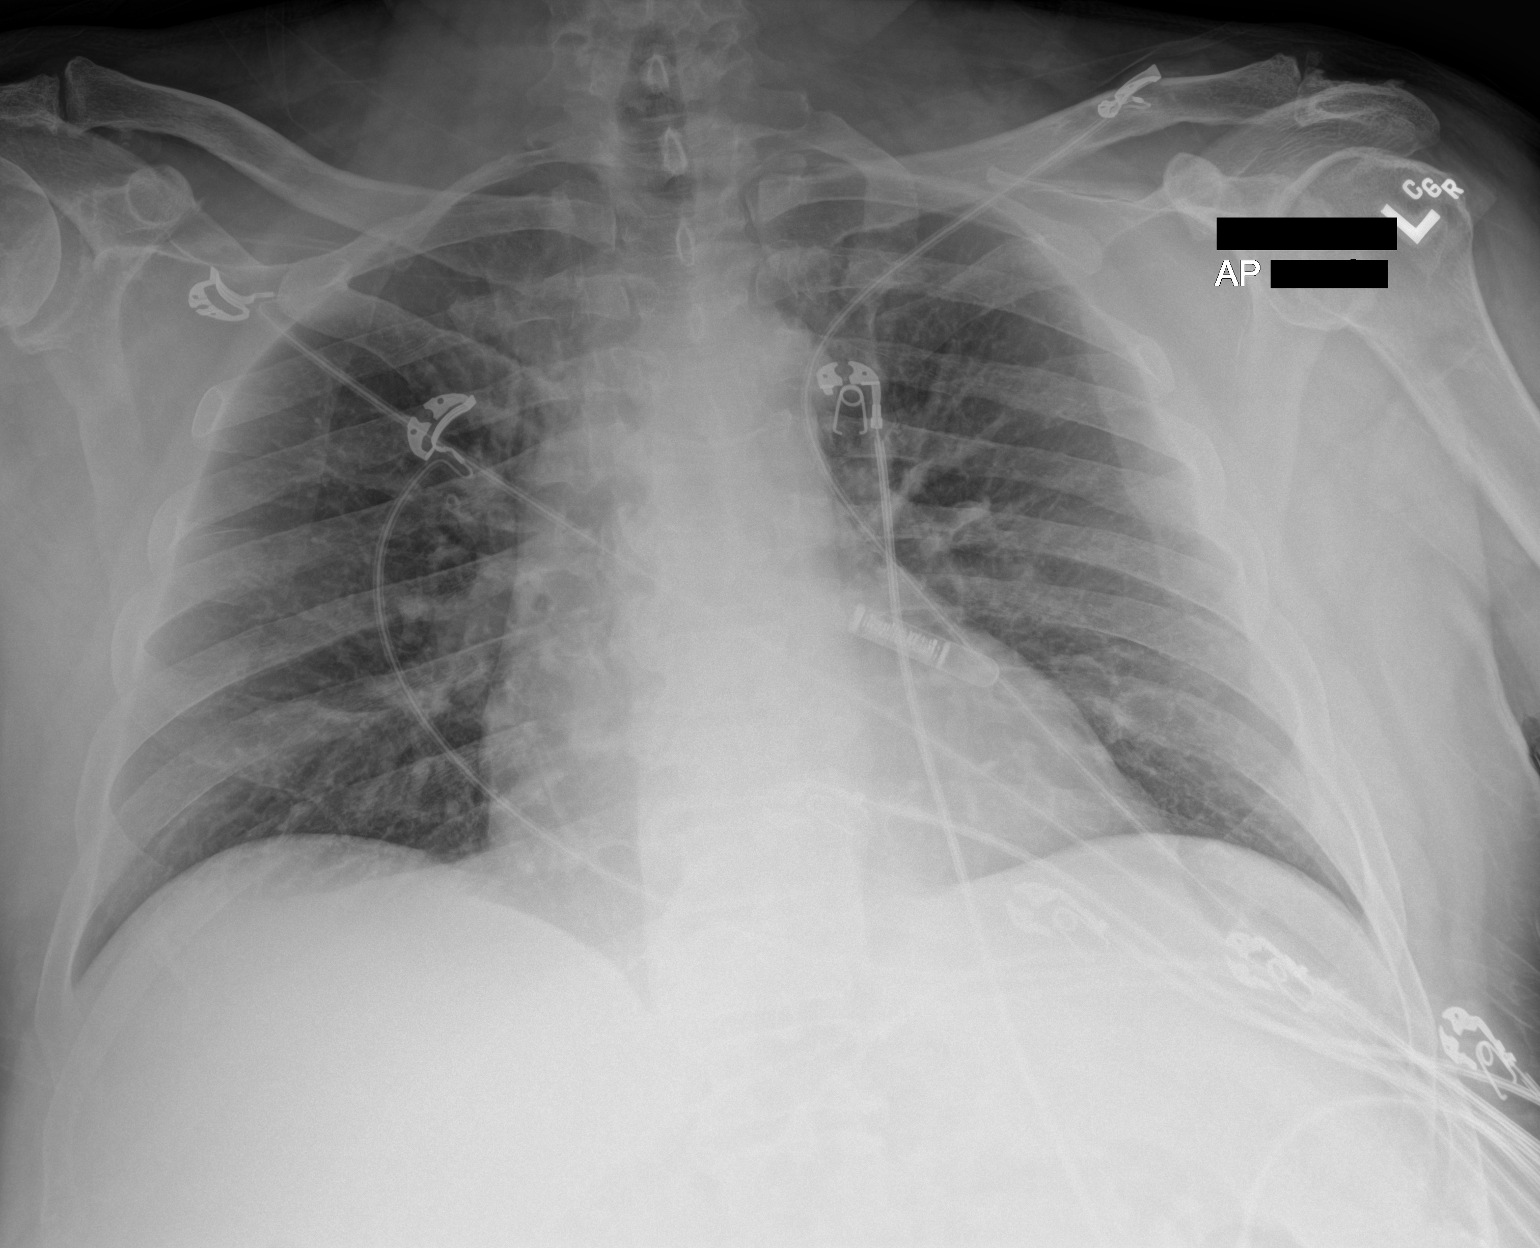

[1 of 1 positions shown; findings below may reference images not displayed]

FINDINGS: Heart and mediastinal contours are within normal limits. No focal
opacities or effusions. No acute bony abnormality. Loop recorder
device projects over the left chest.
IMPRESSION: No active disease.

## 2020-10-02 NOTE — ED Triage Notes (Signed)
Patient to ED via [facility] for AMS that started last night. Patient has hx of stroke with speech deficits (non-verbal) and right sided weakness from Feb of 2021.

## 2020-10-02 NOTE — Discharge Instructions (Addendum)
Your CT scan of the head, chest xray, covid test, and labs were all okay today.

## 2020-10-02 NOTE — ED Provider Notes (Signed)
[facility] Emergency Department Provider Note  ____________________________________________  Time seen: Approximately 12:30 PM  I have reviewed the triage vital signs and the nursing notes.   HISTORY  Chief Complaint Altered Mental Status    Level 5 Caveat: Portions of the History and Physical including HPI and review of systems are unable to be completely obtained due to patient baseline nonverbal due to prior stroke HPI Jonathan Newman is a 60 y.o. 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  She reports that since yesterday he seems confused, lower energy, sleeping more.  No recent illness or sick contacts.  Does have a home health aide that visits.    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 No pertinent surgical history.   Prior to Admission medications   Medication Sig Start Date End Date Taking? Authorizing Provider  ACETAMINOPHEN PO Take 650 mg by mouth every 6 (six) hours as needed.   Yes [provider]  atorvastatin (LIPITOR) 40 MG tablet Take 40 mg by mouth at bedtime.   Yes [provider]  divalproex (DEPAKOTE) 250 MG DR tablet Take by mouth. 09/18/20 10/18/20 Yes [provider]  ELIQUIS 5 MG TABS tablet Take 5 mg by mouth 2  (two) times daily. 09/04/20  Yes [provider]  lisinopril (ZESTRIL) 20 MG tablet  06/01/20  Yes [provider]  loratadine (CLARITIN) 10 MG tablet Take 10 mg by mouth daily. 06/09/20  Yes [provider]  mirabegron ER (MYRBETRIQ) 50 MG TB24 tablet Take 1 tablet (50 mg total) by mouth daily. 09/27/20  Yes Sondra Come, MD  ondansetron (ZOFRAN ODT) 4 MG disintegrating tablet Take 1 tablet (4 mg total) by mouth every 8 (eight) hours as needed for nausea or vomiting. 01/31/20  Yes Shaune Pollack, MD  PARoxetine (PAXIL) 10 MG tablet Take 10 mg by mouth daily. 06/09/20  Yes [provider]  sennosides-docusate sodium (SENOKOT-S) 8.6-50 MG tablet Take 1 tablet by mouth as directed. Every 3 days   Yes [provider]  zolpidem (AMBIEN) 10 MG tablet Take 10 mg by mouth at bedtime as needed. 08/28/20  Yes [provider]  cephALEXin (KEFLEX) 500 MG capsule Take 1 capsule (500 mg total) by mouth 3 (three) times daily. Patient not taking: No sig reported 09/12/20   Annice Needy, MD  divalproex (DEPAKOTE) 125 MG DR tablet Take 125 mg by mouth at bedtime. Patient not taking: Reported on 10/02/2020    [provider]     Allergies Patient has no known allergies.   No family history on file.  Social History Social History   Tobacco Use   Smoking status: Former   Smokeless tobacco: Never  Substance Use Topics   Alcohol use: Not Currently   Drug use: Not Currently    Review of Systems Level 5 Caveat: Portions of the History and Physical including HPI and review of systems are unable to be completely obtained due to patient being a poor historian   Constitutional:   No known fever.  ENT:   No rhinorrhea. Cardiovascular:   No chest pain or syncope. Respiratory:   No dyspnea or cough. Gastrointestinal:   Negative for abdominal pain, vomiting and diarrhea.  Musculoskeletal:   Negative for focal pain or  swelling ____________________________________________   PHYSICAL EXAM:  VITAL SIGNS: ED Triage Vitals  Enc Vitals Group     BP 10/02/20 1032 (!) 146/87     Pulse Rate 10/02/20 1032 (!) 44     Resp 10/02/20 1032 18     Temp 10/02/20 1032 98.3 F (36.8 C)     Temp Source 10/02/20 1032 Oral     SpO2 10/02/20 1032 97 %     Weight 10/02/20 1026 222 lb 10.6 oz (101 kg)     Height 10/02/20 1026 5\' 8"  (1.727 m)     Head Circumference --      Peak Flow --      Pain Score --      Pain Loc --      Pain Edu? --      Excl. in GC? --     Vital signs reviewed, nursing assessments reviewed.   Constitutional:   Awake.  Not oriented. Non-toxic appearance. Eyes:   Conjunctivae are normal. EOMI. PERRL. ENT      Head:   Normocephalic and atraumatic.      Nose:   No congestion/rhinnorhea.       Mouth/Throat:   MMM, no pharyngeal erythema. No peritonsillar mass.       Neck:   No meningismus. Full ROM. Hematological/Lymphatic/Immunilogical:   No cervical lymphadenopathy. Cardiovascular:   Bradycardia heart rate 50. Symmetric bilateral radial and DP pulses.  No murmurs. Cap refill less than 2 seconds. Respiratory:   Normal respiratory effort without tachypnea/retractions. Breath sounds are clear and equal bilaterally. No wheezes/rales/rhonchi. Gastrointestinal:   Soft and nontender. Non distended. There is no CVA tenderness.  No rebound, rigidity, or guarding. Genitourinary:   deferred Musculoskeletal:   Normal range of motion in all extremities. No joint effusions.  No lower extremity tenderness.  No edema. Neurologic:   Aphasia. Right-sided paralysis Follows commands.  Motor grossly intact on left side. No acute focal neurologic deficits are appreciated.  Skin:    Skin is warm, dry and intact. No rash noted.  No petechiae, purpura, or bullae.  ____________________________________________    LABS (pertinent positives/negatives) (all labs ordered are listed, but only abnormal results are  displayed) Labs Reviewed  COMPREHENSIVE METABOLIC PANEL - Abnormal; Notable for the following components:      Result Value   Glucose, Bld 107 (*)    All other components within normal limits  URINALYSIS, COMPLETE (UACMP) WITH MICROSCOPIC - Abnormal; Notable for the following components:   Color, Urine YELLOW (*)    APPearance HAZY (*)    Bacteria, UA RARE (*)    All other components within normal limits  RESP PANEL BY RT-PCR (FLU A&B, COVID) [id number]  URINE CULTURE  CBC WITH DIFFERENTIAL/PLATELET   ____________________________________________   EKG  Interpreted by me Sinus bradycardia rate of 47, normal axis and intervals.  Poor R wave progression.  Normal ST segments and T waves.  No ischemic changes.  ____________________________________________    RADIOLOGY  CT HEAD WO CONTRAST ( )  Result Date: 10/02/2020 CLINICAL DATA:  Mental status change EXAM: CT HEAD WITHOUT CONTRAST TECHNIQUE: Contiguous axial images were obtained from the base of the skull through the vertex without intravenous contrast. COMPARISON:  MRI head dated September 29, 2020 FINDINGS: Brain: Chronic left MCA territory infarct. Chronic small vessel ischemic change. No evidence of acute infarction, hemorrhage, hydrocephalus, extra-axial collection or mass lesion/mass effect. Vascular: No hyperdense vessel or unexpected calcification. Skull: Normal. Negative for fracture or focal lesion. Sinuses/Orbits: No acute finding. Other: None. IMPRESSION: No acute intracranial abnormality. Electronically Signed   By: Allegra Lai M.D.   On: 10/02/2020 12:55   DG Chest Portable 1 View  Result Date: 10/02/2020 CLINICAL DATA:  Weakness, altered mental status EXAM: PORTABLE CHEST 1 VIEW COMPARISON:  01/31/2020 FINDINGS: Heart and mediastinal contours are within normal limits. No focal opacities or effusions. No acute bony abnormality. Loop recorder device projects over the left chest. IMPRESSION: No active disease.  Electronically Signed   By: Charlett Nose M.D.   On: 10/02/2020 11:23    ____________________________________________   PROCEDURES Procedures  ____________________________________________  DIFFERENTIAL DIAGNOSIS   Intracranial hemorrhage, dehydration, viral illness, electrolyte abnormality, anemia  CLINICAL IMPRESSION / ASSESSMENT AND PLAN / ED COURSE  Medications ordered in the ED: Medications - No data to display  Pertinent labs & imaging results that were available during my care of the patient were reviewed by me and considered in my medical decision making (see chart for details).   Jonathan Newman was evaluated in Emergency Department on 10/02/2020 for the symptoms described in the history of present illness. He was evaluated in the context of the global COVID-19 pandemic, which necessitated consideration that the patient might be at risk for infection with the SARS-CoV-2 virus that causes COVID-19. Institutional protocols and algorithms that pertain to the evaluation of patients at risk for COVID-19 are in a state of rapid change based on information released by regulatory bodies including the CDC and federal and state organizations. These policies and algorithms were followed during the patient's care in the ED.   Patient presents with decreased energy level.  Sister at bedside is concerned that he may be confused, but this is difficult to assess due to chronic aphasia.  He does follow commands and seems to respond appropriately to the conversation.  No recent trauma.  Will check labs, chest x-ray, CT head.  Clinical Course as of 10/02/20 1519  Mon Oct 02, 2020  1320 CT head and chest x-ray unremarkable [PS]    Clinical Course User Index [PS] Sharman Cheek, MD    ----------------------------------------- 3:19 PM on 10/02/2020 ----------------------------------------- Labs unremarkable.  COVID and flu negative.  Throughout the patient's time in the ED, he is able to  follow the conversation and participate appropriately despite his aphasia and motor deficits.  As I discussed discharge he is attempting to assist removing his monitor needs.  I think he is stable and recommend follow-up with his neurologist this week.  I suspect he may have a mild viral illness causing malaise and fatigue   ____________________________________________   FINAL CLINICAL IMPRESSION(S) / ED DIAGNOSES    Final diagnoses:  Confusion  Fatigue, unspecified type     ED Discharge Orders     None       Portions of this note were generated with dragon dictation software. Dictation errors may occur despite best attempts at proofreading.   Sharman Cheek, MD 10/02/20 1520

## 2020-10-02 NOTE — ED Notes (Signed)
Patient to CT at this time

## 2020-10-03 LAB — URINE CULTURE: Culture: NO GROWTH

## 2020-10-04 ENCOUNTER — Ambulatory Visit (INDEPENDENT_AMBULATORY_CARE_PROVIDER_SITE_OTHER): Payer: Medicaid Other | Admitting: Nurse Practitioner

## 2020-10-04 ENCOUNTER — Ambulatory Visit: Payer: Medicaid Other | Admitting: Physical Therapy

## 2020-10-04 ENCOUNTER — Other Ambulatory Visit: Payer: Self-pay

## 2020-10-04 DIAGNOSIS — L97211 Non-pressure chronic ulcer of right calf limited to breakdown of skin: Secondary | ICD-10-CM

## 2020-10-05 NOTE — Progress Notes (Signed)
History of Present Illness  There is no documented history at this time  Assessments & Plan   There are no diagnoses linked to this encounter.    Additional instructions  Subjective:  Patient presents with venous ulcer of the Right lower extremity.    Procedure:  3 layer unna wrap was placed Right lower extremity.   Plan:   Follow up in one week.   

## 2020-10-09 ENCOUNTER — Encounter (INDEPENDENT_AMBULATORY_CARE_PROVIDER_SITE_OTHER): Payer: Self-pay | Admitting: Nurse Practitioner

## 2020-10-09 ENCOUNTER — Ambulatory Visit: Payer: Medicaid Other

## 2020-10-09 ENCOUNTER — Encounter: Payer: Medicaid Other | Admitting: Speech Pathology

## 2020-10-10 ENCOUNTER — Other Ambulatory Visit: Payer: Self-pay

## 2020-10-10 ENCOUNTER — Ambulatory Visit (INDEPENDENT_AMBULATORY_CARE_PROVIDER_SITE_OTHER): Payer: Medicaid Other | Admitting: Vascular Surgery

## 2020-10-10 VITALS — BP 125/78 | HR 51 | Ht 68.0 in | Wt 222.0 lb

## 2020-10-10 DIAGNOSIS — L97211 Non-pressure chronic ulcer of right calf limited to breakdown of skin: Secondary | ICD-10-CM

## 2020-10-10 DIAGNOSIS — I87009 Postthrombotic syndrome without complications of unspecified extremity: Secondary | ICD-10-CM | POA: Diagnosis not present

## 2020-10-10 DIAGNOSIS — I639 Cerebral infarction, unspecified: Secondary | ICD-10-CM | POA: Diagnosis not present

## 2020-10-10 DIAGNOSIS — I82411 Acute embolism and thrombosis of right femoral vein: Secondary | ICD-10-CM

## 2020-10-10 NOTE — Progress Notes (Signed)
MRN : [id number]  Jonathan Newman is a 60 y.o. (October 03, 1960) male who presents with chief complaint of  Chief Complaint  Patient presents with   Follow-up    4wk Rt unna boot  check  .  History of Present Illness: Patient returns today in follow up of right leg swelling and ulceration.  His swelling is almost completely gone at this point after several weeks in Unna boots.  His ulceration has healed with the exception of a tiny pinhole on the medial aspect of the right mid calf.  He is doing well.  He is not in a good mood today for a variety of reasons but overall he is doing well.  Current Outpatient Medications  Medication Sig Dispense Refill   ACETAMINOPHEN PO Take 650 mg by mouth every 6 (six) hours as needed.     atorvastatin (LIPITOR) 40 MG tablet Take 40 mg by mouth at bedtime.     divalproex (DEPAKOTE) 250 MG DR tablet Take by mouth.     ELIQUIS 5 MG TABS tablet Take 5 mg by mouth 2 (two) times daily.     lisinopril (ZESTRIL) 20 MG tablet      loratadine (CLARITIN) 10 MG tablet Take 10 mg by mouth daily.     mirabegron ER (MYRBETRIQ) 50 MG TB24 tablet Take 1 tablet (50 mg total) by mouth daily. 30 tablet 0   ondansetron (ZOFRAN ODT) 4 MG disintegrating tablet Take 1 tablet (4 mg total) by mouth every 8 (eight) hours as needed for nausea or vomiting. 20 tablet 0   PARoxetine (PAXIL) 10 MG tablet Take 10 mg by mouth daily.     sennosides-docusate sodium (SENOKOT-S) 8.6-50 MG tablet Take 1 tablet by mouth as directed. Every 3 days     zolpidem (AMBIEN) 10 MG tablet Take 10 mg by mouth at bedtime as needed.     cephALEXin (KEFLEX) 500 MG capsule Take 1 capsule (500 mg total) by mouth 3 (three) times daily. (Patient not taking: No sig reported) 42 capsule 1   divalproex (DEPAKOTE) 125 MG DR tablet Take 125 mg by mouth at bedtime. (Patient not taking: No sig reported)     No current facility-administered medications for this visit.    Past Medical History:  Diagnosis Date    Constipation    CVA (cerebral vascular accident) (HCC)    Diarrhea    Dysarthria and anarthria    Hemiplegia and hemiparesis following cerebral infarction affecting right dominant side (HCC)    Hyperlipidemia    Pain in right leg     No past surgical history on file.   Social History           Tobacco Use   Smoking status: Former   Smokeless tobacco: Never  Substance Use Topics   Alcohol use: Not Currently   Drug use: Not Currently    Family History Sister healthy Mother died 2018/05/22   No Known Allergies   REVIEW OF SYSTEMS (Negative unless checked)   Constitutional: [] Weight loss  [] Fever  [] Chills Cardiac: [] Chest pain   [] Chest pressure   [] Palpitations   [] Shortness of breath when laying flat   [] Shortness of breath at rest   [] Shortness of breath with exertion. Vascular:  [] Pain in legs with walking   [] Pain in legs at rest   [] Pain in legs when laying flat   [] Claudication   [] Pain in feet when walking  [] Pain in feet at rest  [] Pain in feet when laying flat   [  x]History of DVT   [x] Phlebitis   [x] Swelling in legs   [] Varicose veins   [x] Non-healing ulcers Pulmonary:   [] Uses home oxygen   [] Productive cough   [] Hemoptysis   [] Wheeze  [] COPD   [] Asthma Neurologic:  [] Dizziness  [] Blackouts   [] Seizures   [x] History of stroke   [] History of TIA  [] Aphasia   [] Temporary blindness   [] Dysphagia   [x] Weakness or numbness in arms   [x] Weakness or numbness in legs Musculoskeletal:  [] Arthritis   [] Joint swelling   [] Joint pain   [] Low back pain Hematologic:  [] Easy bruising  [] Easy bleeding   [] Hypercoagulable state   [] Anemic  [] Hepatitis Gastrointestinal:  [] Blood in stool   [] Vomiting blood  [] Gastroesophageal reflux/heartburn   [] Abdominal pain Genitourinary:  [] Chronic kidney disease   [] Difficult urination  [] Frequent urination  [] Burning with urination   [] Hematuria Skin:  [] Rashes   [x] Ulcers   [x] Wounds Psychological:  [] History of anxiety   []  History of major  depression.     Physical Examination  BP 125/78   Pulse (!) 51   Ht 5\' 8"  (1.727 m)   Wt 222 lb (100.7 kg)   BMI 33.75 kg/m  Gen:  WD/WN, NAD Head: [place]/AT, No temporalis wasting. Ear/Nose/Throat: Hearing grossly intact, nares w/o erythema or drainage Eyes: Conjunctiva clear. Sclera non-icteric Neck: Supple.  Trachea midline Pulmonary:  Good air movement, no use of accessory muscles.  Cardiac: RRR, no JVD Vascular:  Vessel Right Left  Radial Palpable Palpable               Musculoskeletal: Right-sided hemiparesis.  Right leg swelling is now minimal after several weeks in Unna boots and the skin is healed except for a pinhole size opening that has some mild bleeding. Neurologic: Sensation grossly intact in extremities.  Aphasic.  Right hemiparetic. Psychiatric: Judgment intact, Mood & affect appropriate for pt's clinical situation. Dermatologic: Previous wounds have basically completely healed at this point.      Labs Recent Results (from the past 2160 hour(s))  Comprehensive metabolic panel     Status: Abnormal   Collection Time: 10/02/20 10:28 AM  Result Value Ref Range   Sodium 140 135 - 145 mmol/L   Potassium 4.1 3.5 - 5.1 mmol/L   Chloride 105 98 - 111 mmol/L   CO2 29 22 - 32 mmol/L   Glucose, Bld 107 (H) 70 - 99 mg/dL    Comment: Glucose reference range applies only to samples taken after fasting for at least 8 hours.   BUN 15 6 - 20 mg/dL   Creatinine, Ser 0.61 - 1.24 mg/dL   Calcium 8.9 8.9 - mg/dL   Total Protein 7.0 6.5 - 8.1 g/dL   Albumin 4.2 3.5 - 5.0 g/dL   AST 28 15 - 41 U/L   ALT 44 0 - 44 U/L   Alkaline Phosphatase 97 38 - 126 U/L   Total Bilirubin 0.8 0.3 - 1.2 mg/dL   GFR, Estimated  mL/min    Comment: (NOTE) Calculated using the CKD-EPI Creatinine Equation (2021)    Anion gap 6 5 - 15    Comment: Performed at [facility], [address] [place],   CBC with Differential     Status: None    Collection Time: 10/02/20 10:28 AM  Result Value Ref Range   WBC 7.8 4.0 - 10.5 K/uL   RBC 4.85 4.22 - 5.81 MIL/uL   Hemoglobin 15.2 13.0 - 17.0 g/dL   HCT  44.3 39.0 - 52.0 %   MCV 91.3 80.0 - 100.0 fL   MCH 31.3 26.0 - 34.0 pg   MCHC 34.3 30.0 - 36.0 g/dL   RDW 54.6 27.0 - 35.0 %   Platelets 322 150 - 400 K/uL   nRBC 0.0 0.0 - 0.2 %   Neutrophils Relative % 50 %   Neutro Abs 3.9 1.7 - 7.7 K/uL   Lymphocytes Relative 38 %   Lymphs Abs 2.9 0.7 - 4.0 K/uL   Monocytes Relative 8 %   Monocytes Absolute 0.6 0.1 - 1.0 K/uL   Eosinophils Relative 3 %   Eosinophils Absolute 0.2 0.0 - 0.5 K/uL   Basophils Relative 1 %   Basophils Absolute 0.1 0.0 - 0.1 K/uL   Immature Granulocytes 0 %   Abs Immature Granulocytes 0.01 0.00 - 0.07 K/uL    Comment: Performed at [facility], [address] [place], [place] [address]  Resp Panel by RT-PCR (Flu A&B, Covid) Nasopharyngeal Swab     Status: None   Collection Time: 10/02/20 10:52 AM   Specimen: Nasopharyngeal Swab; Nasopharyngeal(NP) swabs in vial transport medium  Result Value Ref Range   SARS Coronavirus 2 by RT PCR NEGATIVE NEGATIVE    Comment: (NOTE) SARS-CoV-2 target nucleic acids are NOT DETECTED.  The SARS-CoV-2 RNA is generally detectable in upper respiratory specimens during the acute phase of infection. The lowest concentration of SARS-CoV-2 viral copies this assay can detect is 138 copies/mL. A negative result does not preclude SARS-Cov-2 infection and should not be used as the sole basis for treatment or other patient management decisions. A negative result may occur with  improper specimen collection/handling, submission of specimen other than nasopharyngeal swab, presence of viral mutation(s) within the areas targeted by this assay, and inadequate number of viral copies(<138 copies/mL). A negative result must be combined with clinical observations, patient history, and epidemiological information. The expected  result is Negative.  Fact Sheet for Patients:  BloggerCourse.com  Fact Sheet for Healthcare Providers:  SeriousBroker.it  This test is no t yet approved or cleared by the [place] FDA and  has been authorized for detection and/or diagnosis of SARS-CoV-2 by FDA under an Emergency Use Authorization (EUA). This EUA will remain  in effect (meaning this test can be used) for the duration of the COVID-19 declaration under Section 564(b)(1) of the Act, 21 U.S.C.section 360bbb-3(b)(1), unless the authorization is terminated  or revoked sooner.       Influenza A by PCR NEGATIVE NEGATIVE   Influenza B by PCR NEGATIVE NEGATIVE    Comment: (NOTE) The Xpert Xpress SARS-CoV-2/FLU/RSV plus assay is intended as an aid in the diagnosis of influenza from Nasopharyngeal swab specimens and should not be used as a sole basis for treatment. Nasal washings and aspirates are unacceptable for Xpert Xpress SARS-CoV-2/FLU/RSV testing.  Fact Sheet for Patients: BloggerCourse.com  Fact Sheet for Healthcare Providers: SeriousBroker.it  This test is not yet approved or cleared by the [place] FDA and has been authorized for detection and/or diagnosis of SARS-CoV-2 by FDA under an Emergency Use Authorization (EUA). This EUA will remain in effect (meaning this test can be used) for the duration of the COVID-19 declaration under Section 564(b)(1) of the Act, 21 U.S.C. section 360bbb-3(b)(1), unless the authorization is terminated or revoked.  Performed at [facility], [address], [place], [place] [address]   Urinalysis, Complete w Microscopic     Status: Abnormal   Collection Time: 10/02/20 12:57 PM  Result Value Ref Range   Color, Urine YELLOW (A) YELLOW   APPearance HAZY (A) CLEAR   Specific Gravity, Urine 1.027 1.005 - 1.030   pH 6.0 5.0 - 8.0   Glucose, UA NEGATIVE NEGATIVE  mg/dL   Hgb urine dipstick NEGATIVE NEGATIVE   Bilirubin Urine NEGATIVE NEGATIVE   Ketones, ur NEGATIVE NEGATIVE mg/dL   Protein, ur NEGATIVE NEGATIVE mg/dL   Nitrite NEGATIVE NEGATIVE   Leukocytes,Ua NEGATIVE NEGATIVE   RBC / HPF 6-10 0 - 5 RBC/hpf   WBC, UA 6-10 0 - 5 WBC/hpf   Bacteria, UA RARE (A) NONE SEEN   Squamous Epithelial / LPF 0-5 0 - 5   Mucus PRESENT    Ca Oxalate Crys, UA PRESENT    Sperm, UA PRESENT     Comment: Performed at [facility], [address], [place], [place] [address]  Urine Culture     Status: None   Collection Time: 10/02/20 12:57 PM   Specimen: Urine, Clean Catch  Result Value Ref Range   Specimen Description      URINE, CLEAN CATCH Performed at [facility], [address] [place], [place] [address]    Special Requests      NONE Performed at [facility], [address], [place], [place] [address]    Culture      NO GROWTH Performed at [facility], [address], [place], [place] [address]    Report Status 10/03/2020 FINAL     Radiology CT HEAD WO CONTRAST ( )  Result Date: 10/02/2020 CLINICAL DATA:  Mental status change EXAM: CT HEAD WITHOUT CONTRAST TECHNIQUE: Contiguous axial images were obtained from the base of the skull through the vertex without intravenous contrast. COMPARISON:  MRI head dated September 29, 2020 FINDINGS: Brain: Chronic left MCA territory infarct. Chronic small vessel ischemic change. No evidence of acute infarction, hemorrhage, hydrocephalus, extra-axial collection or mass lesion/mass effect. Vascular: No hyperdense vessel or unexpected calcification. Skull: Normal. Negative for fracture or focal lesion. Sinuses/Orbits: No acute finding. Other: None. IMPRESSION: No acute intracranial abnormality. Electronically Signed   By: Allegra Lai M.D.   On: 10/02/2020 12:55   MR BRAIN WO CONTRAST  Result Date: 09/29/2020 CLINICAL DATA:  Stroke-like symptoms. Additional history  provided: History of CVA with right-sided paralysis, expressive aphasia. Per patient's sister CVA in 2021. Evaluate new stroke-like symptoms per ordering physician. EXAM: MRI HEAD WITHOUT CONTRAST TECHNIQUE: Multiplanar, multiecho pulse sequences of the brain and surrounding structures were obtained without intravenous contrast. COMPARISON:  No pertinent prior exams available for comparison. FINDINGS: Brain: Mildly motion degraded exam. Mild generalized cerebral and cerebellar atrophy. Large chronic cortical/subcortical left MCA territory and left MCA/PCA watershed territory infarct within the left frontal, parietal, temporal and occipital lobes, as well as left insula and deep white matter tracts on the left. Associated cortical laminar necrosis and/or chronic blood products within the infarct territory. Superimposed chronic perforator infarct within the left centrum semiovale/corona radiata and basal ganglia. Mild chronic hemosiderin deposition at this site. Associated wallerian degeneration extending inferiorly to the level of the left medulla. Separate small chronic cortical infarct within the anterolateral left frontal lobe (MCA vascular territory). Background moderate multifocal T2/FLAIR hyperintensity within the cerebral white matter, nonspecific but compatible with chronic small vessel ischemic disease. Chronic infarct within the left thalamus. Chronic lacunar infarcts within the bilateral pons with background moderate chronic small vessel ischemic disease. There is no acute infarct. No evidence of an intracranial mass. No extra-axial fluid collection.  No midline shift. Vascular: Signal abnormality within the M1 left middle cerebral artery suggesting age-indeterminate high-grade stenosis or occlusion of this vessel. Skull and upper cervical spine: No focal suspicious marrow lesion. Sinuses/Orbits: Visualized orbits show no acute finding. 2 cm left maxillary sinus mucous retention cyst. Trace mucosal  thickening within the bilateral ethmoid air cells. Impression 9 will be called to the ordering clinician or representative by the Radiologist Assistant, and communication documented in the PACS or [employer]. IMPRESSION: Mildly motion degraded exam. No evidence of acute or recent subacute infarction. Large chronic left MCA territory and left MCA/PCA watershed territory cortical/subcortical infarct, as described. Superimposed chronic perforator infarct within the left centrum semiovale/corona radiata and left basal ganglia. Associated wallerian degeneration on the left. Separate small chronic cortical infarct within the anterolateral left frontal lobe. Background moderate chronic small vessel ischemic changes within the cerebral white matter. Chronic infarct within the left thalamus. Chronic lacunar infarcts within the pons bilaterally, with background moderate pontine chronic small-vessel ischemic changes. Mild generalized cerebral and cerebellar atrophy. Signal abnormality within the M1 left middle cerebral artery suggesting age-indeterminate high-grade stenosis or occlusion of this vessel. Paranasal sinus disease, as described. Electronically Signed   By: Jackey Loge D.O.   On: 09/29/2020 12:18   DG Chest Portable 1 View  Result Date: 10/02/2020 CLINICAL DATA:  Weakness, altered mental status EXAM: PORTABLE CHEST 1 VIEW COMPARISON:  01/31/2020 FINDINGS: Heart and mediastinal contours are within normal limits. No focal opacities or effusions. No acute bony abnormality. Loop recorder device projects over the left chest. IMPRESSION: No active disease. Electronically Signed   By: Charlett Nose M.D.   On: 10/02/2020 11:23    Assessment/Plan Stroke [facility] The patient had a fairly devastating stroke leaving him right hemiparetic.  I suspect that his DVT was actually shortly after the stroke and this is on his affected leg which she cannot move.  He would be at high risk of recurrent DVT if not on  anticoagulation due to his immobility.  I would agreed with continuous Eliquis therapy.   DVT (deep venous thrombosis) (HCC) Patient has now been on anticoagulation for 6 months but with his history of extensive DVT, persistent leg swelling, and devastating stroke with essentially no movement in his legs, he will remain on full anticoagulation for least another 6 months.  He will need some degree of anticoagulation indefinitely.  Lower limb ulcer, calf, right, limited to breakdown of skin (HCC) Ulcer has healed and swelling is markedly improved after several weeks in Unna boots.  Are going to come out of Unna boots today and see how he does.  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  When to try to come out of Unna boots the skin is healed and go to compression socks and the lymphedema pump daily.  We will see him back in a few months or sooner if his marked swelling or ulceration returns.    Festus Barren, MD  10/10/2020 3:51 PM    This note was created with Dragon medical transcription system.  Any errors from dictation are purely unintentional

## 2020-10-10 NOTE — Assessment & Plan Note (Signed)
Ulcer has healed and swelling is markedly improved after several weeks in Unna boots.  Are going to come out of Unna boots today and see how he does.

## 2020-10-10 NOTE — Assessment & Plan Note (Signed)
He has pretty pronounced postphlebitic syndrome of the right leg although it has gotten significantly better with several weeks in Unna boots.  When to try to come out of Unna boots the skin is healed and go to compression socks and the lymphedema pump daily.  We will see him back in a few months or sooner if his marked swelling or ulceration returns.

## 2020-10-11 ENCOUNTER — Ambulatory Visit: Payer: Medicaid Other

## 2020-10-18 ENCOUNTER — Ambulatory Visit: Payer: Medicaid Other

## 2020-10-20 ENCOUNTER — Ambulatory Visit: Payer: Medicaid Other

## 2020-10-25 ENCOUNTER — Encounter: Payer: Medicaid Other | Admitting: Speech Pathology

## 2020-10-25 ENCOUNTER — Ambulatory Visit: Payer: Medicaid Other

## 2020-10-25 ENCOUNTER — Ambulatory Visit: Payer: Medicaid Other | Attending: Family Medicine | Admitting: Speech Pathology

## 2020-10-25 ENCOUNTER — Encounter: Payer: Self-pay | Admitting: Speech Pathology

## 2020-10-25 ENCOUNTER — Other Ambulatory Visit: Payer: Self-pay

## 2020-10-25 VITALS — BP 155/91 | HR 50 | Resp 17 | Ht 68.0 in | Wt 222.0 lb

## 2020-10-25 DIAGNOSIS — R4701 Aphasia: Secondary | ICD-10-CM | POA: Diagnosis not present

## 2020-10-25 DIAGNOSIS — M6281 Muscle weakness (generalized): Secondary | ICD-10-CM | POA: Insufficient documentation

## 2020-10-25 DIAGNOSIS — R482 Apraxia: Secondary | ICD-10-CM | POA: Insufficient documentation

## 2020-10-25 DIAGNOSIS — R262 Difficulty in walking, not elsewhere classified: Secondary | ICD-10-CM

## 2020-10-25 DIAGNOSIS — R41841 Cognitive communication deficit: Secondary | ICD-10-CM | POA: Diagnosis present

## 2020-10-25 DIAGNOSIS — R2681 Unsteadiness on feet: Secondary | ICD-10-CM | POA: Insufficient documentation

## 2020-10-25 NOTE — Therapy (Signed)
[place] [facility] MAIN [facility] SERVICES [address] [place], [place], [address] Phone: [phone]   Fax:  [phone]  Speech Language Pathology Evaluation  Patient Details  Name: Jonathan Newman MRN: [id number] Date of Birth: 1960-11-01 Referring Provider (SLP): Dr. Cristopher Peru   Encounter Date: 10/25/2020   End of Session - 10/25/20 1532     Visit Number 1    Number of Visits 15    Date for SLP Re-Evaluation 01/23/21    Authorization Type Medicaid - 27 visits combined for SLP/PT    Authorization - Visit Number 0    Authorization - Number of Visits 15    Progress Note Due on Visit 10    SLP Start Time 1100    SLP Stop Time  1200    SLP Time Calculation (min) 60 min    Activity Tolerance Patient tolerated treatment well             Past Medical History:  Diagnosis Date   Constipation    CVA (cerebral vascular accident) (HCC)    Diarrhea    Dysarthria and anarthria    Hemiplegia and hemiparesis following cerebral infarction affecting right dominant side (HCC)    Hyperlipidemia    Pain in right leg     History reviewed. No pertinent surgical history.  There were no vitals filed for this visit.   Subjective Assessment - 10/25/20 1713     Subjective Pt takes out phone to show a picture of his dog    Patient is accompained by: Family member   sister [name]   Currently in Pain? No/denies                SLP Evaluation [facility] - 10/25/20 1515       SLP Visit Information   SLP Received On 10/25/20    Referring Provider (SLP) Dr. Cristopher Peru    Onset Date --   Feb 2021, approximate onset   Medical Diagnosis CVA      Subjective   Subjective moving wheelchair from lobby, anxious to get to ST    Patient/Family Stated Goal reduce frustration, improve communication      General Information   HPI Jonathan Newman is a 60 y.o. male referred by Dr. Sherryll Burger for SLP evaluation. Hx noted for DVT, HTN Pt with reported CVA in February 2021  (records from OSH are unavailable), with subsequent rehabilitation in [place]. Pt now living with his sister in [place]. Repeat MRI brain on 09/29/20 showed "Large chronic left MCA territory and left MCA/PCA watershed territory cortical/subcortical infarct, as described. Superimposed chronic perforator infarct within the left centrum semiovale/corona radiata and left basal ganglia. Associated wallerian degeneration on the left." Chronic infarcts also within left thalamus and bilateral pons, and mild generalized cerebral and cerebellar atrophy.    Behavioral/Cognition alert, engaged, motivated to communicate, impulsive    Mobility Status uses a wheelchair      Balance Screen   Has the patient fallen in the past 6 months --   PT eval today     Prior Functional Status   Cognitive/Linguistic Baseline Within functional limits     Lives With Other (Comment)   sister   Available Support Family;Available PRN/intermittently    Vocation Other (Comment)   worked sanding floors until CVA     Cognition   Overall Cognitive Status Difficult to assess    Difficult to assess due to Impaired communication   Expressive and receptive deficits; globally aphasic   Current Attention  Level Sustained    Behaviors Impulsive;Restless      Auditory Comprehension   Overall Auditory Comprehension Impaired    Yes/No Questions Impaired    Basic Biographical Questions 51-75% accurate    Basic Immediate Environment Questions 50-74% accurate    Complex Questions 75-100% accurate   ?chance vs comprehension   Commands Impaired    One Step Basic Commands 0-24% accurate   "close your eyes" accurate, only.   Two Step Basic Commands 0-24% accurate   0%   Conversation Other (comment)    Other Conversation Comments can comprehend portions of conversations in context with visual/written/gestural cues    EffectiveTechniques Pausing;Repetition;Stressing words;Visual/Gestural cues      Visual Recognition/Discrimination    Discrimination Exceptions to [facility]    Common Objects --   5/6   Black/White Line Drawings Unable to identify      Reading Comprehension   Reading Status Not tested   able to point to single words to answer biographical questions at times (son/daughter) (June/July)     Expression   Primary Mode of Expression --   uses grunting, stereotypic utterance "wang" "eh" and intonation     Verbal Expression   Overall Verbal Expression Impaired    Initiation Impaired    Automatic Speech --   none   Level of Generative/Spontaneous Verbalization --   syllable   Repetition Impaired    Level of Impairment Word level    Naming Impairment    Responsive 0-25% accurate    Confrontation 0-24% accurate    Convergent 0-24% accurate    Divergent 0-24% accurate    Verbal Errors Aware of errors    Pragmatics No impairment    Effective Techniques Written cues      Written Expression   Dominant Hand Right   prior to CVA; now uses L to write name   Written Expression Exceptions to [facility]    Self Formulation Ability --   writes name; unable to write numbers or write to dictation; per sister can copy/trace letters     Oral Motor/Sensory Function   Overall Oral Motor/Sensory Function Impaired    Labial ROM Reduced right    Labial Symmetry Abnormal symmetry right    Labial Strength Reduced Right    Lingual ROM Within Functional Limits    Lingual Symmetry Within Functional Limits    Lingual Coordination --   difficult to assess due to severe apraxia   Facial ROM Reduced right    Facial Symmetry Right droop    Velum Within Functional Limits      Motor Speech   Overall Motor Speech Impaired    Respiration Within functional limits    Phonation Low vocal intensity   Strained/strangled vocal quality   Motor Planning Impaired    Level of Impairment Word    Motor Speech Errors Groping for words      Standardized Assessments   Standardized Assessments  Western Aphasia Battery revised    Western Aphasia Battery  revised  see below for results               Western Aphasia Battery- Revised  Spontaneous Speech                           Information content               0/10  Fluency                                 0/10                                          Comprehension     Yes/No questions                 45/60                                           Auditory Word Recognition  10/60                                Sequential Commands       2/80                              Repetition                             0/100                                        Naming    Object Naming                     DNT (presumed 0)/60                                           Word Fluency                        DNT (presumed 0)/20                                            Sentence Completion          DNT (presumed 0)/10                                             Responsive Speech              DNT (presumed 0)/10                                         Aphasia Quotient                  5.7/100         Pt's severity rating is very severe as indicated by an Aphasia Quotient of 5.7 (0-25=very severe, 26-50=severe, 51-75=moderate, 76 and above is mild). Pt's presentation is most consistent with global  subtype, characterized by severely impaired verbal expression, severely impaired auditory comprehension, and severely impaired repetition. Naming was not formally administered but scored as 0 due to pt's inability to produce speech aside from non-word utterances. Reading and writing were not formally assessed; pt is able to write his first and last name, and comprehends some written text at word level. Pt's verbal expression is characterized by short, repetitive, non-word utterances devoid of language content. Oral motor examination reveals significant non-verbal oral apraxia.        SLP Education - 10/25/20 1530     Education Details pt has knowledge of concepts.  receptive and expressive language abilities limit his ability to understand what others are referring to, and from expressing what he is thinking    Person(s) Educated Patient;Other (comment)   sister   Methods Explanation    Comprehension Verbalized understanding              SLP Short Term Goals - 10/25/20 1552       SLP SHORT TERM GOAL #1   Title Patient will comprehend simple Y/N questions >80% accuracy with written cues/ AAC support.    Baseline 60%    Time 5    Period Weeks    Status New      SLP SHORT TERM GOAL #2   Title Patient will indicate when he does not comprehend in >80% of trials (word to sentence level), using facial expression, gesture, or AAC.    Baseline 0%    Time 5    Period Weeks    Status New      SLP SHORT TERM GOAL #3   Title Patient will answer simple biographical questions 80% accuracy (Y/N questions or AAC).    Baseline <50%    Time 5    Period Weeks    Status New              SLP Long Term Goals - 10/25/20 1556       SLP LONG TERM GOAL #1   Title Pt will communicate emergency information 100% accuracy, using AAC if necessary.    Baseline 0%    Time 12    Period Weeks    Status New    Target Date 01/23/21      SLP LONG TERM GOAL #2   Title Patient will reduce frustration by communicating thoughts/feelings with AAC in 80% of opportunities.    Baseline can only use facial expressions    Time 12    Period Weeks    Status New    Target Date 01/23/21      SLP LONG TERM GOAL #3   Title Pt will successfully convey requests for outings/ personal needs outside of ST >75% of the time, using AAC if necessary.    Baseline 0%    Time 12    Period Weeks    Status New    Target Date 01/23/21      SLP LONG TERM GOAL #4   Title Pt will reduce social isolation risk by participating in simple conversational exchange (3-5 minutes), using AAC.    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 Patient does appear  to have some ability to comprehend pieces of information in context, however he often nods his head and makes verbal responses to indicate comprehension when he does not understand. Non-verbal oral apraxia appears severe; pt had difficulty imitating oral motor movements. Administered Western Aphasia Battery-revised (receptive portion only, due to pt verbalizations limited to grunts and stereotypic utterances (eh, wang, mang). Y/N questions are impaired (45/60) as is auditory word comprehension at word level (10/60), sequential commands (2/80). Patient lives with his sister, who reports pt has a significant amount of frustration about his communication difficulties. He often gets emotional and angry. Patient has free Lingraph[employer] app on his cell phone ([employer]), and was able to use this to show me his dog and family members. Sister reports they have been attempting to get pt a Lingraphica communication device, but have not done this yet. Pt was eager and enthusiastic when SLP showed him Lingraph[employer] device; he later pointed to this during the evaluation to request it. Pt interacted with icons on the device and indicated preferred food and restaurants. Education provided to pt and sister re: prognosis for language recovery (more than 1.5 years post CVA) and recommendation that ST sessions focus on improving functional communication, trialing use of AAC to determine best way to support pt in communicating/comprehending.    Speech Therapy Frequency 2x / week    Duration 12 weeks   ST limited to 14 visits this year due to Medicaid visit limit (27 combined)   Treatment/Interventions Language facilitation;Environmental controls;Cueing hierarchy;SLP instruction and feedback;Compensatory techniques;Cognitive  reorganization;Functional tasks;Compensatory strategies;Patient/family education;Internal/external aids;Multimodal communcation approach    Potential to Achieve Goals Good   with AAC   Potential Considerations Severity of impairments;Financial resources   has good family support, appears highly motivated   Financial planner with Plan of Care Patient             Patient will benefit from skilled therapeutic intervention in order to improve the following deficits and impairments:   Aphasia - Plan: SLP plan of care cert/re-cert, CANCELED: SLP plan of care cert/re-cert  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 cert/re-cert, CANCELED: SLP plan of care cert/re-cert  Cognitive communication deficit - Plan: SLP plan of care cert/re-cert, CANCELED: SLP plan of care cert/re-cert    Problem List Patient Active Problem List   Diagnosis Date Noted   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 [place], [place], [address] Phone: [phone]   Fax:  [phone]  Name: Jonathan Newman MRN: [id number] Date of Birth: Aug 03, 1960

## 2020-10-25 NOTE — Therapy (Signed)
[place] [facility] MAIN [facility] SERVICES [address] [place], [place], [address] Phone: [phone]   Fax:  [phone]  Physical Therapy Evaluation  Patient Details  Name: Jonathan Newman MRN: [id number] Date of Birth: 28-Dec-1960 No data recorded  Encounter Date: 10/25/2020   PT End of Session - 10/25/20 2300     Visit Number 1    Number of Visits 25    Date for PT Re-Evaluation 01/17/21    Authorization Type Medicaid=Authorized combined PT/OT/SLP 27 visits    Progress Note Due on Visit 10    PT Start Time 1000    PT Stop Time 1058    PT Time Calculation (min) 58 min    Equipment Utilized During Treatment Gait belt    Activity Tolerance Patient tolerated treatment well    Behavior During Therapy Restless;Anxious;Impulsive             Past Medical History:  Diagnosis Date   Constipation    CVA (cerebral vascular accident) (HCC)    Diarrhea    Dysarthria and anarthria    Hemiplegia and hemiparesis following cerebral infarction affecting right dominant side (HCC)    Hyperlipidemia    Pain in right leg     History reviewed. No pertinent surgical history.  Vitals:   10/25/20 1014  BP: (!) 155/91  Pulse: (!) 50  Resp: 17  SpO2: 97%  Weight: 222 lb (100.7 kg)  Height: 5\' 8"  (1.727 m)      Subjective Assessment - 10/25/20 1017     Subjective Patient mostly non-verbal due to global aphasia - does try to respond with 1 word answers but severy difficulty with language/conversing. Sister- 12/25/20 is present and able to provide most history.    Patient is accompained by: Family member   Sister Jonathan Newman   Pertinent History Patient is a 60 year old male with recent history of L MCA stroke in Feb 2021.Per Dr. Mar 2021 note on 09/18/2020-  Likely left Middle Cerebral Artery ischemic infract causing global aphasia affecting motor more than sensory and right flaccid hemiparesis and right hemisensory loss in a patient with hypertension, smoking, etc. Had  developed DVT afterwards. Patient is present with sister, 09/20/2020 today who reports he has been living with her since May 2022 and uses his electric w/c in the home an has not walked since CVA- has manual w/c that he uses for community outings. She reports he requires assist with all ADLs and is impulsive but does require assist with transfers for safety- but states he does go to bathroom on his own at times.    Limitations Lifting;Standing;Walking;Writing;Reading;House hold activities    How long can you sit comfortably? no issues or limits    How long can you stand comfortably? < June 2022    How long can you walk comfortably? Has not walked since CVA    Patient Stated Goals Caregiver goal- to be as independent and do as much as he can on his own.    Currently in Pain? No/denies                 OBJECTIVE  MUSCULOSKELETAL: Tremor: Absent Bulk: Normal Tone: Normal, no clonus  Posture No gross abnormalities noted in seated posture  Gait Patient unable- Mod assist to stand- Patient presents with decreased ability to weight bear through Right LE.  Strength R/L 2-/5 Hip flexion 2-/5 Hip external rotation 2-/5 Hip internal rotation 2_/5 Hip extension  2-/5 Hip abduction 2-5 Hip adduction 2-/5 Knee extension  2-/5 Knee flexion 0/5 Ankle Plantarflexion 0/5 Ankle Dorsiflexion  *Patient presents with flaccid Right UE.   Transfers:   Patient able to stand with moderate assist and transfer from bed to w/c yet minimal use of Right LE   NEUROLOGICAL:     Sensation Grossly intact to light touch bilateral UEs/LEs as determined by testing dermatomes C2-T2/L2-S2 respectively Proprioception and hot/cold testing deferred on this date    FUNCTIONAL OUTCOME MEASURES   Results Comments  FOTO                                  29   ASSESSMENT Clinical Impression: Pt is a pleasant 60 year-old male referred with diagnosis of CVA and hemiplegia. PT examination reveals deficits . Pt  presents with deficits including Ride sided UE/LE weakness, Difficulty with functional mobility including requring assistance with transfers and inability to currently walk with imbalance and increased risk of falling. Patient is fair potential due to time since CVA in Feb 2021. He also presents with global aphasia and impulsive nature which are identified as barriers. Pt will benefit from skilled PT services to address deficits in balance and decrease risk for future falls.      [facility] PT Assessment - 10/25/20 1023       Assessment   Hand Dominance Right   now using left hand since CVA   Next MD Visit 10/26/2020   Urology   Prior Therapy yes   >1 year ago     Precautions   Precautions Fall      Restrictions   Weight Bearing Restrictions No      Balance Screen   Has the patient fallen in the past 6 months No    Has the patient had a decrease in activity level because of a fear of falling?  Yes    Is the patient reluctant to leave their home because of a fear of falling?  No      Home Environment   Living Environment Private residence    Living Arrangements Other relatives   Sister- Jonathan Newman   Available Help at Discharge Family;Personal care attendant   9-5 HHA   Type of Home House    Home Access Ramped entrance   front   Home Layout One level    Home Equipment Bedside commode;Shower seat;Wheelchair - Engineer, technical sales - power;Hospital bed      Prior Function   Level of Independence Independent    Vocation On disability      Cognition   Overall Cognitive Status Impaired/Different from baseline    Area of Impairment Orientation;Attention;Memory;Following commands;Safety/judgement;Awareness;Problem solving    Difficult to assess due to Impaired communication    Orientation Level Disoriented to;Time;Situation;Place    Current Attention Level Sustained    Behaviors Restless;Impulsive      Observation/Other Assessments   Focus on Therapeutic Outcomes (FOTO)  29                           Objective measurements completed on examination: See above findings.                PT Education - 10/25/20 2256     Education Details PT Plan of care    Person(s) Educated Patient;Caregiver(s)   Sister- Jonathan Newman   Methods Explanation;Verbal cues    Comprehension Verbalized understanding  PT Short Term Goals - 10/25/20 2336       PT SHORT TERM GOAL #1   Title Pt will be independent with HEP in order to improve strength and balance in order to decrease fall risk and improve function at home and work.    Baseline 10/25/2020= Patient has no formal HEP in place    Time 6    Period Weeks    Target Date 12/06/20      PT SHORT TERM GOAL #2   Title Patient will perform all sit to stand transfers with CGA for improved functional mobility and decreased dependence on caregivers.    Baseline 10/25/2020=Patient requires mod assist with all sit to stand transfers    Time 6    Period Weeks    Status New    Target Date 12/06/20               PT Long Term Goals - 10/25/20 2347       PT LONG TERM GOAL #1   Title Pt will improve FOTO to target score of 53  to display perceived improvements in ability to complete ADL's.    Baseline 10/25/2020= 29    Time 12    Period Weeks    Status New    Target Date 01/17/21      PT LONG TERM GOAL #2   Title Patient will perform stand pivot transfer with supervision from all surfaces without loss of balance or VC for safety.    Baseline 10/25/2020- Patient requires Mod assist for safe transfers with min ability to place any weight through right LE.    Time 12    Period Weeks    Status New    Target Date 01/17/21      PT LONG TERM GOAL #3   Title Patient will perform static stand with least restrictive assistive device > 2 min with > 25% weight bearing through right LE without LOB.    Baseline 10/25/2020- Patient able to stand approx 30 sec yet did not place any weight through Right LE.    Time  12    Period Weeks    Status New    Target Date 01/17/21                    Plan - 10/26/20 0905     Clinical Impression Statement Pt is a pleasant 60 year-old male referred with diagnosis of CVA and hemiplegia. PT examination reveals deficits . Pt presents with deficits including Ride sided UE/LE weakness, Difficulty with functional mobility including requring assistance with transfers and inability to currently walk with imbalance and increased risk of falling. Patient is fair potential due to time since CVA in Feb 2021. He also presents with global aphasia and impulsive nature which are identified as barriers. Pt will benefit from skilled PT services to address deficits in balance and decrease risk for future falls.    Personal Factors and Comorbidities Comorbidity 3+;Time since onset of injury/illness/exacerbation    Comorbidities CVA, Aphasia, Hyperlipidemia    Examination-Activity Limitations Bathing;Bed Mobility;Bend;Caring for Others;Carry;Lift;Reach Overhead;Squat;Stairs;Stand;Toileting;Transfers    Examination-Participation Restrictions Cleaning;Community Activity;Driving;Laundry;Medication Management;Meal Prep;Occupation;Personal Finances;Shop;Yard Work    Research officer, trade union Potential Fair    PT Frequency 1x / week    PT Duration 12 weeks    PT Treatment/Interventions ADLs/Self Care Home Management;Aquatic Therapy;Cryotherapy;Electrical Stimulation;Moist Heat;Ultrasound;DME Instruction;Gait training;Stair training;Functional mobility training;Therapeutic activities;Therapeutic exercise;Balance training;Neuromuscular re-education;Patient/family education;Wheelchair mobility training;Manual techniques;Passive range of motion;Dry needling;Splinting;Orthotic Fit/Training;Taping  PT Next Visit Plan Instruct in LE Strengthening for Home program    PT Home Exercise Plan To be initiated next visit    Consulted and Agree with Plan of Care Patient;Family  member/caregiver    Family Member Consulted Sister- Jonathan Newman             Patient will benefit from skilled therapeutic intervention in order to improve the following deficits and impairments:  Abnormal gait, Decreased activity tolerance, Decreased balance, Decreased cognition, Decreased coordination, Decreased endurance, Decreased knowledge of use of DME, Decreased mobility, Decreased range of motion, Decreased safety awareness, Decreased strength, Difficulty walking, Hypomobility, Impaired perceived functional ability, Impaired flexibility, Impaired sensation, Impaired UE functional use  Visit Diagnosis: Difficulty in walking, not elsewhere classified  Muscle weakness (generalized)  Unsteadiness on feet     Problem List Patient Active Problem List   Diagnosis Date Noted   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

## 2020-10-26 ENCOUNTER — Ambulatory Visit: Payer: Medicaid Other | Admitting: Urology

## 2020-10-30 ENCOUNTER — Other Ambulatory Visit: Payer: Self-pay

## 2020-10-30 ENCOUNTER — Ambulatory Visit: Payer: Medicaid Other | Admitting: Physical Therapy

## 2020-10-30 ENCOUNTER — Ambulatory Visit: Payer: Medicaid Other | Admitting: Speech Pathology

## 2020-10-30 DIAGNOSIS — M6281 Muscle weakness (generalized): Secondary | ICD-10-CM

## 2020-10-30 DIAGNOSIS — R482 Apraxia: Secondary | ICD-10-CM

## 2020-10-30 DIAGNOSIS — R41841 Cognitive communication deficit: Secondary | ICD-10-CM

## 2020-10-30 DIAGNOSIS — R2681 Unsteadiness on feet: Secondary | ICD-10-CM

## 2020-10-30 DIAGNOSIS — R4701 Aphasia: Secondary | ICD-10-CM

## 2020-10-30 DIAGNOSIS — R262 Difficulty in walking, not elsewhere classified: Secondary | ICD-10-CM

## 2020-10-30 NOTE — Patient Instructions (Signed)
Access Code: [email or web address] URL: [email or web address] Date: 10/30/2020 Prepared by: Thresa Ross  Exercises Seated Long Arc Quad - 1 x daily - 7 x weekly - 2 sets - 10 reps Seated March - 1 x daily - 7 x weekly - 2 sets - 10 reps Seated Heel Raise - 1 x daily - 7 x weekly - 2 sets - 10 reps Seated Hip Adduction Isometrics with Ball - 1 x daily - 7 x weekly - 2 sets - 10 reps Seated Hip Abduction with Resistance - 1 x daily - 7 x weekly - 2 sets - 10 reps

## 2020-10-30 NOTE — Therapy (Signed)
[place] [facility] MAIN [facility] SERVICES [address] [place], [place], [address] Phone: [phone]   Fax:  [phone]  Physical Therapy Treatment  Patient Details  Name: Jonathan Newman MRN: [id number] Date of Birth: 23-Jul-1960 No data recorded  Encounter Date: 10/30/2020   PT End of Session - 10/30/20 1715     Visit Number 2    Number of Visits 25    Date for PT Re-Evaluation 01/17/21    Authorization Type Medicaid=Authorized combined PT/OT/SLP 27 visits    Authorization - Visit Number 1    Authorization - Number of Visits 3    Progress Note Due on Visit 10    Equipment Utilized During Treatment Gait belt    Activity Tolerance Patient tolerated treatment well    Behavior During Therapy Restless;Anxious;Impulsive             Past Medical History:  Diagnosis Date   Constipation    CVA (cerebral vascular accident) (HCC)    Diarrhea    Dysarthria and anarthria    Hemiplegia and hemiparesis following cerebral infarction affecting right dominant side (HCC)    Hyperlipidemia    Pain in right leg    No past surgical history on file.  There were no vitals filed for this visit. All the following exercises were completed in today's physical therapy session and abilities therapeutic exercise as they were all intended to improve patient's lower extremity strength and function. Exercises  Seated Long Arc Quad - 1 x daily - 7 x weekly - 2 sets - 10 reps -Able to perform well utilizing left lower extremity when performing on right lower extremity patient unable to activate quad muscle and compensates utilizing hip flexion.  Several tactile and verbal cues were attempted but unsuccessful then initiating any quad activation with this activity.  In order to perform activity physical therapist passively extended right knee and had try to maintain knee extension and slow eccentric motion but upon palpation physical therapist did not feel it quad muscle  activation with this activity. Seated March - 1 x daily - 7 x weekly - 2 sets - 10 reps -Unable to perform well reciprocally patient attempts to perform long arc quad on left lower extremity.  Could be in part due to patient's confusion with exercise it was completed prior. Seated Heel Raise - 1 x daily - 7 x weekly - 2 sets - 10 reps -To perform on left lower extremity patient required mod to max verbal and tactile cues for proper muscle activation.  Patient unable to actively plantarflex on his right lower extremity Seated Hip Adduction Isometrics with Ball - 1 x daily - 7 x weekly - 2 sets - 10 reps -Patient unable to activate right lower extremity abductors completion of his exercise and reported his right lower extremity was uncomfortable due to muscle activation.  Seated Hip Abduction with Resistance - 1 x daily - 7 x weekly - 2 sets - 10 reps -Patient unable to activate right lower extremity hip abductors during this exercise was able to perform left lower extremity hip abduction without significant difficulty.  Utilized yellow Thera-Band manual therapy and was provided for home exercises.   Soccer ball kicks 2 sets of 5 repetitions with instructions only utilize right lower extremity.  Patient unable to perform quad activation to perform his exercise but compensates with hip flexion as well as contralateral isometric hip extension for stability.  Patient does attempt to complete exercise properly but is unable to activate  his quad muscle.                             PT Education - 10/30/20 1715     Education Details Access Code: [email or web address]  URL: [email or web address]    Person(s) Educated Patient;Caregiver(s)    Methods Explanation;Demonstration;Verbal cues;Handout;Tactile cues    Comprehension Returned demonstration;Verbal cues required;Tactile cues required;Need further instruction              PT Short Term Goals - 10/25/20 2336       PT  SHORT TERM GOAL #1   Title Pt will be independent with HEP in order to improve strength and balance in order to decrease fall risk and improve function at home and work.    Baseline 10/25/2020= Patient has no formal HEP in place    Time 6    Period Weeks    Target Date 12/06/20      PT SHORT TERM GOAL #2   Title Patient will perform all sit to stand transfers with CGA for improved functional mobility and decreased dependence on caregivers.    Baseline 10/25/2020=Patient requires mod assist with all sit to stand transfers    Time 6    Period Weeks    Status New    Target Date 12/06/20               PT Long Term Goals - 10/25/20 2347       PT LONG TERM GOAL #1   Title Pt will improve FOTO to target score of 53  to display perceived improvements in ability to complete ADL's.    Baseline 10/25/2020= 29    Time 12    Period Weeks    Status New    Target Date 01/17/21      PT LONG TERM GOAL #2   Title Patient will perform stand pivot transfer with supervision from all surfaces without loss of balance or VC for safety.    Baseline 10/25/2020- Patient requires Mod assist for safe transfers with min ability to place any weight through right LE.    Time 12    Period Weeks    Status New    Target Date 01/17/21      PT LONG TERM GOAL #3   Title Patient will perform static stand with least restrictive assistive device > 2 min with > 25% weight bearing through right LE without LOB.    Baseline 10/25/2020- Patient able to stand approx 30 sec yet did not place any weight through Right LE.    Time 12    Period Weeks    Status New    Target Date 01/17/21                   Plan - 10/30/20 1716     Clinical Impression Statement Patient demonstrated fatigue with several exercises performed in physical therapy session today.  Patient also demonstrates continued impulsivity as evidenced by performing transfers prior to therapy signaling he is ready also performing sit to stand while  seated in wheelchair without therapist instruction.  Patient demonstrates fair tolerance to therapeutic exercise when performing such as on his left lower extremity patient unable to perform isolated lower extremity movements on his right lower extremity and is only able to perform minimal hip flexion in order to compensate with all other movements he attempts.  Patient was given home exercise program and all exercises were reviewed and performed in therapy  session.  Patient and caregiver were instructed to complete exercises daily in order to improve his lower extremity strength and function.  Patient will continue to benefit from skilled physical therapy services in order to improve his balance, decreased risk of falls, and improve overall functional capacity.    Personal Factors and Comorbidities Comorbidity 3+;Time since onset of injury/illness/exacerbation    Comorbidities CVA, Aphasia, Hyperlipidemia    Examination-Activity Limitations Bathing;Bed Mobility;Bend;Caring for Others;Carry;Lift;Reach Overhead;Squat;Stairs;Stand;Toileting;Transfers    Examination-Participation Restrictions Cleaning;Community Activity;Driving;Laundry;Medication Management;Meal Prep;Occupation;Personal Finances;Shop;Yard Work    Rehab Potential Fair    PT Frequency 1x / week    PT Duration 12 weeks    PT Treatment/Interventions ADLs/Self Care Home Management;Aquatic Therapy;Cryotherapy;Electrical Stimulation;Moist Heat;Ultrasound;DME Instruction;Gait training;Stair training;Functional mobility training;Therapeutic activities;Therapeutic exercise;Balance training;Neuromuscular re-education;Patient/family education;Wheelchair mobility training;Manual techniques;Passive range of motion;Dry needling;Splinting;Orthotic Fit/Training;Taping    PT Next Visit Plan Instruct in LE Strengthening for Home program    PT Home Exercise Plan To be initiated next visit    Consulted and Agree with Plan of Care Patient;Family member/caregiver     Family Member Consulted Sister- [name]             Patient will benefit from skilled therapeutic intervention in order to improve the following deficits and impairments:  Abnormal gait, Decreased activity tolerance, Decreased balance, Decreased cognition, Decreased coordination, Decreased endurance, Decreased knowledge of use of DME, Decreased mobility, Decreased range of motion, Decreased safety awareness, Decreased strength, Difficulty walking, Hypomobility, Impaired perceived functional ability, Impaired flexibility, Impaired sensation, Impaired UE functional use  Visit Diagnosis: Difficulty in walking, not elsewhere classified  Muscle weakness (generalized)  Unsteadiness on feet     Problem List Patient Active Problem List   Diagnosis Date Noted   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

## 2020-10-31 NOTE — Therapy (Signed)
[place] [facility] MAIN [facility] SERVICES [address] [place], [place], [address] Phone: [phone]   Fax:  [phone]  Speech Language Pathology Treatment  Patient Details  Name: Jonathan Newman MRN: [id number] Date of Birth: Nov 24, 1960 Referring Provider (SLP): Dr. Cristopher Peru   Encounter Date: 10/30/2020   End of Session - 10/31/20 0844     Visit Number 2    Number of Visits 15    Date for SLP Re-Evaluation 01/23/21    Authorization Type Medicaid - 27 visits combined for SLP/PT    Authorization - Visit Number 1    Authorization - Number of Visits 15    Progress Note Due on Visit 10    SLP Start Time 1500    SLP Stop Time  1600    SLP Time Calculation (min) 60 min    Activity Tolerance Patient tolerated treatment well             Past Medical History:  Diagnosis Date   Constipation    CVA (cerebral vascular accident) (HCC)    Diarrhea    Dysarthria and anarthria    Hemiplegia and hemiparesis following cerebral infarction affecting right dominant side (HCC)    Hyperlipidemia    Pain in right leg     No past surgical history on file.  There were no vitals filed for this visit.   Subjective Assessment - 10/31/20 0831     Subjective Pt reaches for Lingraphica on the desk and grunts    Currently in Pain? No/denies                   ADULT SLP TREATMENT - 10/31/20 0832       General Information   Behavior/Cognition Alert;Cooperative;Decreased sustained attention    HPI Jonathan Newman is a 60 y.o. male referred by Dr. Sherryll Burger for SLP evaluation. Hx noted for DVT, HTN Pt with reported CVA in February 2021 (records from OSH are unavailable), with subsequent rehabilitation in [place]. Pt now living with his sister in [place]. Repeat MRI brain on 09/29/20 showed "Large chronic left MCA territory and left MCA/PCA watershed territory cortical/subcortical infarct, as described. Superimposed chronic perforator infarct within the left  centrum semiovale/corona radiata and left basal ganglia. Associated wallerian degeneration on the left." Chronic infarcts also within left thalamus and bilateral pons, and mild generalized cerebral and cerebellar atrophy.      Treatment Provided   Treatment provided Cognitive-Linquistic      Pain Assessment   Pain Assessment No/denies pain      Cognitive-Linquistic Treatment   Treatment focused on Aphasia    Skilled Treatment Patient reached independently for Lingraphica tablet and opened "Me" section. Pointed to SLP model "about me". Typed "[id number]" Jonathan Newman), and nodded Y when asked if he would like to make some icons about himself. Pt assisted with creating icons to introduce himself (usual mod-max visual cues to access menu and take photos). Pt showed photos on his phone of additional topics he wanted to add (dog, family). At end of session, pt used icons to introduce himself to PT and communicate about pain with occasional mod cues.      Assessment / Recommendations / Plan   Plan Continue with current plan of care      Progression Toward Goals   Progression toward goals Progressing toward goals              SLP Education - 10/31/20 0843     Education Details aac device  Person(s) Educated Patient    Methods Explanation    Comprehension Verbalized understanding              SLP Short Term Goals - 10/25/20 1552       SLP SHORT TERM GOAL #1   Title Patient will comprehend simple Y/N questions >80% accuracy with written cues/ AAC support.    Baseline 60%    Time 5    Period Weeks    Status New      SLP SHORT TERM GOAL #2   Title Patient will indicate when he does not comprehend in >80% of trials (word to sentence level), using facial expression, gesture, or AAC.    Baseline 0%    Time 5    Period Weeks    Status New      SLP SHORT TERM GOAL #3   Title Patient will answer simple biographical questions 80% accuracy (Y/N questions or AAC).    Baseline <50%     Time 5    Period Weeks    Status New              SLP Long Term Goals - 10/25/20 1556       SLP LONG TERM GOAL #1   Title Pt will communicate emergency information 100% accuracy, using AAC if necessary.    Baseline 0%    Time 12    Period Weeks    Status New    Target Date 01/23/21      SLP LONG TERM GOAL #2   Title Patient will reduce frustration by communicating thoughts/feelings with AAC in 80% of opportunities.    Baseline can only use facial expressions    Time 12    Period Weeks    Status New    Target Date 01/23/21      SLP LONG TERM GOAL #3   Title Pt will successfully convey requests for outings/ personal needs outside of ST >75% of the time, using AAC if necessary.    Baseline 0%    Time 12    Period Weeks    Status New    Target Date 01/23/21      SLP LONG TERM GOAL #4   Title Pt will reduce social isolation risk by participating in simple conversational exchange (3-5 minutes), using AAC.    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 Patient does appear to have some ability to comprehend pieces of information in context, however he often nods his head and makes verbal responses to indicate comprehension when he does not understand. Very eager and initiating communication attempts with Lingraphica tablet. Pt required cues to maintain sustained attention; suspect impulsivity, distractibility is due in some part to pt's strong desire to communicate about many subjects. I recommend skilled ST with focus on improving functional communication, trialing use of AAC to determine best way to support pt in communicating/comprehending.    Speech Therapy Frequency 2x / week    Duration 12 weeks   ST limited  to 14 visits this year due to Medicaid visit limit (27 combined)   Treatment/Interventions Language facilitation;Environmental controls;Cueing hierarchy;SLP instruction and feedback;Compensatory techniques;Cognitive reorganization;Functional  tasks;Compensatory strategies;Patient/family education;Internal/external aids;Multimodal communcation approach    Potential to Achieve Goals Good   with AAC   Potential Considerations Severity of impairments;Financial resources   has good family support, appears highly motivated   Consulted and Agree with Plan of Care Patient             Patient will benefit from skilled therapeutic intervention in order to improve the following deficits and impairments:   Aphasia  Verbal apraxia  Cognitive communication deficit    Problem List Patient Active Problem List   Diagnosis Date Noted   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 [place], [place], [address] Phone: [phone]   Fax:  [phone]   Name: Jonathan Newman MRN: [id number] Date of Birth: [birth year]

## 2020-11-01 ENCOUNTER — Encounter: Payer: Medicaid Other | Admitting: Speech Pathology

## 2020-11-01 ENCOUNTER — Ambulatory Visit: Payer: Medicaid Other | Admitting: Speech Pathology

## 2020-11-01 ENCOUNTER — Ambulatory Visit: Payer: Medicaid Other | Admitting: Physical Therapy

## 2020-11-01 ENCOUNTER — Other Ambulatory Visit: Payer: Self-pay

## 2020-11-01 ENCOUNTER — Ambulatory Visit: Payer: Medicaid Other

## 2020-11-01 DIAGNOSIS — R482 Apraxia: Secondary | ICD-10-CM

## 2020-11-01 DIAGNOSIS — R41841 Cognitive communication deficit: Secondary | ICD-10-CM

## 2020-11-01 DIAGNOSIS — R4701 Aphasia: Secondary | ICD-10-CM | POA: Diagnosis not present

## 2020-11-01 NOTE — Therapy (Signed)
[facility] MAIN [facility] SERVICES [address] [place], [place], [address] Phone: [phone]   Fax:  [phone]  Speech Language Pathology Treatment  Patient Details  Name: Jonathan Newman MRN: [id number] Date of Birth: [birth year] Referring Provider (SLP): Dr. Cristopher Peru   Encounter Date: 11/01/2020   End of Session - 11/01/20 1750     Visit Number 3    Number of Visits 15    Date for SLP Re-Evaluation 01/23/21    Authorization Type Medicaid - 27 visits combined for SLP/PT    Authorization - Visit Number 2    Authorization - Number of Visits 15    Progress Note Due on Visit 10    SLP Start Time 1510   10 min late   SLP Stop Time  1600    SLP Time Calculation (min) 50 min             Past Medical History:  Diagnosis Date   Constipation    CVA (cerebral vascular accident) (HCC)    Diarrhea    Dysarthria and anarthria    Hemiplegia and hemiparesis following cerebral infarction affecting right dominant side (HCC)    Hyperlipidemia    Pain in right leg     No past surgical history on file.  There were no vitals filed for this visit.   Subjective Assessment - 11/01/20 1729     Subjective Pt looks around room for Lingraphica    Patient is accompained by: Family member   sister [name]   Currently in Pain? No/denies                   ADULT SLP TREATMENT - 11/01/20 1730       General Information   Behavior/Cognition Alert;Cooperative;Decreased sustained attention    HPI Jonathan Newman is a 60 y.o. male referred by Dr. Sherryll Burger for SLP evaluation. Hx noted for DVT, HTN Pt with reported CVA in February 2021 (records from OSH are unavailable), with subsequent rehabilitation in [place]. Pt now living with his sister in [place]. Repeat MRI brain on 09/29/20 showed "Large chronic left MCA territory and left MCA/PCA watershed territory cortical/subcortical infarct, as described. Superimposed chronic perforator infarct within the  left centrum semiovale/corona radiata and left basal ganglia. Associated wallerian degeneration on the left." Chronic infarcts also within left thalamus and bilateral pons, and mild generalized cerebral and cerebellar atrophy.      Cognitive-Linquistic Treatment   Treatment focused on Aphasia    Skilled Treatment Sister reports pt has had episodes of agitation, outbursts, yelling and pushing furniture. Has follow-up with Dr. Sherryll Burger tomorrow. Pt began showing sister some of the icons created on Lingraphica device. When sister contradicted/tried to correct some of this information, pt became verbally agitated and began yelling, attempting to roll his wheelchair out of the room. SLP asked pt if he would like a break and he nodded yes. Created "feelings" category on Lingraphica and customized some icons for pt to express himself such as "I am so frustrated," " I am angry," "I need a break," "Please stop talking." "I disagree" "I don't like that", as well as positive/affirmative feelings/opinions such as "I love you," "I'm sorry" and "I like that." Pt activated "Please stop talking" icon several times. Able to nod "no" when asked if he wanted SLP to stop talking and then pointed to sister. Pt also activated, "I'm frustrated" icon and "I love you," pointing to sister again. Pt assisted in choosing images for icons. When SLP  chose one pt did not like, he selected, "I don't like that," and was given opportunity to choose an alternative image. ~75% of pt's icon selections appeared appropriate/ attributable to communication efforts. Encouraged pt to use device to communicate when frustrated. Education provided to sister that correcting pt when he is upset likely to be agitating and frustrating for pt. Demonstrated how to affirm competence/validate what pt is saying even if she disagrees, and consider revisiting the topic when pt is calm and better able to understand. SLP asked pt if he would like to try the Lingraphica  device at home over the weekend; he activated, "I like that," to affirm.      Assessment / Recommendations / Plan   Plan Continue with current plan of care      Progression Toward Goals   Progression toward goals Progressing toward goals              SLP Education - 11/01/20 1749     Education Details validate/affirm pt's message, reveal competence    Person(s) Educated Patient   sister   Methods Explanation    Comprehension Verbalized understanding              SLP Short Term Goals - 10/25/20 1552       SLP SHORT TERM GOAL #1   Title Patient will comprehend simple Y/N questions >80% accuracy with written cues/ AAC support.    Baseline 60%    Time 5    Period Weeks    Status New      SLP SHORT TERM GOAL #2   Title Patient will indicate when he does not comprehend in >80% of trials (word to sentence level), using facial expression, gesture, or AAC.    Baseline 0%    Time 5    Period Weeks    Status New      SLP SHORT TERM GOAL #3   Title Patient will answer simple biographical questions 80% accuracy (Y/N questions or AAC).    Baseline <50%    Time 5    Period Weeks    Status New              SLP Long Term Goals - 10/25/20 1556       SLP LONG TERM GOAL #1   Title Pt will communicate emergency information 100% accuracy, using AAC if necessary.    Baseline 0%    Time 12    Period Weeks    Status New    Target Date 01/23/21      SLP LONG TERM GOAL #2   Title Patient will reduce frustration by communicating thoughts/feelings with AAC in 80% of opportunities.    Baseline can only use facial expressions    Time 12    Period Weeks    Status New    Target Date 01/23/21      SLP LONG TERM GOAL #3   Title Pt will successfully convey requests for outings/ personal needs outside of ST >75% of the time, using AAC if necessary.    Baseline 0%    Time 12    Period Weeks    Status New    Target Date 01/23/21      SLP LONG TERM GOAL #4   Title Pt will  reduce social isolation risk by participating in simple conversational exchange (3-5 minutes), using AAC.    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 Patient does appear to have some ability to comprehend pieces of information in context, however he often nods his head and makes verbal responses to indicate comprehension when he does not understand. Very eager and initiating communication attempts with Lingraphica tablet. Verbally agitated with sister in session today on 2 occasions; redirected to use Lingraphica to communicate feelings/requests and pt was calmer afterwards. Requesting device trial for Lingraphica TouchTalk; I recommend skilled ST with focus on improving functional communication, trialing use of AAC to determine best way to support pt in communicating/comprehending.    Speech Therapy Frequency 2x / week    Duration 12 weeks   ST limited to 14 visits this year due to Medicaid visit limit (27 combined)   Treatment/Interventions Language facilitation;Environmental controls;Cueing hierarchy;SLP instruction and feedback;Compensatory techniques;Cognitive reorganization;Functional tasks;Compensatory strategies;Patient/family education;Internal/external aids;Multimodal communcation approach    Potential to Achieve Goals Good   with AAC   Potential Considerations Severity of impairments;Financial resources   has good family support, appears highly motivated   Consulted and Agree with Plan of Care Patient             Patient will benefit from skilled therapeutic intervention in order to improve the following deficits and impairments:   Aphasia  Verbal apraxia  Cognitive communication deficit    Problem  List Patient Active Problem List   Diagnosis Date Noted   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 [place], [place], [address] Phone: [phone]   Fax:  [phone]   Name: Jonathan Newman MRN: [id number] Date of Birth: July 26, 1960

## 2020-11-02 DIAGNOSIS — Z95818 Presence of other cardiac implants and grafts: Secondary | ICD-10-CM | POA: Insufficient documentation

## 2020-11-06 ENCOUNTER — Ambulatory Visit: Payer: Medicaid Other | Admitting: Physical Therapy

## 2020-11-06 ENCOUNTER — Ambulatory Visit: Payer: Medicaid Other | Admitting: Speech Pathology

## 2020-11-06 ENCOUNTER — Other Ambulatory Visit: Payer: Self-pay

## 2020-11-06 DIAGNOSIS — R4701 Aphasia: Secondary | ICD-10-CM | POA: Diagnosis not present

## 2020-11-06 DIAGNOSIS — R482 Apraxia: Secondary | ICD-10-CM

## 2020-11-06 DIAGNOSIS — R2681 Unsteadiness on feet: Secondary | ICD-10-CM

## 2020-11-06 DIAGNOSIS — M6281 Muscle weakness (generalized): Secondary | ICD-10-CM

## 2020-11-06 DIAGNOSIS — R41841 Cognitive communication deficit: Secondary | ICD-10-CM

## 2020-11-06 DIAGNOSIS — R262 Difficulty in walking, not elsewhere classified: Secondary | ICD-10-CM

## 2020-11-06 NOTE — Therapy (Signed)
[facility] MAIN [facility] SERVICES [address] [place], [place], [address] Phone: [phone]   Fax:  [phone]  Physical Therapy Treatment  Patient Details  Name: Jonathan Newman MRN: [id number] Date of Birth: March 22, 1960 No data recorded  Encounter Date: 11/06/2020   PT End of Session - 11/06/20 1652     Visit Number 3    Number of Visits 25    Date for PT Re-Evaluation 01/17/21    Authorization Type Medicaid=Authorized combined PT/OT/SLP 27 visits    Authorization - Visit Number 1    Authorization - Number of Visits 3    Progress Note Due on Visit 10    PT Start Time 1600    PT Stop Time 1645    PT Time Calculation (min) 45 min    Equipment Utilized During Treatment Gait belt    Activity Tolerance Patient tolerated treatment well    Behavior During Therapy Restless;Anxious;Impulsive             Past Medical History:  Diagnosis Date   Constipation    CVA (cerebral vascular accident) (HCC)    Diarrhea    Dysarthria and anarthria    Hemiplegia and hemiparesis following cerebral infarction affecting right dominant side (HCC)    Hyperlipidemia    Pain in right leg     No past surgical history on file.  There were no vitals filed for this visit.   Subjective Assessment - 11/06/20 1616     Subjective Pt reports to PT following speech therapy, no changes since last session and nothing new to report per caregiver.    Pertinent History Patient is a 60 year old male with recent history of L MCA stroke in Feb 2021.Per Dr. 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 Had developed DVT afterwards. Patient is present with sister, [name] today who reports he has been living with her since May 2022 and uses his electric w/c in the home an has not walked  since CVA- has manual w/c that he uses for community outings. She reports he requires assist with all ADLs and is impulsive but does require assist with transfers for safety- but states he does go to bathroom on his own at times.    Limitations Lifting;Standing;Walking;Writing;Reading;House hold activities    How long can you sit comfortably? no issues or limits    How long can you stand comfortably? <    How long can you walk comfortably? Has not walked since CVA    Patient Stated Goals Caregiver goal- to be as independent and do as much as he can on his own.    Currently in Pain? No/denies                 Treatment provided this session  Therex:   Nustep L UE and B LE level 2 x 5 min -belt around knees to improve hip alignment  -Mod/max stand pivot A in ordert to transfer   Soccer ball 3x15 with manual assistance in order to provide history induced movement therapy in order to provide adequate quad contraction.  With this technique patient able to perform minimal quad contraction at between 90 and 80 degrees of knee flexion.  Abductor squeeze 2 sets of 10 repetitions. -Improved efficacy with this test compared to previous session able to utilize right  lower extremity for ball squeeze in the session compared to last session where patient mainly utilized left lower extremity for squeeze  Heel raises 2 sets of 20 repetitions.  Manual and tactile cues in order to show patient proper movement following these cues patient able to perform minimal heel raises on the right side.  Much improvement from previous session or little to no activation occurred.  Hamstring curls isometric with DynaDisc underfoot 3 sets of 10 repetitions. -Minimal hamstring contraction noted but some pressure in DynaDisc was helpful  Attempted standing weight shift exercise with mod assist for transition from sit to stand patient unable to perform weight shifting towards right side due to fear of imbalance  and decreased proprioception sensation.  Attempted another round in parallel bars with upper extremity support on the left side and patient was better able to tolerate exercise and was able to shift minimal amount of weight through right lower extremity.  Patient did require manual placement of the right lower extremity in order to allow for proper weight shifting to occur.   PT Short Term Goals - 10/25/20 2336       PT SHORT TERM GOAL #1   Title Pt will be independent with HEP in order to improve strength and balance in order to decrease fall risk and improve function at home and work.    Baseline 10/25/2020= Patient has no formal HEP in place    Time 6    Period Weeks    Target Date 12/06/20      PT SHORT TERM GOAL #2   Title Patient will perform all sit to stand transfers with CGA for improved functional mobility and decreased dependence on caregivers.    Baseline 10/25/2020=Patient requires mod assist with all sit to stand transfers    Time 6    Period Weeks    Status New    Target Date 12/06/20               PT Long Term Goals - 10/25/20 2347       PT LONG TERM GOAL #1   Title Pt will improve FOTO to target score of 53  to display perceived improvements in ability to complete ADL's.    Baseline 10/25/2020= 29    Time 12    Period Weeks    Status New    Target Date 01/17/21      PT LONG TERM GOAL #2   Title Patient will perform stand pivot transfer with supervision from all surfaces without loss of balance or VC for safety.    Baseline 10/25/2020- Patient requires Mod assist for safe transfers with min ability to place any weight through right LE.    Time 12    Period Weeks    Status New    Target Date 01/17/21      PT LONG TERM GOAL #3   Title Patient will perform static stand with least restrictive assistive device > 2 min with > 25% weight bearing through right LE without LOB.    Baseline 10/25/2020- Patient able to stand approx 30 sec yet did not place any weight  through Right LE.    Time 12    Period Weeks    Status New    Target Date 01/17/21                   Plan - 11/06/20 1652     Clinical Impression Statement Demonstrated improved tolerance with therapeutic exercises performed in therapy session today.  Patient continues to demonstrate fair tolerance of therapeutic exercises and has some difficulty with motivation.  Patient continues to require mod to max cueing order to recruit the appropriate muscle groups with all exercises performed.  Patient better able to demonstrate minimal quad contraction hamstring activation in this session compared to previous session.  Patient also able to perform heel raise and minimal weight shifting with improved efficacy compared to previous sessions.  Patient continued benefit from skilled physical therapy services in order to improve his balance, decrease risk of falls, and improve overall functional capacity.    Personal Factors and Comorbidities Comorbidity 3+;Time since onset of injury/illness/exacerbation    Comorbidities CVA, Aphasia, Hyperlipidemia    Examination-Activity Limitations Bathing;Bed Mobility;Bend;Caring for Others;Carry;Lift;Reach Overhead;Squat;Stairs;Stand;Toileting;Transfers    Examination-Participation Restrictions Cleaning;Community Activity;Driving;Laundry;Medication Management;Meal Prep;Occupation;Personal Finances;Shop;Yard Work    Rehab Potential Fair    PT Frequency 1x / week    PT Duration 12 weeks    PT Treatment/Interventions ADLs/Self Care Home Management;Aquatic Therapy;Cryotherapy;Electrical Stimulation;Moist Heat;Ultrasound;DME Instruction;Gait training;Stair training;Functional mobility training;Therapeutic activities;Therapeutic exercise;Balance training;Neuromuscular re-education;Patient/family education;Wheelchair mobility training;Manual techniques;Passive range of motion;Dry needling;Splinting;Orthotic Fit/Training;Taping    PT Next Visit Plan Instruct in LE  Strengthening for Home program    PT Home Exercise Plan To be initiated next visit    Consulted and Agree with Plan of Care Patient;Family member/caregiver    Family Member Consulted Sister- [name]             Patient will benefit from skilled therapeutic intervention in order to improve the following deficits and impairments:  Abnormal gait, Decreased activity tolerance, Decreased balance, Decreased cognition, Decreased coordination, Decreased endurance, Decreased knowledge of use of DME, Decreased mobility, Decreased range of motion, Decreased safety awareness, Decreased strength, Difficulty walking, Hypomobility, Impaired perceived functional ability, Impaired flexibility, Impaired sensation, Impaired UE functional use  Visit Diagnosis: Unsteadiness on feet  Muscle weakness (generalized)  Difficulty in walking, not elsewhere classified     Problem List Patient Active Problem List   Diagnosis Date Noted   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

## 2020-11-07 NOTE — Therapy (Signed)
[facility] MAIN [facility] SERVICES [address] [place], [place], [address] Phone: [phone]   Fax:  [phone]  Speech Language Pathology Treatment  Patient Details  Name: Jonathan Newman MRN: [id number] Date of Birth: Apr 05, 1960 Referring Provider (SLP): Dr. Cristopher Peru   Encounter Date: 11/06/2020   End of Session - 11/07/20 0832     Visit Number 4    Number of Visits 15    Date for SLP Re-Evaluation 01/23/21    Authorization Type Medicaid - 27 visits combined for SLP/PT    Authorization - Visit Number 3    Authorization - Number of Visits 15    Progress Note Due on Visit 10    SLP Start Time 1510   10 min late   SLP Stop Time  1600    SLP Time Calculation (min) 50 min    Activity Tolerance Patient tolerated treatment well             Past Medical History:  Diagnosis Date   Constipation    CVA (cerebral vascular accident) (HCC)    Diarrhea    Dysarthria and anarthria    Hemiplegia and hemiparesis following cerebral infarction affecting right dominant side (HCC)    Hyperlipidemia    Pain in right leg     No past surgical history on file.  There were no vitals filed for this visit.   Subjective Assessment - 11/07/20 0818     Subjective Sister reports using Lingraphica for pt to choose his snack    Currently in Pain? No/denies                   ADULT SLP TREATMENT - 11/07/20 0819       General Information   Behavior/Cognition Alert;Cooperative;Decreased sustained attention    HPI Jonathan Newman is a 60 y.o. male referred by Dr. Sherryll Burger for SLP evaluation. Hx noted for DVT, HTN Pt with reported CVA in February 2021 (records from OSH are unavailable), with subsequent rehabilitation in [place]. Pt now living with his sister in [place]. Repeat MRI brain on 09/29/20 showed "Large chronic left MCA territory and left MCA/PCA watershed territory cortical/subcortical infarct, as described. Superimposed chronic perforator  infarct within the left centrum semiovale/corona radiata and left basal ganglia. Associated wallerian degeneration on the left." Chronic infarcts also within left thalamus and bilateral pons, and mild generalized cerebral and cerebellar atrophy.      Cognitive-Linquistic Treatment   Treatment focused on Aphasia    Skilled Treatment Pt gestured he wanted sister to sit outside tx room today, so she returned to lobby. Had follow-up with Dr. Sherryll Burger last week and Depakote was increased for agitation. Pt selected icons, "I am frustrated." and "I'm sorry;" question if pt attempting to apologize for verbal outburst during previous session but difficult to verify this. When SLP asked how pt feeling now, he selected, "I am happy." Focused on communicating likes/dislikes with foods. Pt required occasional redirection (attention) as SLP added icons to personalized section ([employer]ony[employer] favorites). Pt curious about menu and device settings. Demonstrated these briefly; pt gesturing to internet and email icons. SLP told pt that our sessions will focus on communicating using the device, but SLP would demonstrate to sister how to connect email/wifi for use at home.      Assessment / Recommendations / Plan   Plan Continue with current plan of care      Progression Toward Goals   Progression toward goals Progressing toward goals  SLP Education - 11/07/20 [phone]     Education Details ST focus is using device to communicate    Person(s) Educated Patient   sister   Methods Explanation    Comprehension Verbalized understanding              SLP Short Term Goals - 10/25/20 1552       SLP SHORT TERM GOAL #1   Title Patient will comprehend simple Y/N questions >80% accuracy with written cues/ AAC support.    Baseline 60%    Time 5    Period Weeks    Status New      SLP SHORT TERM GOAL #2   Title Patient will indicate when he does not comprehend in >80% of trials (word to sentence level), using  facial expression, gesture, or AAC.    Baseline 0%    Time 5    Period Weeks    Status New      SLP SHORT TERM GOAL #3   Title Patient will answer simple biographical questions 80% accuracy (Y/N questions or AAC).    Baseline <50%    Time 5    Period Weeks    Status New              SLP Long Term Goals - 10/25/20 1556       SLP LONG TERM GOAL #1   Title Pt will communicate emergency information 100% accuracy, using AAC if necessary.    Baseline 0%    Time 12    Period Weeks    Status New    Target Date 01/23/21      SLP LONG TERM GOAL #2   Title Patient will reduce frustration by communicating thoughts/feelings with AAC in 80% of opportunities.    Baseline can only use facial expressions    Time 12    Period Weeks    Status New    Target Date 01/23/21      SLP LONG TERM GOAL #3   Title Pt will successfully convey requests for outings/ personal needs outside of ST >75% of the time, using AAC if necessary.    Baseline 0%    Time 12    Period Weeks    Status New    Target Date 01/23/21      SLP LONG TERM GOAL #4   Title Pt will reduce social isolation risk by participating in simple conversational exchange (3-5 minutes), using AAC.    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 Patient does appear to have some ability to comprehend pieces of information in context, however he often nods his head and makes verbal responses to indicate comprehension when he does not understand. Very eager and initiating communication attempts with Lingraphica tablet; able to indicate food preferences/dislikes during session today. I recommend skilled ST with focus on improving functional communication, trialing use of  AAC to determine best way to support pt in communicating/comprehending.    Speech Therapy Frequency 2x / week    Duration 12 weeks   ST limited to 14 visits this year due to Medicaid visit limit (27 combined)   Treatment/Interventions Language facilitation;Environmental  controls;Cueing hierarchy;SLP instruction and feedback;Compensatory techniques;Cognitive reorganization;Functional tasks;Compensatory strategies;Patient/family education;Internal/external aids;Multimodal communcation approach    Potential to Achieve Goals Good   with AAC   Potential Considerations Severity of impairments;Financial resources   has good family support, appears highly motivated   Consulted and Agree with Plan of Care Patient             Patient will benefit from skilled therapeutic intervention in order to improve the following deficits and impairments:   Aphasia  Verbal apraxia  Cognitive communication deficit    Problem List Patient Active Problem List   Diagnosis Date Noted   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 [place], [place], [address] Phone: [phone]   Fax:  [phone]   Name: Bijan Ridgley MRN: [id number] Date of Birth: 11/12/1960

## 2020-11-08 ENCOUNTER — Ambulatory Visit: Payer: Medicaid Other | Admitting: Physical Therapy

## 2020-11-08 ENCOUNTER — Ambulatory Visit: Payer: Medicaid Other | Admitting: Speech Pathology

## 2020-11-08 ENCOUNTER — Other Ambulatory Visit: Payer: Self-pay

## 2020-11-08 ENCOUNTER — Encounter: Payer: Medicaid Other | Admitting: Speech Pathology

## 2020-11-08 ENCOUNTER — Ambulatory Visit: Payer: Medicaid Other | Admitting: Urology

## 2020-11-08 ENCOUNTER — Ambulatory Visit: Payer: Medicaid Other

## 2020-11-08 DIAGNOSIS — R4701 Aphasia: Secondary | ICD-10-CM | POA: Diagnosis not present

## 2020-11-08 DIAGNOSIS — R41841 Cognitive communication deficit: Secondary | ICD-10-CM

## 2020-11-08 DIAGNOSIS — R482 Apraxia: Secondary | ICD-10-CM

## 2020-11-09 NOTE — Therapy (Signed)
[facility] MAIN [facility] SERVICES [address] [place], [place], [address] Phone: [phone]   Fax:  [phone]  Speech Language Pathology Treatment  Patient Details  Name: Jonathan Newman MRN: [id number] Date of Birth: Oct 12, 1960 Referring Provider (SLP): Dr. Cristopher Peru   Encounter Date: 11/08/2020   End of Session - 11/09/20 0821     Visit Number 5    Number of Visits 15    Date for SLP Re-Evaluation 01/23/21    Authorization Type Medicaid - 27 visits combined for SLP/PT    Authorization - Visit Number 4    Authorization - Number of Visits 15    Progress Note Due on Visit 10    SLP Start Time 1505    SLP Stop Time  1600    SLP Time Calculation (min) 55 min    Activity Tolerance Patient tolerated treatment well             Past Medical History:  Diagnosis Date   Constipation    CVA (cerebral vascular accident) (HCC)    Diarrhea    Dysarthria and anarthria    Hemiplegia and hemiparesis following cerebral infarction affecting right dominant side (HCC)    Hyperlipidemia    Pain in right leg     No past surgical history on file.  There were no vitals filed for this visit.   Subjective Assessment - 11/09/20 0807     Subjective Navigated independently to select "Chinese food." Sister reports pt had this for lunch    Currently in Pain? No/denies                   ADULT SLP TREATMENT - 11/09/20 0808       General Information   Behavior/Cognition Alert;Cooperative;Decreased sustained attention    HPI Jonathan Newman is a 60 y.o. male referred by Dr. Sherryll Burger for SLP evaluation. Hx noted for DVT, HTN Pt with reported CVA in February 2021 (records from OSH are unavailable), with subsequent rehabilitation in [place]. Pt now living with his sister in [place]. Repeat MRI brain on 09/29/20 showed "Large chronic left MCA territory and left MCA/PCA watershed territory cortical/subcortical infarct, as described. Superimposed chronic  perforator infarct within the left centrum semiovale/corona radiata and left basal ganglia. Associated wallerian degeneration on the left." Chronic infarcts also within left thalamus and bilateral pons, and mild generalized cerebral and cerebellar atrophy.      Cognitive-Linquistic Treatment   Treatment focused on Aphasia    Skilled Treatment Pt confirmed via head nod/gesture that he wanted sister to join session today. Pt showed sister "[employer] Favorites," selected "milkshake." Pt vocalized affirmatively for "strawberry" when flavor choices presented. Demonstrated device customization/icon creation to add pt's preferred flavors. Pt opened "things I need help with" (blank), and gestured to request explanation. SLP gave examples and pt vocalized, pointing at sister. Sister provided examples of things she assists pt with, and these were added (use the bathroom, i want to go somewhere, can you get me something at [facility], i have a headache, i need my medicine, etc). At end of session pt gestured that he needed to use the bathroom. With min cue, pt activated this request on Lingraphica tablet.      Assessment / Recommendations / Plan   Plan Continue with current plan of care      Progression Toward Goals   Progression toward goals Progressing toward goals              SLP Education -  11/09/20 0819     Education Details how to customize, move icons    Person(s) Educated Patient;Other (comment)   sister   Methods Explanation    Comprehension Verbalized understanding              SLP Short Term Goals - 10/25/20 1552       SLP SHORT TERM GOAL #1   Title Patient will comprehend simple Y/N questions >80% accuracy with written cues/ AAC support.    Baseline 60%    Time 5    Period Weeks    Status New      SLP SHORT TERM GOAL #2   Title Patient will indicate when he does not comprehend in >80% of trials (word to sentence level), using facial expression, gesture, or AAC.     Baseline 0%    Time 5    Period Weeks    Status New      SLP SHORT TERM GOAL #3   Title Patient will answer simple biographical questions 80% accuracy (Y/N questions or AAC).    Baseline <50%    Time 5    Period Weeks    Status New              SLP Long Term Goals - 10/25/20 1556       SLP LONG TERM GOAL #1   Title Pt will communicate emergency information 100% accuracy, using AAC if necessary.    Baseline 0%    Time 12    Period Weeks    Status New    Target Date 01/23/21      SLP LONG TERM GOAL #2   Title Patient will reduce frustration by communicating thoughts/feelings with AAC in 80% of opportunities.    Baseline can only use facial expressions    Time 12    Period Weeks    Status New    Target Date 01/23/21      SLP LONG TERM GOAL #3   Title Pt will successfully convey requests for outings/ personal needs outside of ST >75% of the time, using AAC if necessary.    Baseline 0%    Time 12    Period Weeks    Status New    Target Date 01/23/21      SLP LONG TERM GOAL #4   Title Pt will reduce social isolation risk by participating in simple conversational exchange (3-5 minutes), using AAC.    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 Visual cues (AAC icons) appear to increase pt's comprehension; pt demonstrating emerging ability to navigate communication device to communicate basic wants/needs. Very eager and initiating communication attempts with Lingraphica tablet. I recommend skilled ST with focus on improving functional communication, trialing use of AAC to determine best way to support pt in communicating/comprehending.    Speech Therapy Frequency 2x / week    Duration 12 weeks   ST limited  to 14 visits this year due to Medicaid visit limit (27 combined)   Treatment/Interventions Language facilitation;Environmental controls;Cueing hierarchy;SLP instruction and feedback;Compensatory techniques;Cognitive reorganization;Functional tasks;Compensatory strategies;Patient/family education;Internal/external aids;Multimodal communcation approach    Potential to Achieve Goals Good  with AAC   Potential Considerations Severity of impairments;Financial resources   has good family support, appears highly motivated   Consulted and Agree with Plan of Care Patient             Patient will benefit from skilled therapeutic intervention in order to improve the following deficits and impairments:   Aphasia  Verbal apraxia  Cognitive communication deficit    Problem List Patient Active Problem List   Diagnosis Date Noted   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 [place], [place], [address] Phone: [phone]   Fax:  [phone]   Name: Jonathan Newman MRN: [id number] Date of Birth: 03-Mar-1960

## 2020-11-13 ENCOUNTER — Ambulatory Visit: Payer: Medicaid Other | Admitting: Speech Pathology

## 2020-11-13 ENCOUNTER — Other Ambulatory Visit: Payer: Self-pay

## 2020-11-13 ENCOUNTER — Ambulatory Visit: Payer: Medicaid Other | Admitting: Physical Therapy

## 2020-11-13 DIAGNOSIS — R2681 Unsteadiness on feet: Secondary | ICD-10-CM

## 2020-11-13 DIAGNOSIS — R41841 Cognitive communication deficit: Secondary | ICD-10-CM

## 2020-11-13 DIAGNOSIS — R482 Apraxia: Secondary | ICD-10-CM

## 2020-11-13 DIAGNOSIS — R262 Difficulty in walking, not elsewhere classified: Secondary | ICD-10-CM

## 2020-11-13 DIAGNOSIS — R4701 Aphasia: Secondary | ICD-10-CM

## 2020-11-13 DIAGNOSIS — M6281 Muscle weakness (generalized): Secondary | ICD-10-CM

## 2020-11-13 NOTE — Therapy (Signed)
[facility] MAIN [facility] SERVICES [address] [place], [place], [address] Phone: [phone]   Fax:  [phone]  Physical Therapy Treatment  Patient Details  Name: Jonathan Newman MRN: [id number] Date of Birth: [birth year] No data recorded  Encounter Date: 11/13/2020   PT End of Session - 11/13/20 1610     Visit Number 4    Number of Visits 25    Date for PT Re-Evaluation 01/17/21    Authorization Type Medicaid=Authorized combined PT/OT/SLP 27 visits    Authorization - Visit Number 3    Authorization - Number of Visits 3    Progress Note Due on Visit 10    PT Start Time 1600    PT Stop Time 1644    PT Time Calculation (min) 44 min    Equipment Utilized During Treatment Gait belt    Activity Tolerance Patient tolerated treatment well    Behavior During Therapy Restless;Anxious;Impulsive             Past Medical History:  Diagnosis Date   Constipation    CVA (cerebral vascular accident) (HCC)    Diarrhea    Dysarthria and anarthria    Hemiplegia and hemiparesis following cerebral infarction affecting right dominant side (HCC)    Hyperlipidemia    Pain in right leg     No past surgical history on file.  There were no vitals filed for this visit.   Subjective Assessment - 11/13/20 1603     Subjective Pt reports to PT following speech therapy, no changes since last session and nothing new to report per caregiver. Ot has been completing HEP, has had continued trouble with weight shifting type activities.    Pertinent History Patient is a 60 year old male with recent history of L MCA stroke in Feb 2021.Per Dr. 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 Had developed DVT afterwards. Patient is present with sister, [name] today who reports he has  been living with her since May 2022 and uses his electric w/c in the home an has not walked since CVA- has manual w/c that he uses for community outings. She reports he requires assist with all ADLs and is impulsive but does require assist with transfers for safety- but states he does go to bathroom on his own at times.    Limitations Lifting;Standing;Walking;Writing;Reading;House hold activities    How long can you sit comfortably? no issues or limits    How long can you stand comfortably? <    How long can you walk comfortably? Has not walked since CVA    Patient Stated Goals Caregiver goal- to be as independent and do as much as he can on his own.    Currently in Pain? No/denies                 Treatment provided this session  Therex:   Nustep level 2 at seat 8, strap around legs to prevent ER of right hip.  -x 5 min -modA for transfer to/from WC to [employer] activation exercises in seated position )(cues with hand anterior and superior to L foot, soccer ball for kicking,  -with manual assistance in order to provide constraint induced movement therapy in order to provide adequate but still minimal quad contraction.  With this  technique patient able to perform minimal quad contraction at between 90 and 80 degrees of knee flexion. -pt tends to perform hip flexion to accomplish this task.    Abductor squeeze 2 sets of 10 repetitions. -Improved efficacy with this test compared to previous session able to utilize right lower extremity for ball squeeze in the session compared to last session where patient mainly utilized left lower extremity for squeeze    Hamstring curls isometric with DynaDisc underfoot 3 sets of 10 repetitions. -Minimal hamstring contraction noted but some pressure in DynaDisc was noted, improved efficacy with feedback for successful attempts   Attempted standing weight shift exercise with mod assist for transition from sit to stand patient  unable to perform weight shifting towards right side due to fear of imbalance. performed in parallel bars with upper extremity support on the left side and patient was better able to tolerate exercise and was able to shift minimal amount of weight through right lower extremity. Attempted with 1 in platform under right heel to increase support and weightshift to right Patient did require manual placement of the right lower extremity in order to allow for proper weight shifting to occur. -several attempts of 5 weight shifting repetitions followed by rest due to patient fatigue  -Pt tends to stand on L LE only.      Pt required occasional rest breaks due fatigue, PT was quick to ask when pt appeared to be fatiguing in order to prevent excessive fatigue.  Pt educated throughout session about proper posture and technique with exercises. Improved exercise technique, movement at target joints, use of target muscles after min to mod verbal, visual, tactile cues.                        PT Education - 11/13/20 1609     Education Details Exercise form and technique    Person(s) Educated Patient    Methods Explanation;Demonstration;Verbal cues;Handout;Tactile cues    Comprehension Verbalized understanding;Returned demonstration;Verbal cues required              PT Short Term Goals - 10/25/20 2336       PT SHORT TERM GOAL #1   Title Pt will be independent with HEP in order to improve strength and balance in order to decrease fall risk and improve function at home and work.    Baseline 10/25/2020= Patient has no formal HEP in place    Time 6    Period Weeks    Target Date 12/06/20      PT SHORT TERM GOAL #2   Title Patient will perform all sit to stand transfers with CGA for improved functional mobility and decreased dependence on caregivers.    Baseline 10/25/2020=Patient requires mod assist with all sit to stand transfers    Time 6    Period Weeks    Status New    Target  Date 12/06/20               PT Long Term Goals - 10/25/20 2347       PT LONG TERM GOAL #1   Title Pt will improve FOTO to target score of 53  to display perceived improvements in ability to complete ADL's.    Baseline 10/25/2020= 29    Time 12    Period Weeks    Status New    Target Date 01/17/21      PT LONG TERM GOAL #2   Title Patient will perform stand pivot  transfer with supervision from all surfaces without loss of balance or VC for safety.    Baseline 10/25/2020- Patient requires Mod assist for safe transfers with min ability to place any weight through right LE.    Time 12    Period Weeks    Status New    Target Date 01/17/21      PT LONG TERM GOAL #3   Title Patient will perform static stand with least restrictive assistive device > 2 min with > 25% weight bearing through right LE without LOB.    Baseline 10/25/2020- Patient able to stand approx 30 sec yet did not place any weight through Right LE.    Time 12    Period Weeks    Status New    Target Date 01/17/21                   Plan - 11/13/20 1610     Clinical Impression Statement Pt demonstrated good tolerance to therapeutic exercise as performed in clinic today. Pt continues ot have significant difficulty when attempting to perform weightbearing activities through his involved side (R) and also continues to deomnstrate compensatory patterns when attempting to isloate target musculature on the R LE. Pt will continue to benefit from skilled PT intervention in order to ipmrove his strength, balance, mobility and overall function.    Personal Factors and Comorbidities Comorbidity 3+;Time since onset of injury/illness/exacerbation    Comorbidities CVA, Aphasia, Hyperlipidemia    Examination-Activity Limitations Bathing;Bed Mobility;Bend;Caring for Others;Carry;Lift;Reach Overhead;Squat;Stairs;Stand;Toileting;Transfers    Examination-Participation Restrictions Cleaning;Community  Activity;Driving;Laundry;Medication Management;Meal Prep;Occupation;Personal Finances;Shop;Yard Work    Rehab Potential Fair    PT Frequency 1x / week    PT Duration 12 weeks    PT Treatment/Interventions ADLs/Self Care Home Management;Aquatic Therapy;Cryotherapy;Electrical Stimulation;Moist Heat;Ultrasound;DME Instruction;Gait training;Stair training;Functional mobility training;Therapeutic activities;Therapeutic exercise;Balance training;Neuromuscular re-education;Patient/family education;Wheelchair mobility training;Manual techniques;Passive range of motion;Dry needling;Splinting;Orthotic Fit/Training;Taping    PT Next Visit Plan Instruct in LE Strengthening for Home program    PT Home Exercise Plan To be initiated next visit    Consulted and Agree with Plan of Care Patient;Family member/caregiver    Family Member Consulted Sister- [name]             Patient will benefit from skilled therapeutic intervention in order to improve the following deficits and impairments:  Abnormal gait, Decreased activity tolerance, Decreased balance, Decreased cognition, Decreased coordination, Decreased endurance, Decreased knowledge of use of DME, Decreased mobility, Decreased range of motion, Decreased safety awareness, Decreased strength, Difficulty walking, Hypomobility, Impaired perceived functional ability, Impaired flexibility, Impaired sensation, Impaired UE functional use  Visit Diagnosis: Unsteadiness on feet  Muscle weakness (generalized)  Difficulty in walking, not elsewhere classified     Problem List Patient Active Problem List   Diagnosis Date Noted   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  [place], [place], [address] Phone: [phone]   Fax:  [phone]  Name: Dontrez Pettis MRN: [id number] Date of Birth: November 14, 1960
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
## 2020-11-15 ENCOUNTER — Encounter: Payer: Medicaid Other | Admitting: Speech Pathology

## 2020-11-15 ENCOUNTER — Other Ambulatory Visit: Payer: Self-pay

## 2020-11-15 ENCOUNTER — Ambulatory Visit: Payer: Medicaid Other | Admitting: Speech Pathology

## 2020-11-15 ENCOUNTER — Ambulatory Visit: Payer: Medicaid Other

## 2020-11-15 DIAGNOSIS — R4701 Aphasia: Secondary | ICD-10-CM | POA: Diagnosis not present

## 2020-11-15 DIAGNOSIS — R41841 Cognitive communication deficit: Secondary | ICD-10-CM

## 2020-11-15 DIAGNOSIS — R482 Apraxia: Secondary | ICD-10-CM

## 2020-11-15 NOTE — Therapy (Signed)
[facility] MAIN [facility] SERVICES [address] [place], [place], [address] Phone: [phone]   Fax:  [phone]  Speech Language Pathology Treatment  Patient Details  Name: Jonathan Newman MRN: [id number] Date of Birth: May 02, 1960 Referring Provider (SLP): Dr. Cristopher Peru   Encounter Date: 11/15/2020   End of Session - 11/15/20 1734     Visit Number 7    Number of Visits 15    Date for SLP Re-Evaluation 01/23/21    Authorization Type Medicaid - 27 visits combined for SLP/PT    Authorization - Visit Number 6    Authorization - Number of Visits 15    Progress Note Due on Visit 10    SLP Start Time 1500    SLP Stop Time  1600    SLP Time Calculation (min) 60 min    Activity Tolerance Patient tolerated treatment well             Past Medical History:  Diagnosis Date   Constipation    CVA (cerebral vascular accident) (HCC)    Diarrhea    Dysarthria and anarthria    Hemiplegia and hemiparesis following cerebral infarction affecting right dominant side (HCC)    Hyperlipidemia    Pain in right leg     No past surgical history on file.  There were no vitals filed for this visit.   Subjective Assessment - 11/15/20 1729     Subjective Reaches for Lingraphica    Patient is accompained by: Family member   sister [name]   Currently in Pain? No/denies                   ADULT SLP TREATMENT - 11/15/20 1729       General Information   Behavior/Cognition Alert;Cooperative;Decreased sustained attention    HPI Jonathan Newman is a 60 y.o. male referred by Dr. Sherryll Burger for SLP evaluation. Hx noted for DVT, HTN Pt with reported CVA in February 2021 (records from OSH are unavailable), with subsequent rehabilitation in [place]. Pt now living with his sister in [place]. Repeat MRI brain on 09/29/20 showed "Large chronic left MCA territory and left MCA/PCA watershed territory cortical/subcortical infarct, as described. Superimposed chronic  perforator infarct within the left centrum semiovale/corona radiata and left basal ganglia. Associated wallerian degeneration on the left." Chronic infarcts also within left thalamus and bilateral pons, and mild generalized cerebral and cerebellar atrophy.      Cognitive-Linquistic Treatment   Treatment focused on Aphasia    Skilled Treatment Sister requested re-instruction on adding icons to tablet. Pt scrolled through his phone contacts to show names of friends he wanted to add. After demo, sister able to return demonstration to create icons for friends and pets. Pt navigating device and accessing icons, returning to home screen efficiently; he requires cues to focus on the topic and is at times distracted by other icons such as food items, or things he finds sad or funny. Able to communicate dinner choice to sister with min cues. Demo'd to sister how to cue pt to use the device for communication vs "quizzing" him to see if he understands her. Sister returned demonstration by offering pt flavor choices for milkshake; pt indicated his selection.      Assessment / Recommendations / Plan   Plan Continue with current plan of care      Progression Toward Goals   Progression toward goals Progressing toward goals  SLP Education - 11/15/20 1734     Education Details communication vs quizzing    Person(s) Educated Patient;Caregiver(s)    Methods Explanation;Demonstration    Comprehension Verbalized understanding;Returned demonstration;Verbal cues required;Need further instruction              SLP Short Term Goals - 10/25/20 1552       SLP SHORT TERM GOAL #1   Title Patient will comprehend simple Y/N questions >80% accuracy with written cues/ AAC support.    Baseline 60%    Time 5    Period Weeks    Status New      SLP SHORT TERM GOAL #2   Title Patient will indicate when he does not comprehend in >80% of trials (word to sentence level), using facial expression, gesture,  or AAC.    Baseline 0%    Time 5    Period Weeks    Status New      SLP SHORT TERM GOAL #3   Title Patient will answer simple biographical questions 80% accuracy (Y/N questions or AAC).    Baseline <50%    Time 5    Period Weeks    Status New              SLP Long Term Goals - 10/25/20 1556       SLP LONG TERM GOAL #1   Title Pt will communicate emergency information 100% accuracy, using AAC if necessary.    Baseline 0%    Time 12    Period Weeks    Status New    Target Date 01/23/21      SLP LONG TERM GOAL #2   Title Patient will reduce frustration by communicating thoughts/feelings with AAC in 80% of opportunities.    Baseline can only use facial expressions    Time 12    Period Weeks    Status New    Target Date 01/23/21      SLP LONG TERM GOAL #3   Title Pt will successfully convey requests for outings/ personal needs outside of ST >75% of the time, using AAC if necessary.    Baseline 0%    Time 12    Period Weeks    Status New    Target Date 01/23/21      SLP LONG TERM GOAL #4   Title Pt will reduce social isolation risk by participating in simple conversational exchange (3-5 minutes), using AAC.    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 Visual cues (AAC icons) appear to increase pt's comprehension; pt demonstrating emerging ability to navigate communication device to communicate basic wants/needs. Pt used device to select items for dinner this evening and to make a joke about his dog. I recommend skilled ST with focus on improving functional communication, trialing use of AAC to determine best way to support pt in communicating/comprehending.    Speech Therapy Frequency 2x / week     Duration 12 weeks   ST limited to 14 visits this year due to Medicaid visit limit (27 combined)   Treatment/Interventions Language facilitation;Environmental controls;Cueing hierarchy;SLP instruction and feedback;Compensatory techniques;Cognitive reorganization;Functional tasks;Compensatory strategies;Patient/family education;Internal/external aids;Multimodal  communcation approach    Potential to Achieve Goals Good   with AAC   Potential Considerations Severity of impairments;Financial resources   has good family support, appears highly motivated   Consulted and Agree with Plan of Care Patient             Patient will benefit from skilled therapeutic intervention in order to improve the following deficits and impairments:   Aphasia  Verbal apraxia  Cognitive communication deficit    Problem List Patient Active Problem List   Diagnosis Date Noted   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 [place], [place], [address] Phone: [phone]   Fax:  [phone]   Name: Jonathan Newman MRN: [id number] Date of Birth: 1960-07-03

## 2020-11-20 ENCOUNTER — Ambulatory Visit: Payer: Medicaid Other

## 2020-11-22 ENCOUNTER — Ambulatory Visit: Payer: Medicaid Other

## 2020-11-22 ENCOUNTER — Encounter: Payer: Medicaid Other | Admitting: Speech Pathology

## 2020-11-22 ENCOUNTER — Ambulatory Visit: Payer: Medicaid Other | Admitting: Physical Therapy

## 2020-11-22 ENCOUNTER — Ambulatory Visit: Payer: Medicaid Other | Admitting: Urology

## 2020-11-23 ENCOUNTER — Ambulatory Visit: Payer: Medicaid Other | Admitting: Speech Pathology

## 2020-11-23 ENCOUNTER — Encounter: Payer: Self-pay | Admitting: Physical Therapy

## 2020-11-23 ENCOUNTER — Ambulatory Visit: Payer: Medicaid Other | Attending: Family Medicine | Admitting: Physical Therapy

## 2020-11-23 ENCOUNTER — Other Ambulatory Visit: Payer: Self-pay

## 2020-11-23 DIAGNOSIS — R2681 Unsteadiness on feet: Secondary | ICD-10-CM | POA: Diagnosis not present

## 2020-11-23 DIAGNOSIS — R482 Apraxia: Secondary | ICD-10-CM | POA: Insufficient documentation

## 2020-11-23 DIAGNOSIS — R2689 Other abnormalities of gait and mobility: Secondary | ICD-10-CM | POA: Insufficient documentation

## 2020-11-23 DIAGNOSIS — R278 Other lack of coordination: Secondary | ICD-10-CM | POA: Diagnosis present

## 2020-11-23 DIAGNOSIS — R4701 Aphasia: Secondary | ICD-10-CM

## 2020-11-23 DIAGNOSIS — M6281 Muscle weakness (generalized): Secondary | ICD-10-CM | POA: Insufficient documentation

## 2020-11-23 DIAGNOSIS — R41841 Cognitive communication deficit: Secondary | ICD-10-CM | POA: Insufficient documentation

## 2020-11-23 DIAGNOSIS — R262 Difficulty in walking, not elsewhere classified: Secondary | ICD-10-CM | POA: Diagnosis present

## 2020-11-23 NOTE — Therapy (Signed)
[facility] MAIN [facility] SERVICES [address] [place], [place], [address] Phone: [phone]   Fax:  [phone]  Speech Language Pathology Treatment  Patient Details  Name: Jonathan Newman MRN: [id number] Date of Birth: 09-15-1960 Referring Provider (SLP): Dr. Cristopher Peru   Encounter Date: 11/23/2020   End of Session - 11/23/20 1609     Visit Number 8    Number of Visits 15    Date for SLP Re-Evaluation 01/23/21    Authorization Type Medicaid - 27 visits combined for SLP/PT    Authorization - Visit Number 7    Authorization - Number of Visits 15    Progress Note Due on Visit 10    SLP Start Time 1515    SLP Stop Time  1600    SLP Time Calculation (min) 45 min    Activity Tolerance Patient tolerated treatment well             Past Medical History:  Diagnosis Date   Constipation    CVA (cerebral vascular accident) (HCC)    Diarrhea    Dysarthria and anarthria    Hemiplegia and hemiparesis following cerebral infarction affecting right dominant side (HCC)    Hyperlipidemia    Pain in right leg     No past surgical history on file.  There were no vitals filed for this visit.   Subjective Assessment - 11/23/20 1602     Subjective Sister reports pt using device to communicate with friend and to make food requests    Currently in Pain? No/denies                   ADULT SLP TREATMENT - 11/23/20 1602       General Information   Behavior/Cognition Alert;Cooperative;Decreased sustained attention    HPI Jonathan Newman is a 60 y.o. male referred by Dr. Sherryll Burger for SLP evaluation. Hx noted for DVT, HTN Pt with reported CVA in February 2021 (records from OSH are unavailable), with subsequent rehabilitation in [place]. Pt now living with his sister in [place]. Repeat MRI brain on 09/29/20 showed "Large chronic left MCA territory and left MCA/PCA watershed territory cortical/subcortical infarct, as described. Superimposed chronic  perforator infarct within the left centrum semiovale/corona radiata and left basal ganglia. Associated wallerian degeneration on the left." Chronic infarcts also within left thalamus and bilateral pons, and mild generalized cerebral and cerebellar atrophy.      Cognitive-Linquistic Treatment   Treatment focused on Aphasia    Skilled Treatment Pt pointing to extra icons he added to desktop to request removal; demo'd and pt assisted with deletion with visual cues. Pt independently using device to show SLP "strawberry milkshake" and "honey chicken" (what he had for lunch). Navigating device to show new icons added (phone number, do i have therapy today?). Answered simple Wh questions with modified independence. Showed new photos taken (selfies); SLP encouraged pt/sister to take photos of home environment. Sister reports pt using the device nightly and looking at various icons. Quickly changes topic and requires cues to stay on topic. Demo'd adding more conversational language/questions to engage others in conversation. 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 Created a "memories" folder to contain these so that pt doesn't become distracted/emotional when using his device to talk about family members, personal interests.      Assessment / Recommendations / Plan   Plan Continue with current plan of care      Progression  Toward Goals   Progression toward goals Progressing toward goals              SLP Education - 11/23/20 1609     Education Details adding conversational language    Person(s) Educated Patient;Caregiver(s)    Methods Explanation    Comprehension Verbalized understanding              SLP Short Term Goals - 10/25/20 1552       SLP SHORT TERM GOAL #1   Title Patient will comprehend simple Y/N questions >80% accuracy with written cues/ AAC support.    Baseline 60%    Time 5    Period Weeks    Status New      SLP SHORT TERM GOAL #2    Title Patient will indicate when he does not comprehend in >80% of trials (word to sentence level), using facial expression, gesture, or AAC.    Baseline 0%    Time 5    Period Weeks    Status New      SLP SHORT TERM GOAL #3   Title Patient will answer simple biographical questions 80% accuracy (Y/N questions or AAC).    Baseline <50%    Time 5    Period Weeks    Status New              SLP Long Term Goals - 10/25/20 1556       SLP LONG TERM GOAL #1   Title Pt will communicate emergency information 100% accuracy, using AAC if necessary.    Baseline 0%    Time 12    Period Weeks    Status New    Target Date 01/23/21      SLP LONG TERM GOAL #2   Title Patient will reduce frustration by communicating thoughts/feelings with AAC in 80% of opportunities.    Baseline can only use facial expressions    Time 12    Period Weeks    Status New    Target Date 01/23/21      SLP LONG TERM GOAL #3   Title Pt will successfully convey requests for outings/ personal needs outside of ST >75% of the time, using AAC if necessary.    Baseline 0%    Time 12    Period Weeks    Status New    Target Date 01/23/21      SLP LONG TERM GOAL #4   Title Pt will reduce social isolation risk by participating in simple conversational exchange (3-5 minutes), using AAC.    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 Visual cues (AAC icons) appear to increase pt's comprehension; pt demonstrating emerging ability to navigate communication device to communicate basic wants/needs. Sister reports pt enjoying using the device at home and has used it to interact with friends and family. I recommend skilled ST with focus on improving  functional communication, trialing use of AAC to determine best way to support pt in communicating/comprehending.    Speech Therapy Frequency 2x / week    Duration 12 weeks   ST limited to 14 visits this year due to Medicaid visit  limit (27 combined)   Treatment/Interventions Language facilitation;Environmental controls;Cueing hierarchy;SLP instruction and feedback;Compensatory techniques;Cognitive reorganization;Functional tasks;Compensatory strategies;Patient/family education;Internal/external aids;Multimodal communcation approach    Potential to Achieve Goals Good   with AAC   Potential Considerations Severity of impairments;Financial resources   has good family support, appears highly motivated   Consulted and Agree with Plan of Care Patient             Patient will benefit from skilled therapeutic intervention in order to improve the following deficits and impairments:   Aphasia  Verbal apraxia    Problem List Patient Active Problem List   Diagnosis Date Noted   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 [place], [place], [address] Phone: [phone]   Fax:  [phone]   Name: Jonathan Newman MRN: [id number] Date of Birth: [birth year]

## 2020-11-23 NOTE — Therapy (Signed)
[facility] MAIN [facility] SERVICES [address] [place], [place], [address] Phone: [phone]   Fax:  [phone]  Physical Therapy Treatment  Patient Details  Name: Jonathan Newman MRN: [id number] Date of Birth: 1960/02/03 No data recorded  Encounter Date: 11/23/2020   PT End of Session - 11/23/20 1442     Visit Number 5    Number of Visits 25    Date for PT Re-Evaluation 01/17/21    Authorization Type Medicaid=Authorized combined PT/OT/SLP 27 visits    Authorization Time Period 9 authorized 10/31-12/31    Authorization - Visit Number 1    Authorization - Number of Visits 9    Progress Note Due on Visit 10    PT Start Time 1431    PT Stop Time 1515    PT Time Calculation (min) 44 min    Equipment Utilized During Treatment Gait belt    Activity Tolerance Patient tolerated treatment well    Behavior During Therapy Restless;Anxious;Impulsive             Past Medical History:  Diagnosis Date   Constipation    CVA (cerebral vascular accident) (HCC)    Diarrhea    Dysarthria and anarthria    Hemiplegia and hemiparesis following cerebral infarction affecting right dominant side (HCC)    Hyperlipidemia    Pain in right leg     History reviewed. No pertinent surgical history.  There were no vitals filed for this visit.   Subjective Assessment - 11/23/20 1440     Subjective Pt reports to PT with no new changes. His sister [name] reports they have been working on standing exercise. no new falls; His sister does state that when he moves his RUE (affected side) he has been complaining of pain especially when she is washing him off.    Pertinent History Patient is a 60 year old male with recent history of L MCA stroke in Feb 2021.Per Dr. Sherryll Burger note on 09/18/2020-  Likely left Middle Cerebral Artery ischemic infract causing global aphasia affecting motor more than sensory and right flaccid hemiparesis and right hemisensory loss in a patient with  hypertension, smoking, etc. Had developed DVT afterwards. Patient is present with sister, [name] today who reports he has been living with her since May 2022 and uses his electric w/c in the home an has not walked since CVA- has manual w/c that he uses for community outings. She reports he requires assist with all ADLs and is impulsive but does require assist with transfers for safety- but states he does go to bathroom on his own at times.    Limitations Lifting;Standing;Walking;Writing;Reading;House hold activities    How long can you sit comfortably? no issues or limits    How long can you stand comfortably? <    How long can you walk comfortably? Has not walked since CVA    Patient Stated Goals Caregiver goal- to be as independent and do as much as he can on his own.    Currently in Pain? No/denies    Multiple Pain Sites No                Treatment provided this session   Therex:    Nustep level 3 at seat 8, strap around legs to prevent ER of right hip.  -x 5 min -min A for transfer to/from WC to nustep     Leg Press: Required min A to transfer wheelchair<>Leg press machine and min A for positioning  RLE over center of machine Required mod A for positioning LE on leg press: BLE 55# x10 RLE Only 25# 2x10 with therapist providing mod A for positioning hip into neutral position to avoid hip ER to isolate quad activation;  PT instructed patient in sit<>Stand activities using NDT techniques to facilitate right side weight bearing Patient seated on blue mat table facing mirror for visual cues PT seated to right side of patient for tactile cues for weight shift Instructed patient in forward weight shift with therapist holding patient's RUE and max verbal and tactile cues for weight shift to right side to keep patient's right side against therapist for better weight shift Patient often leans to left side when initiating transfers using LLE and LUE for primary force of  transfer  Patient had significant difficulty shifting to RLE Therefore PT applied 2 inch board under LLE in order to unweight LLE and to also provide visual/tactile cue/target for keeping LLE in place for wide base of support  Instructed patient in sit<>Stand with LLE on board, therapist crossing foot in front of patient's LE blocking right knee for better tolerance with weight acceptance:  3 forward rocks and then partial stand for RLE weight acceptance x4 sets with max VCs and tactile cues to shift weight to RLE and keep trunk oriented to midline and avoid leaning to left side Patient required mod A from therapist for forward weight shift and partial stand;   He was able to transfer mat table to wheelchair, close supervision with patient using LUE to pull self over to wheelchair;   Patient reports increased fatigue at end of session. He initially reported pain in RLE but with repeated question, clarified that it was more fatigue. Patient does have difficulty communicating but is able to answer yes/no questions;                                 PT Education - 11/23/20 1442     Education Details exercise form/technique;    Person(s) Educated Patient    Methods Explanation;Verbal cues    Comprehension Verbalized understanding;Returned demonstration;Verbal cues required;Need further instruction              PT Short Term Goals - 10/25/20 2336       PT SHORT TERM GOAL #1   Title Pt will be independent with HEP in order to improve strength and balance in order to decrease fall risk and improve function at home and work.    Baseline 10/25/2020= Patient has no formal HEP in place    Time 6    Period Weeks    Target Date 12/06/20      PT SHORT TERM GOAL #2   Title Patient will perform all sit to stand transfers with CGA for improved functional mobility and decreased dependence on caregivers.    Baseline 10/25/2020=Patient requires mod assist with all sit to  stand transfers    Time 6    Period Weeks    Status New    Target Date 12/06/20               PT Long Term Goals - 10/25/20 2347       PT LONG TERM GOAL #1   Title Pt will improve FOTO to target score of 53  to display perceived improvements in ability to complete ADL's.    Baseline 10/25/2020= 29    Time 12    Period  Weeks    Status New    Target Date 01/17/21      PT LONG TERM GOAL #2   Title Patient will perform stand pivot transfer with supervision from all surfaces without loss of balance or VC for safety.    Baseline 10/25/2020- Patient requires Mod assist for safe transfers with min ability to place any weight through right LE.    Time 12    Period Weeks    Status New    Target Date 01/17/21      PT LONG TERM GOAL #3   Title Patient will perform static stand with least restrictive assistive device > 2 min with > 25% weight bearing through right LE without LOB.    Baseline 10/25/2020- Patient able to stand approx 30 sec yet did not place any weight through Right LE.    Time 12    Period Weeks    Status New    Target Date 01/17/21                   Plan - 11/23/20 1544     Clinical Impression Statement Patient motivated and participated well within session. He required mod A throughout session for proper positioning and exercise technique. Instructed patient in advanced exercise to facilitate RLE strengthening. Also instructed patient in NDT techniques to facilitate RLE weight shift for transfers. Patient reports increased fatigue at end of session. He was able to exhibit increased weight acceptance in RLE with repeated attempts for partial stand. Patient would benefit from additional skilled PT intervention to improve strength, balance and mobility;    Personal Factors and Comorbidities Comorbidity 3+;Time since onset of injury/illness/exacerbation    Comorbidities CVA, Aphasia, Hyperlipidemia    Examination-Activity Limitations Bathing;Bed  Mobility;Bend;Caring for Others;Carry;Lift;Reach Overhead;Squat;Stairs;Stand;Toileting;Transfers    Examination-Participation Restrictions Cleaning;Community Activity;Driving;Laundry;Medication Management;Meal Prep;Occupation;Personal Finances;Shop;Yard Work    Rehab Potential Fair    PT Frequency 1x / week    PT Duration 12 weeks    PT Treatment/Interventions ADLs/Self Care Home Management;Aquatic Therapy;Cryotherapy;Electrical Stimulation;Moist Heat;Ultrasound;DME Instruction;Gait training;Stair training;Functional mobility training;Therapeutic activities;Therapeutic exercise;Balance training;Neuromuscular re-education;Patient/family education;Wheelchair mobility training;Manual techniques;Passive range of motion;Dry needling;Splinting;Orthotic Fit/Training;Taping    PT Next Visit Plan Instruct in LE Strengthening for Home program    PT Home Exercise Plan To be initiated next visit    Consulted and Agree with Plan of Care Patient;Family member/caregiver    Family Member Consulted Sister- [name]             Patient will benefit from skilled therapeutic intervention in order to improve the following deficits and impairments:  Abnormal gait, Decreased activity tolerance, Decreased balance, Decreased cognition, Decreased coordination, Decreased endurance, Decreased knowledge of use of DME, Decreased mobility, Decreased range of motion, Decreased safety awareness, Decreased strength, Difficulty walking, Hypomobility, Impaired perceived functional ability, Impaired flexibility, Impaired sensation, Impaired UE functional use  Visit Diagnosis: Unsteadiness on feet  Muscle weakness (generalized)  Difficulty in walking, not elsewhere classified     Problem List Patient Active Problem List   Diagnosis Date Noted   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 [place], [place], [address] Phone: [phone]   Fax:  [phone]  Name: Cruise Baumgardner MRN: [id number] Date of Birth: January 21, 1961

## 2020-11-27 ENCOUNTER — Ambulatory Visit: Payer: Medicaid Other | Admitting: Urology

## 2020-11-27 ENCOUNTER — Ambulatory Visit: Payer: Medicaid Other | Admitting: Physical Therapy

## 2020-11-29 ENCOUNTER — Ambulatory Visit: Payer: Medicaid Other | Admitting: Physical Therapy

## 2020-11-29 ENCOUNTER — Encounter: Payer: Medicaid Other | Admitting: Speech Pathology

## 2020-11-29 ENCOUNTER — Ambulatory Visit: Payer: Medicaid Other

## 2020-11-29 ENCOUNTER — Ambulatory Visit: Payer: Medicaid Other | Admitting: Speech Pathology

## 2020-12-04 ENCOUNTER — Ambulatory Visit: Payer: Medicaid Other | Admitting: Physical Therapy

## 2020-12-04 ENCOUNTER — Other Ambulatory Visit: Payer: Self-pay

## 2020-12-05 ENCOUNTER — Ambulatory Visit: Payer: Medicaid Other

## 2020-12-05 ENCOUNTER — Ambulatory Visit: Payer: Medicaid Other | Admitting: Speech Pathology

## 2020-12-06 ENCOUNTER — Encounter: Payer: Medicaid Other | Admitting: Speech Pathology

## 2020-12-06 ENCOUNTER — Ambulatory Visit: Payer: Medicaid Other

## 2020-12-06 ENCOUNTER — Ambulatory Visit: Payer: Medicaid Other | Admitting: Physical Therapy

## 2020-12-07 ENCOUNTER — Ambulatory Visit: Payer: Medicaid Other | Admitting: Speech Pathology

## 2020-12-07 ENCOUNTER — Other Ambulatory Visit: Payer: Self-pay

## 2020-12-07 DIAGNOSIS — R2681 Unsteadiness on feet: Secondary | ICD-10-CM | POA: Diagnosis not present

## 2020-12-07 DIAGNOSIS — R4701 Aphasia: Secondary | ICD-10-CM

## 2020-12-07 DIAGNOSIS — R482 Apraxia: Secondary | ICD-10-CM

## 2020-12-07 DIAGNOSIS — R41841 Cognitive communication deficit: Secondary | ICD-10-CM

## 2020-12-07 NOTE — Therapy (Signed)
[facility] MAIN [facility] SERVICES [address] [place], [place], [address] Phone: [phone]   Fax:  [phone]  Speech Language Pathology Treatment  Patient Details  Name: Jonathan Newman MRN: [id number] Date of Birth: [birth year] Referring Provider (SLP): Dr. Cristopher Peru   Encounter Date: 12/07/2020   End of Session - 12/07/20 1756     Visit Number 9    Number of Visits 15    Date for SLP Re-Evaluation 01/23/21    Authorization Type Medicaid - 27 visits combined for SLP/PT    Authorization - Visit Number 8    Authorization - Number of Visits 15    Progress Note Due on Visit 10    SLP Start Time 1500    SLP Stop Time  1600    SLP Time Calculation (min) 60 min    Activity Tolerance Patient tolerated treatment well             Past Medical History:  Diagnosis Date   Constipation    CVA (cerebral vascular accident) (HCC)    Diarrhea    Dysarthria and anarthria    Hemiplegia and hemiparesis following cerebral infarction affecting right dominant side (HCC)    Hyperlipidemia    Pain in right leg     No past surgical history on file.  There were no vitals filed for this visit.   Subjective Assessment - 12/07/20 1750     Subjective Pt missed several appointments due to having Covid-19    Patient is accompained by: Family member   sister Jonathan Newman   Currently in Pain? No/denies                   ADULT SLP TREATMENT - 12/07/20 1751       General Information   Behavior/Cognition Alert;Cooperative;Decreased sustained attention    HPI Jonathan Newman is a 60 y.o. male referred by Dr. Sherryll Burger for SLP evaluation. Hx noted for DVT, HTN Pt with reported CVA in February 2021 (records from OSH are unavailable), with subsequent rehabilitation in [place]. Pt now living with his sister in [place]. Repeat MRI brain on 09/29/20 showed "Large chronic left MCA territory and left MCA/PCA watershed territory cortical/subcortical infarct, as  described. Superimposed chronic perforator infarct within the left centrum semiovale/corona radiata and left basal ganglia. Associated wallerian degeneration on the left." Chronic infarcts also within left thalamus and bilateral pons, and mild generalized cerebral and cerebellar atrophy.      Cognitive-Linquistic Treatment   Treatment focused on Aphasia    Skilled Treatment Facilitated communication re: upcoming holiday with use of communication device. Pt wanted to add family members who are joining for holiday celebrations to his device. Added icons and demo'd for sister how to add photos to icons once family members present. Set up icon for Thanksgiving allowing pt to greet family members, explain his communication device, and request to take photo. Pt located "christmas" icon and indicated he wanted to discuss this as well; this was added to the folder at his request. Pt also accessed "friends" section and selected his friend Jonathan Newman, indicating he wanted to invite Jonathan Newman for Thanksgiving. Set up in "phone" section icons for pt to call and invite his friend. Pt made call to his friend, required initial mod cues to activate vs trying to "show" icons to the phone. Pt appeared pleased when his friend answered his requests.      Assessment / Recommendations / Plan   Plan Continue with current plan of care  Progression Toward Goals   Progression toward goals Progressing toward goals              SLP Education - 12/07/20 1756     Education Details add questions for him to ask family members; how to edit icons to add photos    Person(s) Educated Patient;Caregiver(s)    Methods Explanation;Demonstration;Verbal cues    Comprehension Verbalized understanding              SLP Short Term Goals - 10/25/20 1552       SLP SHORT TERM GOAL #1   Title Patient will comprehend simple Y/N questions >80% accuracy with written cues/ AAC support.    Baseline 60%    Time 5    Period Weeks     Status New      SLP SHORT TERM GOAL #2   Title Patient will indicate when he does not comprehend in >80% of trials (word to sentence level), using facial expression, gesture, or AAC.    Baseline 0%    Time 5    Period Weeks    Status New      SLP SHORT TERM GOAL #3   Title Patient will answer simple biographical questions 80% accuracy (Y/N questions or AAC).    Baseline <50%    Time 5    Period Weeks    Status New              SLP Long Term Goals - 10/25/20 1556       SLP LONG TERM GOAL #1   Title Pt will communicate emergency information 100% accuracy, using AAC if necessary.    Baseline 0%    Time 12    Period Weeks    Status New    Target Date 01/23/21      SLP LONG TERM GOAL #2   Title Patient will reduce frustration by communicating thoughts/feelings with AAC in 80% of opportunities.    Baseline can only use facial expressions    Time 12    Period Weeks    Status New    Target Date 01/23/21      SLP LONG TERM GOAL #3   Title Pt will successfully convey requests for outings/ personal needs outside of ST >75% of the time, using AAC if necessary.    Baseline 0%    Time 12    Period Weeks    Status New    Target Date 01/23/21      SLP LONG TERM GOAL #4   Title Pt will reduce social isolation risk by participating in simple conversational exchange (3-5 minutes), using AAC.    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 Visual cues (AAC icons) appear to increase pt's comprehension; pt demonstrating emerging ability to navigate communication device to communicate basic wants/needs. Pt used his device today to joke with his sister and to call and invite a friend for Thanksgiving. I recommend skilled  ST with focus on improving functional communication, trialing use of AAC to determine best way to support pt in communicating/comprehending.    Speech Therapy Frequency 2x / week    Duration 12  weeks   ST limited to 14 visits this year due to Medicaid visit limit (27 combined)   Treatment/Interventions Language facilitation;Environmental controls;Cueing hierarchy;SLP instruction and feedback;Compensatory techniques;Cognitive reorganization;Functional tasks;Compensatory strategies;Patient/family education;Internal/external aids;Multimodal communcation approach    Potential to Achieve Goals Good   with AAC   Potential Considerations Severity of impairments;Financial resources   has good family support, appears highly motivated   Consulted and Agree with Plan of Care Patient             Patient will benefit from skilled therapeutic intervention in order to improve the following deficits and impairments:   Aphasia  Verbal apraxia  Cognitive communication deficit    Problem List Patient Active Problem List   Diagnosis Date Noted   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

## 2020-12-11 ENCOUNTER — Other Ambulatory Visit: Payer: Self-pay

## 2020-12-11 ENCOUNTER — Ambulatory Visit: Payer: Medicaid Other | Admitting: Physical Therapy

## 2020-12-11 DIAGNOSIS — M6281 Muscle weakness (generalized): Secondary | ICD-10-CM

## 2020-12-11 DIAGNOSIS — R2681 Unsteadiness on feet: Secondary | ICD-10-CM

## 2020-12-11 DIAGNOSIS — R262 Difficulty in walking, not elsewhere classified: Secondary | ICD-10-CM

## 2020-12-11 NOTE — Therapy (Signed)
[facility] MAIN [facility] SERVICES [address] [place], [place], [address] Phone: [phone]   Fax:  [phone]  Physical Therapy Treatment  Patient Details  Name: Jonathan Newman MRN: [id number] Date of Birth: Nov 09, 1960 No data recorded  Encounter Date: 12/11/2020   PT End of Session - 12/11/20 1720     Visit Number 6    Number of Visits 25    Date for PT Re-Evaluation 01/17/21    Authorization Type Medicaid=Authorized combined PT/OT/SLP 27 visits    Authorization Time Period 9 authorized 10/31-12/31    Authorization - Visit Number 5    Authorization - Number of Visits 9    Progress Note Due on Visit 10    PT Start Time 1605    PT Stop Time 1645    PT Time Calculation (min) 40 min    Equipment Utilized During Treatment Gait belt    Activity Tolerance Patient tolerated treatment well    Behavior During Therapy Restless;Anxious;Impulsive             Past Medical History:  Diagnosis Date   Constipation    CVA (cerebral vascular accident) (HCC)    Diarrhea    Dysarthria and anarthria    Hemiplegia and hemiparesis following cerebral infarction affecting right dominant side (HCC)    Hyperlipidemia    Pain in right leg     No past surgical history on file.  There were no vitals filed for this visit.   Subjective Assessment - 12/11/20 1611     Subjective Pt sister 9darlene) reports they both had COVID. Reports significant weakness and cold like s/s. Reports for about 10 days it was very difficult but thery are feeling better now.    Pertinent History Patient is a 60 year old male with recent history of L MCA stroke in Feb 2021.Per Dr. 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 Had developed DVT afterwards. Patient is present with sister, Jonathan Newman  today who reports he has been living with her since May 2022 and uses his electric w/c in the home an has not walked since CVA- has manual w/c that he uses for community outings. She reports he requires assist with all ADLs and is impulsive but does require assist with transfers for safety- but states he does go to bathroom on his own at times.    Limitations Lifting;Standing;Walking;Writing;Reading;House hold activities    How long can you sit comfortably? no issues or limits    How long can you stand comfortably? <    How long can you walk comfortably? Has not walked since CVA    Patient Stated Goals Caregiver goal- to be as independent and do as much as he can on his own.              Treatment provided this session   Therex:    Nustep level  at seat 8, strap around legs to prevent ER of right hip.  -x  6 min -min A for transfer to/from WC to nustep     Leg Press: Required min A to transfer wheelchair<>Leg press machine and min A for positioning RLE over center of machine Required mod A for positioning LE on leg press: BLE 55# x10 RLE Only 25# 2x10 with therapist providing mod A for positioning hip into neutral position  to avoid hip ER to isolate quad activation;Mod/max A from PP to assist with moving weight    Seated weight shifting PT seated to right of patient and provided object to the right anterior rubber to improve weight shifting to the right lower extremity.  Right lower extremity had Airex pad beneath it in order to improve weight acceptance on the side.  PT also had patient extend the left knee so right lower extremity was only lower extremity on the floor excepting weight. PT seated to right side of patient for tactile cues for weight shift     Patient had significant difficulty shifting to RLE Therefore PT applied 2 inch board under LLE in order to unweight LLE and to also provide visual/tactile cue/target for keeping LLE in place for wide base of  support -Performed 5 sit to stand transfers with patient providing min assist to maintain right lower extremity positioning and with left lower extremity on 2 inch board. -Patient still tends to utilize left lower extremity as primary mover but did put some weight acceptance through right lower extremity with this task. -Could benefit from moderate assistance at the hips to improve weight shift to the right side next session   He was able to transfer mat table to wheelchair, close supervision with patient using LUE to pull self over to wheelchair   Patient reports increased fatigue at end of session.                        PT Education - 12/11/20 1719     Education Details Importance of utilization of right lower extremity for weight shifting and other tasks    Person(s) Educated Patient    Methods Explanation    Comprehension Need further instruction              PT Short Term Goals - 10/25/20 2336       PT SHORT TERM GOAL #1   Title Pt will be independent with HEP in order to improve strength and balance in order to decrease fall risk and improve function at home and work.    Baseline 10/25/2020= Patient has no formal HEP in place    Time 6    Period Weeks    Target Date 12/06/20      PT SHORT TERM GOAL #2   Title Patient will perform all sit to stand transfers with CGA for improved functional mobility and decreased dependence on caregivers.    Baseline 10/25/2020=Patient requires mod assist with all sit to stand transfers    Time 6    Period Weeks    Status New    Target Date 12/06/20               PT Long Term Goals - 10/25/20 2347       PT LONG TERM GOAL #1   Title Pt will improve FOTO to target score of 53  to display perceived improvements in ability to complete ADL's.    Baseline 10/25/2020= 29    Time 12    Period Weeks    Status New    Target Date 01/17/21      PT LONG TERM GOAL #2   Title Patient will perform stand pivot transfer  with supervision from all surfaces without loss of balance or VC for safety.    Baseline 10/25/2020- Patient requires Mod assist for safe transfers with min ability to place any weight through right LE.    Time  12    Period Weeks    Status New    Target Date 01/17/21      PT LONG TERM GOAL #3   Title Patient will perform static stand with least restrictive assistive device > 2 min with > 25% weight bearing through right LE without LOB.    Baseline 10/25/2020- Patient able to stand approx 30 sec yet did not place any weight through Right LE.    Time 12    Period Weeks    Status New    Target Date 01/17/21                   Plan - 12/11/20 1720     Clinical Impression Statement Patient demonstrated fair motivation to complete session but did complete all exercises as prescribed.  Patient required minimal assistance throughout session for transfers and positioning.  Instructed patient in further exercise to facilitate right lower extremity strengthening but with right lower extremity patient has significant difficulty performing any tasks and with leg press patient required mod to max assist for lower extremity stiffening as well as for pressing of the 25 pound minimal weight on leg press machine.  Patient still has difficulty with lower extremity Weight shift to right lower extremit and demonstrates significant reluctance to shift weight onto right lower extremity, however, patient does provide significant effort and attempts to shift weight to right lower extremity.  Patient will continue to benefit from skilled physical therapy intervention in order to improve his ambulatory capacity, safety with transfers, and improve burden per his caregiver.    Personal Factors and Comorbidities Comorbidity 3+;Time since onset of injury/illness/exacerbation    Comorbidities CVA, Aphasia, Hyperlipidemia    Examination-Activity Limitations Bathing;Bed Mobility;Bend;Caring for Others;Carry;Lift;Reach  Overhead;Squat;Stairs;Stand;Toileting;Transfers    Examination-Participation Restrictions Cleaning;Community Activity;Driving;Laundry;Medication Management;Meal Prep;Occupation;Personal Finances;Shop;Yard Work    Rehab Potential Fair    PT Frequency 1x / week    PT Duration 12 weeks    PT Treatment/Interventions ADLs/Self Care Home Management;Aquatic Therapy;Cryotherapy;Electrical Stimulation;Moist Heat;Ultrasound;DME Instruction;Gait training;Stair training;Functional mobility training;Therapeutic activities;Therapeutic exercise;Balance training;Neuromuscular re-education;Patient/family education;Wheelchair mobility training;Manual techniques;Passive range of motion;Dry needling;Splinting;Orthotic Fit/Training;Taping    PT Next Visit Plan Instruct in LE Strengthening for Home program    PT Home Exercise Plan To be initiated next visit    Consulted and Agree with Plan of Care Patient;Family member/caregiver    Family Member Consulted Sister- [name]             Patient will benefit from skilled therapeutic intervention in order to improve the following deficits and impairments:  Abnormal gait, Decreased activity tolerance, Decreased balance, Decreased cognition, Decreased coordination, Decreased endurance, Decreased knowledge of use of DME, Decreased mobility, Decreased range of motion, Decreased safety awareness, Decreased strength, Difficulty walking, Hypomobility, Impaired perceived functional ability, Impaired flexibility, Impaired sensation, Impaired UE functional use  Visit Diagnosis: Unsteadiness on feet  Difficulty in walking, not elsewhere classified  Muscle weakness (generalized)     Problem List Patient Active Problem List   Diagnosis Date Noted   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 [place], [place], [address] Phone: [phone]   Fax:  [phone]  Name: Wessley Emert MRN: [id number] Date of Birth: 06-Mar-1960

## 2020-12-12 ENCOUNTER — Ambulatory Visit: Payer: Medicaid Other | Admitting: Speech Pathology

## 2020-12-13 ENCOUNTER — Ambulatory Visit: Payer: Medicaid Other

## 2020-12-13 ENCOUNTER — Ambulatory Visit: Payer: Medicaid Other | Admitting: Physical Therapy

## 2020-12-13 ENCOUNTER — Other Ambulatory Visit: Payer: Self-pay

## 2020-12-13 ENCOUNTER — Encounter: Payer: Medicaid Other | Admitting: Speech Pathology

## 2020-12-13 ENCOUNTER — Ambulatory Visit: Payer: Medicaid Other | Admitting: Speech Pathology

## 2020-12-13 DIAGNOSIS — R2681 Unsteadiness on feet: Secondary | ICD-10-CM

## 2020-12-13 DIAGNOSIS — R262 Difficulty in walking, not elsewhere classified: Secondary | ICD-10-CM

## 2020-12-13 DIAGNOSIS — M6281 Muscle weakness (generalized): Secondary | ICD-10-CM

## 2020-12-13 DIAGNOSIS — R482 Apraxia: Secondary | ICD-10-CM

## 2020-12-13 DIAGNOSIS — R4701 Aphasia: Secondary | ICD-10-CM

## 2020-12-13 DIAGNOSIS — R41841 Cognitive communication deficit: Secondary | ICD-10-CM

## 2020-12-13 NOTE — Therapy (Signed)
[place] MAIN [facility] SERVICES [address] [place], [place], [address] Phone: [phone]   Fax:  [phone]  Speech Language Pathology Treatment and Progress Note  Patient Details  Name: Jonathan Newman MRN: [id number] Date of Birth: 01-30-1960 Referring Provider (SLP): Dr. Jennings Books  Speech Therapy Progress Note  Dates of Reporting Period: 10/25/2020 to 12/13/2020  Objective: Patient has been seen for 10 speech therapy sessions this reporting period targeting aphasia and alternative/augmentative communication. Patient is making progress toward LTGs and fully met 1/3 and partially met 2/3 STGs this reporting period. See skilled intervention, clinical impressions, and goals below for details.  Encounter Date: 12/13/2020   End of Session - 12/13/20 1626     Visit Number 10    Number of Visits 15    Date for SLP Re-Evaluation 01/23/21    Authorization Type Medicaid - 27 visits combined for SLP/PT    Authorization - Visit Number 9    Authorization - Number of Visits 15    Progress Note Due on Visit 10    SLP Start Time 1500    SLP Stop Time  1600    SLP Time Calculation (min) 60 min    Activity Tolerance Patient tolerated treatment well             Past Medical History:  Diagnosis Date   Constipation    CVA (cerebral vascular accident) ([place])    Diarrhea    Dysarthria and anarthria    Hemiplegia and hemiparesis following cerebral infarction affecting right dominant side (HCC)    Hyperlipidemia    Pain in right leg     No past surgical history on file.  There were no vitals filed for this visit.   Subjective Assessment - 12/13/20 1614     Subjective Patient accidentally restored an old back-up of his device    Currently in Pain? No/denies                   ADULT SLP TREATMENT - 12/13/20 1620       General Information   Behavior/Cognition Alert;Cooperative;Decreased sustained attention    HPI Jonathan Newman is a  60 y.o. male referred by Dr. Manuella Ghazi for SLP evaluation. Hx noted for DVT, HTN Pt with reported CVA in February 2021 (records from OSH are unavailable), with subsequent rehabilitation in [place]. Pt now living with his sister in [place]. Repeat MRI brain on 09/29/20 showed "Large chronic left MCA territory and left MCA/PCA watershed territory cortical/subcortical infarct, as described. Superimposed chronic perforator infarct within the left centrum semiovale/corona radiata and left basal ganglia. Associated wallerian degeneration on the left." Chronic infarcts also within left thalamus and bilateral pons, and mild generalized cerebral and cerebellar atrophy.      Cognitive-Linquistic Treatment   Treatment focused on Aphasia    Skilled Treatment Patient accidentally deleted some of the customization from his device. Discussed options such as limiting pt access to edit functions; will request this modification on pt's personal device. Facilitated conversation re: pt's upcoming holiday and foods. Pt indicated preferences, with occasional min-mod cues to reduce impulsity and instruction on using "back" button vs home button to limit distraction. Pt returned demonstration. Discussed wheelchair mounting for pt's device; they would like mount for his motorized chair.      Assessment / Recommendations / Plan   Plan Continue with current plan of care      Progression Toward Goals   Progression toward goals Progressing toward goals  SLP Short Term Goals - 10/25/20 1552       SLP SHORT TERM GOAL #1   Title Patient will comprehend simple Y/N questions >80% accuracy with written cues/ AAC support.    Baseline 60%    Time 5    Period Weeks    Status New      SLP SHORT TERM GOAL #2   Title Patient will indicate when he does not comprehend in >80% of trials (word to sentence level), using facial expression, gesture, or AAC.    Baseline 0%    Time 5    Period Weeks    Status New       SLP SHORT TERM GOAL #3   Title Patient will answer simple biographical questions 80% accuracy (Y/N questions or AAC).    Baseline <50%    Time 5    Period Weeks    Status New              SLP Long Term Goals - 10/25/20 1556       SLP LONG TERM GOAL #1   Title Pt will communicate emergency information 100% accuracy, using AAC if necessary.    Baseline 0%    Time 12    Period Weeks    Status New    Target Date 01/23/21      SLP LONG TERM GOAL #2   Title Patient will reduce frustration by communicating thoughts/feelings with AAC in 80% of opportunities.    Baseline can only use facial expressions    Time 12    Period Weeks    Status New    Target Date 01/23/21      SLP LONG TERM GOAL #3   Title Pt will successfully convey requests for outings/ personal needs outside of ST >75% of the time, using AAC if necessary.    Baseline 0%    Time 12    Period Weeks    Status New    Target Date 01/23/21      SLP LONG TERM GOAL #4   Title Pt will reduce social isolation risk by participating in simple conversational exchange (3-5 minutes), using AAC.    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 Visual cues (AAC icons) appear to increase pt's comprehension; pt demonstrating emerging ability to navigate communication device to communicate basic wants/needs. Pt has used his device to communicate food choices consistently, as well as to make jokes and relay feelings/discomfort. Patient has exhibited reduced frustration in sessions by increasing communication abilities during AAC device trial; patient and sister wish to proceed with obtaining permanent [employer] device, after trialing lower tech options and phone  applications. l I recommend skilled ST with focus on improving functional communication, trialing use of AAC to determine best way to support pt in communicating/comprehending.    Speech Therapy Frequency 2x / week    Duration 12 weeks   ST limited to 14 visits this year due to Medicaid visit limit (27 combined)   Treatment/Interventions Language facilitation;Environmental controls;Cueing hierarchy;SLP instruction and feedback;Compensatory techniques;Cognitive reorganization;Functional tasks;Compensatory strategies;Patient/family education;Internal/external aids;Multimodal communcation approach    Potential to Achieve Goals Good   with  AAC   Potential Considerations Severity of impairments;Financial resources   has good family support, appears highly motivated   Consulted and Agree with Plan of Care Patient             Patient will benefit from skilled therapeutic intervention in order to improve the following deficits and impairments:   Aphasia  Verbal apraxia  Cognitive communication deficit    Problem List Patient Active Problem List   Diagnosis Date Noted   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

## 2020-12-13 NOTE — Therapy (Signed)
[facility] MAIN [facility] SERVICES [address] [place], [place], [address] Phone: [phone]   Fax:  [phone]  Physical Therapy Treatment  Patient Details  Name: Jonathan Newman MRN: [id number] Date of Birth: [birth year] No data recorded  Encounter Date: 12/13/2020   PT End of Session - 12/13/20 1507     Visit Number 7    Number of Visits 25    Date for PT Re-Evaluation 01/17/21    Authorization Type Medicaid=Authorized combined PT/OT/SLP 27 visits    Authorization Time Period 9 authorized 10/31-12/31    Authorization - Visit Number 6    Authorization - Number of Visits 9    Progress Note Due on Visit 10    PT Start Time 1355    PT Stop Time 1433    PT Time Calculation (min) 38 min    Equipment Utilized During Treatment Gait belt    Activity Tolerance Patient tolerated treatment well    Behavior During Therapy Restless;Anxious;Impulsive             Past Medical History:  Diagnosis Date   Constipation    CVA (cerebral vascular accident) (HCC)    Diarrhea    Dysarthria and anarthria    Hemiplegia and hemiparesis following cerebral infarction affecting right dominant side (HCC)    Hyperlipidemia    Pain in right leg     History reviewed. No pertinent surgical history.  There were no vitals filed for this visit.   Subjective Assessment - 12/13/20 1505     Subjective Patient presents with sister late to PT session. Reports he has been getting around well. Having R arm numbness and tingling    Pertinent History Patient is a 60 year old male with recent history of L MCA stroke in Feb 2021.Per Dr. 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 Had developed DVT afterwards. Patient is present with sister, Jonathan Newman today who reports he has been living  with her since May 2022 and uses his electric w/c in the home an has not walked since CVA- has manual w/c that he uses for community outings. She reports he requires assist with all ADLs and is impulsive but does require assist with transfers for safety- but states he does go to bathroom on his own at times.    Limitations Lifting;Standing;Walking;Writing;Reading;House hold activities    How long can you sit comfortably? no issues or limits    How long can you stand comfortably? <    How long can you walk comfortably? Has not walked since CVA    Patient Stated Goals Caregiver goal- to be as independent and do as much as he can on his own.    Currently in Pain? Yes    Pain Score --   unable to grade   Pain Location Arm    Pain Orientation Right    Pain Descriptors / Indicators Aching    Pain Type Chronic pain    Pain Onset In the past 7 days    Pain Frequency Intermittent                   Treatment: On mat table; mirror in front for additional visual cue -lateral seated weight shift onto RUE/elbow 15x with improved shift each attempt -lateral seated weight shift reaching to SPT hand for cross  body rotation plus shifting onto RLE x 3 minutes in varying arc of motion -sit to stand with different items under LLE: unsuccessful with 6" step and dynadisc: successful with airex pad.  -sit to stand with airex pad under LLE x2 person assistance, cueing for weight shift with mod tactile assistance for weightbearing through RLE 10x; 2 sets  -static stand with airex pad under LLE x2 person assistance with mod assistance for weight acceptance through LLE x 48 seconds -standing weight shifts with mod a x 2 for "booty bumps"  -RLE hip flexion 10x to PT hand for visual cue -reflexive RLE LAQ kicking soccer ball x 15 kicks  -sitting on dynadisc; lateral weight shifts x 30 seconds   Pt educated throughout session about proper posture and technique with exercises. Improved exercise  technique, movement at target joints, use of target muscles after min to mod verbal, visual, tactile cues.     Patient is highly motivated throughout session and is able to accept weight through RLE in standing position with mod A x 2 person assist. He additionally has triceps activation of affected RUE with assistance pushing back to midline from lateral side bend weight shift indicating improved strength and brain body connection. His RLE does have reaction timing recruitment patterns despite limited intentional muscle recruitment patterning. Patient will continue to benefit from skilled physical therapy intervention in order to improve his ambulatory capacity, safety with transfers, and improve burden per his caregiver.                  PT Education - 12/13/20 1507     Education Details weight shift, RLE body mechanics    Person(s) Educated Patient    Methods Explanation;Demonstration;Tactile cues;Verbal cues    Comprehension Verbalized understanding;Other (comment);Returned demonstration;Verbal cues required;Tactile cues required              PT Short Term Goals - 10/25/20 2336       PT SHORT TERM GOAL #1   Title Pt will be independent with HEP in order to improve strength and balance in order to decrease fall risk and improve function at home and work.    Baseline 10/25/2020= Patient has no formal HEP in place    Time 6    Period Weeks    Target Date 12/06/20      PT SHORT TERM GOAL #2   Title Patient will perform all sit to stand transfers with CGA for improved functional mobility and decreased dependence on caregivers.    Baseline 10/25/2020=Patient requires mod assist with all sit to stand transfers    Time 6    Period Weeks    Status New    Target Date 12/06/20               PT Long Term Goals - 10/25/20 2347       PT LONG TERM GOAL #1   Title Pt will improve FOTO to target score of 53  to display perceived improvements in ability to complete  ADL's.    Baseline 10/25/2020= 29    Time 12    Period Weeks    Status New    Target Date 01/17/21      PT LONG TERM GOAL #2   Title Patient will perform stand pivot transfer with supervision from all surfaces without loss of balance or VC for safety.    Baseline 10/25/2020- Patient requires Mod assist for safe transfers with min ability to place any weight through right LE.  Time 12    Period Weeks    Status New    Target Date 01/17/21      PT LONG TERM GOAL #3   Title Patient will perform static stand with least restrictive assistive device > 2 min with > 25% weight bearing through right LE without LOB.    Baseline 10/25/2020- Patient able to stand approx 30 sec yet did not place any weight through Right LE.    Time 12    Period Weeks    Status New    Target Date 01/17/21                   Plan - 12/13/20 1517     Clinical Impression Statement Patient is highly motivated throughout session and is able to accept weight through RLE in standing position with mod A x 2 person assist. He additionally has triceps activation of affected RUE with assistance pushing back to midline from lateral side bend weight shift indicating improved strength and brain body connection. His RLE does have reaction timing recruitment patterns despite limited intentional muscle recruitment patterning. Patient will continue to benefit from skilled physical therapy intervention in order to improve his ambulatory capacity, safety with transfers, and improve burden per his caregiver.    Personal Factors and Comorbidities Comorbidity 3+;Time since onset of injury/illness/exacerbation    Comorbidities CVA, Aphasia, Hyperlipidemia    Examination-Activity Limitations Bathing;Bed Mobility;Bend;Caring for Others;Carry;Lift;Reach Overhead;Squat;Stairs;Stand;Toileting;Transfers    Examination-Participation Restrictions Cleaning;Community Activity;Driving;Laundry;Medication Management;Meal Prep;Occupation;Personal  Finances;Shop;Yard Work    Rehab Potential Fair    PT Frequency 1x / week    PT Duration 12 weeks    PT Treatment/Interventions ADLs/Self Care Home Management;Aquatic Therapy;Cryotherapy;Electrical Stimulation;Moist Heat;Ultrasound;DME Instruction;Gait training;Stair training;Functional mobility training;Therapeutic activities;Therapeutic exercise;Balance training;Neuromuscular re-education;Patient/family education;Wheelchair mobility training;Manual techniques;Passive range of motion;Dry needling;Splinting;Orthotic Fit/Training;Taping    PT Next Visit Plan Instruct in LE Strengthening for Home program    PT Home Exercise Plan To be initiated next visit    Consulted and Agree with Plan of Care Patient;Family member/caregiver    Family Member Consulted Sister- [name]             Patient will benefit from skilled therapeutic intervention in order to improve the following deficits and impairments:  Abnormal gait, Decreased activity tolerance, Decreased balance, Decreased cognition, Decreased coordination, Decreased endurance, Decreased knowledge of use of DME, Decreased mobility, Decreased range of motion, Decreased safety awareness, Decreased strength, Difficulty walking, Hypomobility, Impaired perceived functional ability, Impaired flexibility, Impaired sensation, Impaired UE functional use  Visit Diagnosis: Unsteadiness on feet  Difficulty in walking, not elsewhere classified  Muscle weakness (generalized)     Problem List Patient Active Problem List   Diagnosis Date Noted   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 [place], [place], [address] Phone: [phone]   Fax:  [phone]  Name: Savalas Monje MRN: [id number] Date of Birth: Feb 15, 1960

## 2020-12-18 ENCOUNTER — Other Ambulatory Visit: Payer: Self-pay

## 2020-12-18 ENCOUNTER — Ambulatory Visit: Payer: Medicaid Other | Admitting: Physical Therapy

## 2020-12-18 ENCOUNTER — Ambulatory Visit: Payer: Medicaid Other

## 2020-12-18 DIAGNOSIS — M6281 Muscle weakness (generalized): Secondary | ICD-10-CM

## 2020-12-18 DIAGNOSIS — R2681 Unsteadiness on feet: Secondary | ICD-10-CM

## 2020-12-18 DIAGNOSIS — R2689 Other abnormalities of gait and mobility: Secondary | ICD-10-CM

## 2020-12-18 DIAGNOSIS — R278 Other lack of coordination: Secondary | ICD-10-CM

## 2020-12-18 NOTE — Therapy (Signed)
[facility] MAIN [facility] SERVICES [address] [place], [place], [address] Phone: [phone]   Fax:  [phone]  Physical Therapy Treatment  Patient Details  Name: Jonathan Newman MRN: [id number] Date of Birth: 07-07-1960 No data recorded  Encounter Date: 12/18/2020   PT End of Session - 12/18/20 1713     Visit Number 8    Number of Visits 25    Date for PT Re-Evaluation 01/17/21    Authorization Type Medicaid=Authorized combined PT/OT/SLP 27 visits    Authorization Time Period 9 authorized 10/31-12/31    Authorization - Visit Number 6    Authorization - Number of Visits 9    Progress Note Due on Visit 10    PT Start Time 1435    PT Stop Time 1515    PT Time Calculation (min) 40 min    Equipment Utilized During Treatment Gait belt    Activity Tolerance Patient tolerated treatment well    Behavior During Therapy Impulsive;WFL for tasks assessed/performed             Past Medical History:  Diagnosis Date   Constipation    CVA (cerebral vascular accident) (HCC)    Diarrhea    Dysarthria and anarthria    Hemiplegia and hemiparesis following cerebral infarction affecting right dominant side (HCC)    Hyperlipidemia    Pain in right leg     History reviewed. No pertinent surgical history.  There were no vitals filed for this visit.   Subjective Assessment - 12/18/20 1438     Subjective Pt reports no pain or falls/LOB. Pt sister with pt.    Patient is accompained by: Family member   Sister Jonathan Newman   Pertinent History Patient is a 60 year old male with recent history of L MCA stroke in Feb 2021.Per Dr. 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 Had developed DVT afterwards. Patient is present with sister, Jonathan Newman today who reports he has been  living with her since May 2022 and uses his electric w/c in the home an has not walked since CVA- has manual w/c that he uses for community outings. She reports he requires assist with all ADLs and is impulsive but does require assist with transfers for safety- but states he does go to bathroom on his own at times.    Limitations Lifting;Standing;Walking;Writing;Reading;House hold activities    How long can you sit comfortably? no issues or limits    How long can you stand comfortably? <    How long can you walk comfortably? Has not walked since CVA    Patient Stated Goals Caregiver goal- to be as independent and do as much as he can on his own.    Currently in Pain? No/denies    Pain Onset In the past 7 days             INTERVENTIONS -   On mat table; continued use of mirror in front for additional visual cue -lateral seated weight shift onto RUE/elbow 2 rounds of multiple repetitions. Both PT with assistance of SPT providing Tc'ing to RUE to promote activation of triceps and positioning of hand.   Sit to stand with airex pad under LLE x2 person assistance, cueing for weight shift with multi-modal cuing to promote weightbearing through RLE 10x; 2 sets  --Pt with  difficulty with airex pad, even with SPT assisting pt R heel onto ground.  PT replaced blue airex pad with green smaller pad. Pt continued to require some assist for LLE positioning. PT and SPT also assist pt in placing RUE onto RW in front of pt to promote sensory cuing from RUE.  Pt performs the following while seated on dynadisc, PT providing close CGA Seated, upright posture, no UE support, EO 2x30 sec. Pt exhibits some sway but when asked if "easy" pt nods to indicate yet. Seated, upright posture, no UE support, EC 2x30 sec. Pt continues to exhibit sway, struggles to keep EC in order to maintain balance.  --progressed to performing with EC, vertical and horizontal head turns 10x each.  Seated hip flexion march 2x10 each  LE. Extensive use of TC/hands-on assist for correct technique.   Seated hip abduction with green airex pad (pt using RLE to kick airex pad to side) x multiple reps with hands-on assist. Pt 2x independently moved RLE to kick pad.   Standing "booty bump" weight shifts 5x with up to min assist. UUE support on RW.    Pt educated throughout session about proper posture and technique with exercises. Improved exercise technique, movement at target joints, use of target muscles after min to mod verbal, visual, tactile cues.    PT Education - 12/18/20 1712     Education Details continue instruction on weightshift over RLE, body mechanics    Person(s) Educated Patient    Methods Explanation;Demonstration;Tactile cues;Verbal cues    Comprehension Verbalized understanding;Returned demonstration;Verbal cues required;Need further instruction;Tactile cues required              PT Short Term Goals - 10/25/20 2336       PT SHORT TERM GOAL #1   Title Pt will be independent with HEP in order to improve strength and balance in order to decrease fall risk and improve function at home and work.    Baseline 10/25/2020= Patient has no formal HEP in place    Time 6    Period Weeks    Target Date 12/06/20      PT SHORT TERM GOAL #2   Title Patient will perform all sit to stand transfers with CGA for improved functional mobility and decreased dependence on caregivers.    Baseline 10/25/2020=Patient requires mod assist with all sit to stand transfers    Time 6    Period Weeks    Status New    Target Date 12/06/20               PT Long Term Goals - 10/25/20 2347       PT LONG TERM GOAL #1   Title Pt will improve FOTO to target score of 53  to display perceived improvements in ability to complete ADL's.    Baseline 10/25/2020= 29    Time 12    Period Weeks    Status New    Target Date 01/17/21      PT LONG TERM GOAL #2   Title Patient will perform stand pivot transfer with supervision from  all surfaces without loss of balance or VC for safety.    Baseline 10/25/2020- Patient requires Mod assist for safe transfers with min ability to place any weight through right LE.    Time 12    Period Weeks    Status New    Target Date 01/17/21      PT LONG TERM GOAL #3   Title Patient will perform  static stand with least restrictive assistive device > 2 min with > 25% weight bearing through right LE without LOB.    Baseline 10/25/2020- Patient able to stand approx 30 sec yet did not place any weight through Right LE.    Time 12    Period Weeks    Status New    Target Date 01/17/21                   Plan - 12/18/20 1725     Clinical Impression Statement Pt continues to be highly motivated throughout session and responds well to interventions without reports of pain or excessive fatigue. Author continues focus on weightshiting to RLE to promote weightbearing through RLE. Additionally, pt places RUE lightly on RW to promote sensory feedback through RUE for balance with standing. Pt had some difficulty placing R heel all the way on the floor requiring hands-on assist. The pt will benefit from further skilled PT to improve ambulatory capacity, safety with transfers, and strength and mobility to increase ease with ADLs.    Personal Factors and Comorbidities Comorbidity 3+;Time since onset of injury/illness/exacerbation    Comorbidities CVA, Aphasia, Hyperlipidemia    Examination-Activity Limitations Bathing;Bed Mobility;Bend;Caring for Others;Carry;Lift;Reach Overhead;Squat;Stairs;Stand;Toileting;Transfers    Examination-Participation Restrictions Cleaning;Community Activity;Driving;Laundry;Medication Management;Meal Prep;Occupation;Personal Finances;Shop;Yard Work    Rehab Potential Fair    PT Frequency 1x / week    PT Duration 12 weeks    PT Treatment/Interventions ADLs/Self Care Home Management;Aquatic Therapy;Cryotherapy;Electrical Stimulation;Moist Heat;Ultrasound;DME  Instruction;Gait training;Stair training;Functional mobility training;Therapeutic activities;Therapeutic exercise;Balance training;Neuromuscular re-education;Patient/family education;Wheelchair mobility training;Manual techniques;Passive range of motion;Dry needling;Splinting;Orthotic Fit/Training;Taping    PT Next Visit Plan Instruct in LE Strengthening for Home program, continue POC as previously indicated, balance, strength    PT Home Exercise Plan no updates    Consulted and Agree with Plan of Care Patient;Family member/caregiver    Family Member Consulted Sister- Jonathan Newman             Patient will benefit from skilled therapeutic intervention in order to improve the following deficits and impairments:  Abnormal gait, Decreased activity tolerance, Decreased balance, Decreased cognition, Decreased coordination, Decreased endurance, Decreased knowledge of use of DME, Decreased mobility, Decreased range of motion, Decreased safety awareness, Decreased strength, Difficulty walking, Hypomobility, Impaired perceived functional ability, Impaired flexibility, Impaired sensation, Impaired UE functional use  Visit Diagnosis: Muscle weakness (generalized)  Other abnormalities of gait and mobility  Other lack of coordination  Unsteadiness on feet     Problem List Patient Active Problem List   Diagnosis Date Noted   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 [place], [place], [address] Phone: [phone]   Fax:  [phone]  Name: Jonathan Newman MRN: [id number] Date of Birth: August 20, 1960

## 2020-12-19 ENCOUNTER — Ambulatory Visit: Payer: Medicaid Other | Admitting: Speech Pathology

## 2020-12-20 ENCOUNTER — Encounter: Payer: Medicaid Other | Admitting: Speech Pathology

## 2020-12-20 ENCOUNTER — Ambulatory Visit: Payer: Medicaid Other

## 2020-12-20 ENCOUNTER — Ambulatory Visit: Payer: Medicaid Other | Admitting: Physical Therapy

## 2020-12-21 ENCOUNTER — Encounter: Payer: Medicaid Other | Admitting: Speech Pathology

## 2020-12-25 ENCOUNTER — Other Ambulatory Visit: Payer: Self-pay

## 2020-12-25 ENCOUNTER — Encounter: Payer: Medicaid Other | Admitting: Speech Pathology

## 2020-12-25 ENCOUNTER — Ambulatory Visit: Payer: Medicaid Other | Attending: Family Medicine

## 2020-12-25 DIAGNOSIS — R278 Other lack of coordination: Secondary | ICD-10-CM | POA: Diagnosis present

## 2020-12-25 DIAGNOSIS — R2689 Other abnormalities of gait and mobility: Secondary | ICD-10-CM | POA: Diagnosis present

## 2020-12-25 DIAGNOSIS — R4701 Aphasia: Secondary | ICD-10-CM | POA: Diagnosis present

## 2020-12-25 DIAGNOSIS — M6281 Muscle weakness (generalized): Secondary | ICD-10-CM | POA: Diagnosis present

## 2020-12-25 DIAGNOSIS — R482 Apraxia: Secondary | ICD-10-CM | POA: Insufficient documentation

## 2020-12-25 DIAGNOSIS — R262 Difficulty in walking, not elsewhere classified: Secondary | ICD-10-CM | POA: Diagnosis present

## 2020-12-25 DIAGNOSIS — R41841 Cognitive communication deficit: Secondary | ICD-10-CM | POA: Insufficient documentation

## 2020-12-25 DIAGNOSIS — R2681 Unsteadiness on feet: Secondary | ICD-10-CM

## 2020-12-25 NOTE — Therapy (Signed)
[facility] MAIN [facility] SERVICES [address] [place], [place], [address] Phone: [phone]   Fax:  [phone]  Physical Therapy Treatment  Patient Details  Name: Jonathan Newman MRN: [id number] Date of Birth: 02/22/1960 No data recorded  Encounter Date: 12/25/2020   PT End of Session - 12/25/20 1709     Visit Number 9    Number of Visits 25    Date for PT Re-Evaluation 01/17/21    Authorization Type Medicaid=Authorized combined PT/OT/SLP 27 visits    Authorization Time Period 9 authorized 10/31-12/31    Authorization - Visit Number 6    Authorization - Number of Visits 9    Progress Note Due on Visit 10    PT Start Time 1609    PT Stop Time 1651    PT Time Calculation (min) 42 min    Equipment Utilized During Treatment Gait belt    Activity Tolerance Patient tolerated treatment well    Behavior During Therapy Impulsive;WFL for tasks assessed/performed             Past Medical History:  Diagnosis Date   Constipation    CVA (cerebral vascular accident) (HCC)    Diarrhea    Dysarthria and anarthria    Hemiplegia and hemiparesis following cerebral infarction affecting right dominant side (HCC)    Hyperlipidemia    Pain in right leg     History reviewed. No pertinent surgical history.  There were no vitals filed for this visit.   Subjective Assessment - 12/25/20 1612     Subjective Pt sister present for appointment. Pt reports no falls or near-falls. Pt shakes his head to indicate no pain currently when asked.    Patient is accompained by: Family member   Sister Jonathan Newman   Pertinent History Patient is a 60 year old male with recent history of L MCA stroke in Feb 2021.Per Dr. 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 Had developed DVT  afterwards. Patient is present with sister, Jonathan Newman today who reports he has been living with her since May 2022 and uses his electric w/c in the home an has not walked since CVA- has manual w/c that he uses for community outings. She reports he requires assist with all ADLs and is impulsive but does require assist with transfers for safety- but states he does go to bathroom on his own at times.    Limitations Lifting;Standing;Walking;Writing;Reading;House hold activities    How long can you sit comfortably? no issues or limits    How long can you stand comfortably? <    How long can you walk comfortably? Has not walked since CVA    Patient Stated Goals Caregiver goal- to be as independent and do as much as he can on his own.    Currently in Pain? No/denies    Pain Onset In the past 7 days              INTERVENTIONS -   Seated marches 2x20 alternating, TC for improved LLE AROM.   Leg Press: Required min A to transfer wheelchair<>Leg press machine and min A for positioning RLE over center of machine Required mod A for positioning RLE on leg press to sustain R hip adduction throughout: 40# 2x10, SPT assists and provides TC to RLE for positioning.  55# 1x10 BLE  58# 1x10 BLE  Seated pball adduction isometric with assisted hip IR - multiple attempts to add hip IR, pt with difficulty coordinating movement.   Seated pball hip adduction 2x10 with 3 sec holds; pt rates as medium. Frequent VC/TC cuing for improved activation of RLE adductors   RLE hip IR with cuing pt to kick 1.5# AW laterally - pt with difficulty/unable due to RLE tone after multiple attempts and hands-on assist   STS 1x to [facility] with CGA 2x. Pt difficulty placing R heel on floor and confirms RLE tightness sensation when asked.  6" step assisted calf stretch (pt seated) 4x30 sec, addition of hands-on assist as well to deepen stretch  STS 5x with lateral rocks (R and L) with 20-30 sec holds over RLE     Pt educated  throughout session about proper posture and technique with exercises. Improved exercise technique, movement at target joints, use of target muscles after min to mod verbal, visual, tactile cues.     PT Education - 12/25/20 1708     Education Details exercise technique, body mechanics    Person(s) Educated Patient;Caregiver(s)    Methods Explanation;Demonstration;Tactile cues;Verbal cues    Comprehension Verbalized understanding;Returned demonstration;Tactile cues required;Verbal cues required;Need further instruction              PT Short Term Goals - 10/25/20 2336       PT SHORT TERM GOAL #1   Title Pt will be independent with HEP in order to improve strength and balance in order to decrease fall risk and improve function at home and work.    Baseline 10/25/2020= Patient has no formal HEP in place    Time 6    Period Weeks    Target Date 12/06/20      PT SHORT TERM GOAL #2   Title Patient will perform all sit to stand transfers with CGA for improved functional mobility and decreased dependence on caregivers.    Baseline 10/25/2020=Patient requires mod assist with all sit to stand transfers    Time 6    Period Weeks    Status New    Target Date 12/06/20               PT Long Term Goals - 10/25/20 2347       PT LONG TERM GOAL #1   Title Pt will improve FOTO to target score of 53  to display perceived improvements in ability to complete ADL's.    Baseline 10/25/2020= 29    Time 12    Period Weeks    Status New    Target Date 01/17/21      PT LONG TERM GOAL #2   Title Patient will perform stand pivot transfer with supervision from all surfaces without loss of balance or VC for safety.    Baseline 10/25/2020- Patient requires Mod assist for safe transfers with min ability to place any weight through right LE.    Time 12    Period Weeks    Status New    Target Date 01/17/21      PT LONG TERM GOAL #3   Title Patient will perform static stand with least restrictive  assistive device > 2 min with > 25% weight bearing through right LE without LOB.    Baseline 10/25/2020- Patient able to stand approx 30 sec yet did not place any weight through Right LE.    Time 12    Period Weeks    Status New    Target  Date 01/17/21                   Plan - 12/25/20 1709     Clinical Impression Statement Pt arrives with excellent motivation to participate in session and tolerates all interventions well without excessive fatigue and without pain. Pt able to progress level of resistance on leg press, indicating improved LE strength. Pt did have some difficulty maintaining correct positioning of RLE, requiring hands-on assist from SPT, due to decreased R hip adductor and R hip IR strength as well as increased muscle stiffness of RLE. These interventions were followed up with RLE stretching. Pt then was able to perform multiple STS with improved weightshift of RLE with heel contact on floor. The pt will benefit from further skilled PT to improve ambulatory capacity, transfers, and strength to increase ease and safety with ADLs.    Personal Factors and Comorbidities Comorbidity 3+;Time since onset of injury/illness/exacerbation    Comorbidities CVA, Aphasia, Hyperlipidemia    Examination-Activity Limitations Bathing;Bed Mobility;Bend;Caring for Others;Carry;Lift;Reach Overhead;Squat;Stairs;Stand;Toileting;Transfers    Examination-Participation Restrictions Cleaning;Community Activity;Driving;Laundry;Medication Management;Meal Prep;Occupation;Personal Finances;Shop;Yard Work    Rehab Potential Fair    PT Frequency 1x / week    PT Duration 12 weeks    PT Treatment/Interventions ADLs/Self Care Home Management;Aquatic Therapy;Cryotherapy;Electrical Stimulation;Moist Heat;Ultrasound;DME Instruction;Gait training;Stair training;Functional mobility training;Therapeutic activities;Therapeutic exercise;Balance training;Neuromuscular re-education;Patient/family education;Wheelchair  mobility training;Manual techniques;Passive range of motion;Dry needling;Splinting;Orthotic Fit/Training;Taping    PT Next Visit Plan Instruct in LE Strengthening for Home program, continue POC as previously indicated, balance, strength    PT Home Exercise Plan no updates    Consulted and Agree with Plan of Care Patient;Family member/caregiver    Family Member Consulted Sister- Jonathan Newman             Patient will benefit from skilled therapeutic intervention in order to improve the following deficits and impairments:  Abnormal gait, Decreased activity tolerance, Decreased balance, Decreased cognition, Decreased coordination, Decreased endurance, Decreased knowledge of use of DME, Decreased mobility, Decreased range of motion, Decreased safety awareness, Decreased strength, Difficulty walking, Hypomobility, Impaired perceived functional ability, Impaired flexibility, Impaired sensation, Impaired UE functional use  Visit Diagnosis: Other lack of coordination  Muscle weakness (generalized)  Other abnormalities of gait and mobility  Unsteadiness on feet     Problem List Patient Active Problem List   Diagnosis Date Noted   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

## 2020-12-26 ENCOUNTER — Telehealth: Payer: Self-pay | Admitting: Urology

## 2020-12-26 NOTE — Telephone Encounter (Signed)
Patient's sister called @ 4:30 stating that he had not urinated but once today and that his urine was very dark. She spoke with Doreen Salvage and was advised to take him to the ER or to an UC now. She declined and said she would just wait and contact the office back in the morning. I called her back and advised her not to do that because if he waited until the morning it could cause him more harm and that he really needed to have a bladder scan to see how much urine was in there. I explained the importance of getting him seen sooner than later and she agreed to take him to the ER now.  Jonathan Newman

## 2020-12-27 ENCOUNTER — Encounter: Payer: Medicaid Other | Admitting: Speech Pathology

## 2020-12-27 ENCOUNTER — Ambulatory Visit: Payer: Medicaid Other

## 2020-12-28 ENCOUNTER — Ambulatory Visit: Payer: Medicaid Other | Admitting: Physical Therapy

## 2020-12-28 ENCOUNTER — Other Ambulatory Visit: Payer: Self-pay

## 2020-12-28 ENCOUNTER — Encounter: Payer: Medicaid Other | Admitting: Speech Pathology

## 2020-12-28 DIAGNOSIS — R278 Other lack of coordination: Secondary | ICD-10-CM | POA: Diagnosis not present

## 2020-12-28 DIAGNOSIS — M6281 Muscle weakness (generalized): Secondary | ICD-10-CM

## 2020-12-28 DIAGNOSIS — R2689 Other abnormalities of gait and mobility: Secondary | ICD-10-CM

## 2020-12-28 DIAGNOSIS — R262 Difficulty in walking, not elsewhere classified: Secondary | ICD-10-CM

## 2020-12-28 DIAGNOSIS — R2681 Unsteadiness on feet: Secondary | ICD-10-CM

## 2020-12-28 NOTE — Therapy (Signed)
[facility] MAIN [facility] SERVICES [address] [place], [place], [address] Phone: [phone]   Fax:  [phone]  Physical Therapy Treatment/Physical Therapy Progress Note   Dates of reporting period  10/25/20   to   12/28/20   Patient Details  Name: Jonathan Newman MRN: [id number] Date of Birth: 1960/08/06 No data recorded  Encounter Date: 12/28/2020   PT End of Session - 12/28/20 1528     Visit Number 10    Number of Visits 25    Date for PT Re-Evaluation 01/17/21    Authorization Type Medicaid=Authorized combined PT/OT/SLP 27 visits    Authorization Time Period 9 authorized 10/31-12/31    Authorization - Visit Number 7    Authorization - Number of Visits 9    Progress Note Due on Visit 10    PT Start Time 0320    PT Stop Time 0400    PT Time Calculation (min) 40 min    Equipment Utilized During Treatment Gait belt    Activity Tolerance Patient tolerated treatment well    Behavior During Therapy Impulsive;WFL for tasks assessed/performed             Past Medical History:  Diagnosis Date   Constipation    CVA (cerebral vascular accident) (HCC)    Diarrhea    Dysarthria and anarthria    Hemiplegia and hemiparesis following cerebral infarction affecting right dominant side (HCC)    Hyperlipidemia    Pain in right leg     No past surgical history on file.  There were no vitals filed for this visit.   Subjective Assessment - 12/28/20 1527     Subjective Pt reports no pain or falls/LOB since last visit. Pt sister reports sigificant improvement in pt ability to transfer and assist with self care activities such as bathroom and bathing activities. Pt caregiver does report pain/ disocmfort in the R UE with bathing and shoulder movements. No other changes at this time and pt and caregiver are happy with progress.    Patient is accompained by: Family member   Sister Jonathan Newman   Pertinent History Patient is a 60 year old male with recent  history of L MCA stroke in Feb 2021.Per Dr. 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 Had developed DVT afterwards. Patient is present with sister, Jonathan Newman today who reports he has been living with her since May 2022 and uses his electric w/c in the home an has not walked since CVA- has manual w/c that he uses for community outings. She reports he requires assist with all ADLs and is impulsive but does require assist with transfers for safety- but states he does go to bathroom on his own at times.    Limitations Lifting;Standing;Walking;Writing;Reading;House hold activities    How long can you sit comfortably? no issues or limits    How long can you stand comfortably? <    How long can you walk comfortably? Has not walked since CVA    Patient Stated Goals Caregiver goal- to be as independent and do as much as he can on his own.    Currently in Pain? No/denies    Pain Onset In the past 7 days               Physical therapy treatment session today consisted of completing  assessment of goals and administration of testing as demonstrated in flow sheet. Addition treatments may be found below.    Exercise/Activity Sets/Reps/Time/ Resistance Assistance Charge type Comments  Nustep  L2 x 5 min  Belt around knees to prevent ER of R hip  TherEX To improve muscular endurance, as well as to iprove R LE muscle activation  Assist with LE positioning, belt around knees to prevent R hip ER   Transfers  x2 CGA therACT Cues for positioning, no LOB noted, moved to/from WC to mat table and to nustep without LOB. Unable to obtain full stand pivot.   Standing trials with Airex pad under L LE in order to increase weight shift to right x5 Min A for STS, x2 Mod/max A for weight shifting  therACT 1 PT on either side.  Assist with R LE positioning for adequate weight bearing. Mirror anterior for visual cues for weight shifting. Assist with shifting weight to the right. Approximately 30 second holds and pt self selects when he is fatigued and needs a rest.   Quad activation  X 20  minA for movement TherEX -cues to keep foot on floor to prevent hip flexion compensatory movement  -Mod/max A for demonstration of desired movement, near end of repetitions pt demonstrating improved quad activation with less hip flexor compensation                                             Treatment Provided this session   Pt educated throughout session about proper posture and technique with exercises. Improved exercise technique, movement at target joints, use of target muscles after min to mod verbal, visual, tactile cues. Note: Portions of this document were prepared using Dragon voice recognition software and although reviewed may contain unintentional dictation errors in syntax, grammar, or spelling.                         PT Education - 12/28/20 1527     Education Details Progress with therapy    Person(s) Educated Patient    Methods Explanation;Demonstration    Comprehension Returned demonstration;Verbal cues required;Tactile cues required              PT Short Term Goals - 12/28/20 1528       PT SHORT TERM GOAL #1   Title Pt will be independent with HEP in order to improve strength and balance in order to decrease fall risk and improve function at home and work.    Baseline 10/25/2020= Patient has no formal HEP in place12/8: Pt caregiver report sHEP has been going well.    Time 6    Period Weeks    Status Achieved    Target Date 12/06/20      PT SHORT TERM GOAL #2   Title Patient will perform all sit to stand transfers with CGA for improved functional mobility and decreased dependence on caregivers.    Baseline 10/25/2020=Patient requires mod assist with all sit to stand transfers,  12/8: sister reports improved independence with most transfers, some guarding still necessary for safety, deomnstrates ability to transfer to/from nuscetp and WC as well as to/from mat table.    Time 6    Period Weeks    Status Achieved    Target Date 12/06/20  PT Long Term Goals - 12/28/20 1539       PT LONG TERM GOAL #1   Title Pt will improve FOTO to target score of 43  to display perceived improvements in ability to complete ADL's.    Baseline 10/25/2020= 29 12/8: 33.1    Time 12    Period Weeks    Status Revised    Target Date 03/22/21      PT LONG TERM GOAL #2   Title Patient will perform stand pivot transfer with supervision from all surfaces without loss of balance or VC for safety.    Baseline 10/25/2020- Patient requires Mod assist for safe transfers with min ability to place any weight through right LE.12/8: unable to perform full stand pivot withotu assistance . with full stand, pt requires min/mod A to prevent LOB    Time 12    Period Weeks    Status On-going    Target Date 03/22/21      PT LONG TERM GOAL #3   Title Patient will perform static stand with least restrictive assistive device > 2 min with > 25% weight bearing through right LE without LOB.    Baseline 10/25/2020- Patient able to stand approx 30 sec yet did not place any weight through Right LE. 12/8: able to stand for approximately 30 sec with R LE on airex pad and Min WB on the R LE and ModA from 2x PT for LE placement and to assist with weight shifting    Time 12    Period Weeks    Status On-going    Target Date 03/22/21                   Plan - 12/28/20 1633     Clinical Impression Statement Patient presents to therapy for physical therapy progress note following several physical therapy sessions.  Patient has made significant progress with ability to transfer as well as with ability to assist with self-care activities including bathing and toileting.  Patient has a  significant progress in his ability to stand but still has a lot of difficulty bearing weight through his right lower extremity.  Patient continues to work hard with following activities and is comfortable with home exercise program.  Patient will continue to benefit from skilled physical therapy intervention in order to improve his lower extremity strength, transfer ability, overall function, and quality of life. Goals have been assessed and updated. Patient's condition has the potential to improve in response to therapy. Maximum improvement is yet to be obtained. The anticipated improvement is attainable and reasonable in a generally predictable time.       Personal Factors and Comorbidities Comorbidity 3+;Time since onset of injury/illness/exacerbation    Comorbidities CVA, Aphasia, Hyperlipidemia    Examination-Activity Limitations Bathing;Bed Mobility;Bend;Caring for Others;Carry;Lift;Reach Overhead;Squat;Stairs;Stand;Toileting;Transfers    Examination-Participation Restrictions Cleaning;Community Activity;Driving;Laundry;Medication Management;Meal Prep;Occupation;Personal Finances;Shop;Yard Work    Rehab Potential Fair    PT Frequency 1x / week    PT Duration 12 weeks    PT Treatment/Interventions ADLs/Self Care Home Management;Aquatic Therapy;Cryotherapy;Electrical Stimulation;Moist Heat;Ultrasound;DME Instruction;Gait training;Stair training;Functional mobility training;Therapeutic activities;Therapeutic exercise;Balance training;Neuromuscular re-education;Patient/family education;Wheelchair mobility training;Manual techniques;Passive range of motion;Dry needling;Splinting;Orthotic Fit/Training;Taping    PT Next Visit Plan Instruct in LE Strengthening for Home program, continue POC as previously indicated, balance, strength    PT Home Exercise Plan no updates    Consulted and Agree with Plan of Care Patient;Family member/caregiver    Family Member Consulted Sister- Jonathan Newman  Patient will benefit from skilled therapeutic intervention in order to improve the following deficits and impairments:  Abnormal gait, Decreased activity tolerance, Decreased balance, Decreased cognition, Decreased coordination, Decreased endurance, Decreased knowledge of use of DME, Decreased mobility, Decreased range of motion, Decreased safety awareness, Decreased strength, Difficulty walking, Hypomobility, Impaired perceived functional ability, Impaired flexibility, Impaired sensation, Impaired UE functional use  Visit Diagnosis: Muscle weakness (generalized)  Unsteadiness on feet  Other abnormalities of gait and mobility  Difficulty in walking, not elsewhere classified     Problem List Patient Active Problem List   Diagnosis Date Noted   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

## 2021-01-01 ENCOUNTER — Ambulatory Visit: Payer: Medicaid Other | Admitting: Physical Therapy

## 2021-01-01 ENCOUNTER — Ambulatory Visit: Payer: Medicaid Other | Admitting: Speech Pathology

## 2021-01-01 ENCOUNTER — Other Ambulatory Visit: Payer: Self-pay

## 2021-01-01 DIAGNOSIS — R482 Apraxia: Secondary | ICD-10-CM

## 2021-01-01 DIAGNOSIS — R2681 Unsteadiness on feet: Secondary | ICD-10-CM

## 2021-01-01 DIAGNOSIS — R4701 Aphasia: Secondary | ICD-10-CM

## 2021-01-01 DIAGNOSIS — R41841 Cognitive communication deficit: Secondary | ICD-10-CM

## 2021-01-01 DIAGNOSIS — R262 Difficulty in walking, not elsewhere classified: Secondary | ICD-10-CM

## 2021-01-01 DIAGNOSIS — M6281 Muscle weakness (generalized): Secondary | ICD-10-CM

## 2021-01-01 DIAGNOSIS — R2689 Other abnormalities of gait and mobility: Secondary | ICD-10-CM

## 2021-01-01 DIAGNOSIS — R278 Other lack of coordination: Secondary | ICD-10-CM | POA: Diagnosis not present

## 2021-01-01 NOTE — Therapy (Signed)
[facility] MAIN [facility] SERVICES [address] [place], [place], [address] Phone: [phone]   Fax:  [phone]  Physical Therapy Treatment  Patient Details  Name: Jonathan Newman MRN: [id number] Date of Birth: 1960-04-27 No data recorded  Encounter Date: 01/01/2021   PT End of Session - 01/01/21 1553     Visit Number 11    Number of Visits 25    Date for PT Re-Evaluation 01/17/21    Authorization Type Medicaid=Authorized combined PT/OT/SLP 27 visits    Authorization Time Period 9 authorized 10/31-12/31    Authorization - Visit Number 8    Authorization - Number of Visits 9    Progress Note Due on Visit 20    PT Start Time 1515    PT Stop Time 1558    PT Time Calculation (min) 43 min    Equipment Utilized During Treatment Gait belt    Activity Tolerance Patient tolerated treatment well    Behavior During Therapy Impulsive;WFL for tasks assessed/performed             Past Medical History:  Diagnosis Date   Constipation    CVA (cerebral vascular accident) (HCC)    Diarrhea    Dysarthria and anarthria    Hemiplegia and hemiparesis following cerebral infarction affecting right dominant side (HCC)    Hyperlipidemia    Pain in right leg     No past surgical history on file.  There were no vitals filed for this visit.   Subjective Assessment - 01/01/21 1604     Subjective Pt reports no pain or falls/LOB since last visit. Pt and caregiver report no other significant issues since last visit.    Patient is accompained by: Family member   Sister [name]   Pertinent History Patient is a 60 year old male with recent history of L MCA stroke in Feb 2021.Per Dr. 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 Had developed DVT afterwards. Patient is present  with sister, [name] today who reports he has been living with her since May 2022 and uses his electric w/c in the home an has not walked since CVA- has manual w/c that he uses for community outings. She reports he requires assist with all ADLs and is impulsive but does require assist with transfers for safety- but states he does go to bathroom on his own at times.    Limitations Lifting;Standing;Walking;Writing;Reading;House hold activities    How long can you sit comfortably? no issues or limits    How long can you stand comfortably? <    How long can you walk comfortably? Has not walked since CVA    Patient Stated Goals Caregiver goal- to be as independent and do as much as he can on his own.    Currently in Pain? No/denies    Pain Onset In the past 7 days                   Exercise/Activity Sets/Reps/Time/ Resistance Assistance Charge type Comments  Nustep  L0 2 x 2 min (No UE Assist)    Belt around knees to prevent ER of R hip  TherEX   Transfers  x2 CGA therACT Cues for positioning, no LOB noted, moved to/from WC to mat table and to nustep without LOB. Unable to obtain full stand pivot.  Standing trials with Airex pad under L LE in order to increase weight shift to right  x3 Min A for STS, x2 Mod A for weight shifting  therACT 1 PT on involved side and one SPT on opposite. Assist with R LE positioning for adequate weight bearing. Mirror anterior for visual cues for weight shifting. Assist with shifting weight to the right. 3-5 sec holds on R LE with eight shift, noted weight shift and R foot fully in contact with ground with each weight shift   Quad activation seated with goal of touching PT foot with toes X 20  minA for movement TherEX -Mod/max A for demonstration of desired movement, near end of repetitions pt demonstrating improved quad activation with less hip flexor compensation  PT constrained (with hand) R LE to prevent hip flexion compensatory movement, improved quad  activation noted following   Weight shifting in standing  2 sets of 2 reps 1 set of 3 reps  Max A for R LE placement  TherACT         Leg press  2 x 10  Bil 1 x 10 SL (isometrics)   TherEX -single leg performed isometrics, some quad activation noted  Mod A for positioning and maintenance of positioning of R LE   R LE hip IR  3 x 5  Manual and tactile cues  therEX - seated on leg press with R LE against leg press   Hamstring curls 2 x 10  Max A to place knee in extension  There EX -Max A to place knee in extension followed by cues for hamstring muscle activation               Treatment Provided this session   Pt educated throughout session about proper posture and technique with exercises. Improved exercise technique, movement at target joints, use of target muscles after min to mod verbal, visual, tactile cues.  Note: Portions of this document were prepared using Dragon voice recognition software and although reviewed may contain unintentional dictation errors in syntax, grammar, or spelling.                       PT Education - 01/01/21 1650     Education Details Progress with weight shifting    Person(s) Educated Patient    Methods Explanation    Comprehension Verbalized understanding              PT Short Term Goals - 12/28/20 1528       PT SHORT TERM GOAL #1   Title Pt will be independent with HEP in order to improve strength and balance in order to decrease fall risk and improve function at home and work.    Baseline 10/25/2020= Patient has no formal HEP in place12/8: Pt caregiver report sHEP has been going well.    Time 6    Period Weeks    Status Achieved    Target Date 12/06/20      PT SHORT TERM GOAL #2   Title Patient will perform all sit to stand transfers with CGA for improved functional mobility and decreased dependence on caregivers.    Baseline 10/25/2020=Patient requires mod assist with all sit to stand transfers, 12/8: sister reports  improved independence with most transfers, some guarding still necessary for safety, deomnstrates ability to transfer to/from nuscetp and WC as well as to/from mat table.    Time 6    Period Weeks    Status Achieved  Target Date 12/06/20               PT Long Term Goals - 12/28/20 1539       PT LONG TERM GOAL #1   Title Pt will improve FOTO to target score of 43  to display perceived improvements in ability to complete ADL's.    Baseline 10/25/2020= 29 12/8: 33.1    Time 12    Period Weeks    Status Revised    Target Date 03/22/21      PT LONG TERM GOAL #2   Title Patient will perform stand pivot transfer with supervision from all surfaces without loss of balance or VC for safety.    Baseline 10/25/2020- Patient requires Mod assist for safe transfers with min ability to place any weight through right LE.12/8: unable to perform full stand pivot withotu assistance . with full stand, pt requires min/mod A to prevent LOB    Time 12    Period Weeks    Status On-going    Target Date 03/22/21      PT LONG TERM GOAL #3   Title Patient will perform static stand with least restrictive assistive device > 2 min with > 25% weight bearing through right LE without LOB.    Baseline 10/25/2020- Patient able to stand approx 30 sec yet did not place any weight through Right LE. 12/8: able to stand for approximately 30 sec with R LE on airex pad and Min WB on the R LE and ModA from 2x PT for LE placement and to assist with weight shifting    Time 12    Period Weeks    Status On-going    Target Date 03/22/21                   Plan - 01/01/21 1554     Clinical Impression Statement Pt presents to therapy with good motivation for completion of PT program. Pt progressed with Nustep activity without UE use today but had to break down into intervals for completion of full time. Pt did demonstrate some fatigue following leg press and R hip IR activity today.  Patient required additional  motivation from PT in order for completion of physical therapy session.  Patient did demonstrate some improvement in hamstring muscle activation following physical therapist manually moving knee into extension.  Patient also able to complete minimal quad muscle activation with physical therapist manually constraining right lower extremity to prevent hip flexion as compensatory movement.  Patient will benefit from skilled physical therapy intervention in order to improve his lower extremity strength, mobility, ambulatory capacity, and improve caregiver burden.    Personal Factors and Comorbidities Comorbidity 3+;Time since onset of injury/illness/exacerbation    Comorbidities CVA, Aphasia, Hyperlipidemia    Examination-Activity Limitations Bathing;Bed Mobility;Bend;Caring for Others;Carry;Lift;Reach Overhead;Squat;Stairs;Stand;Toileting;Transfers    Examination-Participation Restrictions Cleaning;Community Activity;Driving;Laundry;Medication Management;Meal Prep;Occupation;Personal Finances;Shop;Yard Work    Rehab Potential Fair    PT Frequency 1x / week    PT Duration 12 weeks    PT Treatment/Interventions ADLs/Self Care Home Management;Aquatic Therapy;Cryotherapy;Electrical Stimulation;Moist Heat;Ultrasound;DME Instruction;Gait training;Stair training;Functional mobility training;Therapeutic activities;Therapeutic exercise;Balance training;Neuromuscular re-education;Patient/family education;Wheelchair mobility training;Manual techniques;Passive range of motion;Dry needling;Splinting;Orthotic Fit/Training;Taping    PT Next Visit Plan Instruct in LE Strengthening for Home program, continue POC as previously indicated, balance, strength    PT Home Exercise Plan no updates    Consulted and Agree with Plan of Care Patient;Family member/caregiver    Family Member Consulted Sister- [name]  Patient will benefit from skilled therapeutic intervention in order to improve the following deficits  and impairments:  Abnormal gait, Decreased activity tolerance, Decreased balance, Decreased cognition, Decreased coordination, Decreased endurance, Decreased knowledge of use of DME, Decreased mobility, Decreased range of motion, Decreased safety awareness, Decreased strength, Difficulty walking, Hypomobility, Impaired perceived functional ability, Impaired flexibility, Impaired sensation, Impaired UE functional use  Visit Diagnosis: Muscle weakness (generalized)  Unsteadiness on feet  Difficulty in walking, not elsewhere classified  Other abnormalities of gait and mobility     Problem List Patient Active Problem List   Diagnosis Date Noted   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 [place], [place], [address] Phone: [phone]   Fax:  [phone]  Name: Dallyn Bergland MRN: [id number] Date of Birth: 03-23-1960

## 2021-01-01 NOTE — Therapy (Signed)
[place] MAIN [facility] SERVICES [address] [place], [place], [address] Phone: [phone]   Fax:  [phone]  Speech Language Pathology Treatment  Patient Details  Name: Jonathan Newman MRN: [id number] Date of Birth: 1960/04/08 Referring Provider (SLP): Dr. Jennings Books   Encounter Date: 01/01/2021   End of Session - 01/01/21 1616     Visit Number 11    Number of Visits 15    Date for SLP Re-Evaluation 01/23/21    Authorization Type Medicaid - 27 visits combined for SLP/PT    Authorization - Visit Number 10    Authorization - Number of Visits 15    Progress Note Due on Visit 10    SLP Start Time 58    SLP Stop Time  1600    SLP Time Calculation (min) 55 min             Past Medical History:  Diagnosis Date   Constipation    CVA (cerebral vascular accident) ([place])    Diarrhea    Dysarthria and anarthria    Hemiplegia and hemiparesis following cerebral infarction affecting right dominant side ([place])    Hyperlipidemia    Pain in right leg     No past surgical history on file.  There were no vitals filed for this visit.   Subjective Assessment - 01/01/21 1616     Subjective Awaiting insurance approval for communication device    Patient is accompained by: Family member   sister [name]   Currently in Pain? No/denies                   ADULT SLP TREATMENT - 01/01/21 1619       General Information   Behavior/Cognition Alert;Cooperative;Decreased sustained attention    HPI Jonathan Newman is a 60 y.o. male referred by Dr. Manuella Ghazi for SLP evaluation. Hx noted for DVT, HTN Pt with reported CVA in February 2021 (records from OSH are unavailable), with subsequent rehabilitation in [place]. Pt now living with his sister in [place]. Repeat MRI brain on 09/29/20 showed "Large chronic left MCA territory and left MCA/PCA watershed territory cortical/subcortical infarct, as described. Superimposed chronic perforator infarct within  the left centrum semiovale/corona radiata and left basal ganglia. Associated wallerian degeneration on the left." Chronic infarcts also within left thalamus and bilateral pons, and mild generalized cerebral and cerebellar atrophy.      Cognitive-Linquistic Treatment   Treatment focused on Aphasia    Skilled Treatment Sister requested demo on how to add items to pt's device when it arrives. Demo'd using clinic device and she was able to return demonstration. Pt completed simple therapy activities (opposites from F:2, single digit addition, phrase completion) with initial mod cues to reduce impulsivity fading to occasional min A. Pt used device to talk about favorite foods, navigating using menu buttons appropriately. Pt gestured to device to ask when his would arrive. Required verbal, visual cues for comprehension of details of the process (SLP paperwork, MD prescription, insurance approval).      Assessment / Recommendations / Plan   Plan Continue with current plan of care      Progression Toward Goals   Progression toward goals Progressing toward goals                SLP Short Term Goals - 12/13/20 1631       SLP SHORT TERM GOAL #1   Title Patient will comprehend simple Y/N questions >80% accuracy with written cues/ AAC support.  Baseline 60%    Time 5    Period Weeks    Status Achieved      SLP SHORT TERM GOAL #2   Title Patient will indicate when he does not comprehend in >80% of trials (word to sentence level), using facial expression, gesture, or AAC.    Baseline 0%    Time 5    Period Weeks    Status Partially Met      SLP SHORT TERM GOAL #3   Title Patient will answer simple biographical questions 80% accuracy (Y/N questions or AAC).    Baseline 80% with extended time, repetition, supportive conversation strategies    Time 5    Period Weeks    Status Partially Met              SLP Long Term Goals - 12/13/20 1632       SLP LONG TERM GOAL #1   Title Pt  will communicate emergency information 100% accuracy, using AAC if necessary.    Baseline 0%    Time 12    Period Weeks    Status On-going      SLP LONG TERM GOAL #2   Title Patient will reduce frustration by communicating thoughts/feelings with AAC in 80% of opportunities.    Baseline can only use facial expressions    Time 12    Period Weeks    Status On-going      SLP LONG TERM GOAL #3   Title Pt will successfully convey requests for outings/ personal needs outside of ST >75% of the time, using AAC if necessary.    Baseline 0%    Time 12    Period Weeks    Status On-going      SLP LONG TERM GOAL #4   Title Pt will reduce social isolation risk by participating in simple conversational exchange (3-5 minutes), using AAC.    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 Visual cues (AAC icons) appear to increase pt's comprehension; pt demonstrating emerging ability to navigate communication device to communicate basic wants/needs. Pt able to complete simple language tasks in therapy exercises section on clinic [place] device today; will continue to demo appropriate activities for home practice once pt's permanent device arrives. I recommend skilled ST with focus on improving functional communication, trialing use of AAC to determine best way to support pt in communicating/comprehending.    Speech Therapy Frequency 2x / week    Duration 12 weeks   ST limited to 14 visits this year due to Medicaid visit limit (27 combined)   Treatment/Interventions Language facilitation;Environmental controls;Cueing hierarchy;SLP instruction and feedback;Compensatory techniques;Cognitive reorganization;Functional tasks;Compensatory strategies;Patient/family  education;Internal/external aids;Multimodal communcation approach    Potential to Achieve Goals Good   with AAC   Potential Considerations Severity of impairments;Financial resources   has good family support, appears highly motivated   Consulted and Agree with Plan of Care Patient             Patient will benefit from skilled therapeutic intervention in order to improve the following deficits and impairments:   Aphasia  Verbal apraxia  Cognitive communication deficit    Problem List Patient Active Problem List  Diagnosis Date Noted   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

## 2021-01-03 ENCOUNTER — Ambulatory Visit: Payer: Medicaid Other | Admitting: Speech Pathology

## 2021-01-03 ENCOUNTER — Encounter: Payer: Medicaid Other | Admitting: Speech Pathology

## 2021-01-03 ENCOUNTER — Ambulatory Visit: Payer: Medicaid Other

## 2021-01-08 ENCOUNTER — Ambulatory Visit: Payer: Medicaid Other | Admitting: Speech Pathology

## 2021-01-08 ENCOUNTER — Other Ambulatory Visit: Payer: Self-pay

## 2021-01-08 DIAGNOSIS — R278 Other lack of coordination: Secondary | ICD-10-CM | POA: Diagnosis not present

## 2021-01-08 DIAGNOSIS — R482 Apraxia: Secondary | ICD-10-CM

## 2021-01-08 DIAGNOSIS — R41841 Cognitive communication deficit: Secondary | ICD-10-CM

## 2021-01-08 DIAGNOSIS — R4701 Aphasia: Secondary | ICD-10-CM

## 2021-01-08 NOTE — Therapy (Signed)
[place] MAIN [facility] SERVICES [address] [place], [place], [address] Phone: [phone]   Fax:  [phone]  Speech Language Pathology Treatment  Patient Details  Name: Jonathan Newman MRN: [id number] Date of Birth: 1960/09/07 Referring Provider (SLP): Dr. Jennings Books   Encounter Date: 01/08/2021   End of Session - 01/08/21 1701     Visit Number 12    Number of Visits 15    Date for SLP Re-Evaluation 01/23/21    Authorization Type Medicaid - 27 visits combined for SLP/PT    Authorization - Visit Number 11    Authorization - Number of Visits 15    Progress Note Due on Visit 10    SLP Start Time 1505    SLP Stop Time  1600    SLP Time Calculation (min) 55 min    Activity Tolerance Patient tolerated treatment well             Past Medical History:  Diagnosis Date   Constipation    CVA (cerebral vascular accident) ([place])    Diarrhea    Dysarthria and anarthria    Hemiplegia and hemiparesis following cerebral infarction affecting right dominant side ([place])    Hyperlipidemia    Pain in right leg     No past surgical history on file.  There were no vitals filed for this visit.   Subjective Assessment - 01/08/21 1646     Subjective "He had an episode Saturday"    Patient is accompained by: Family member   sister   Currently in Pain? No/denies                   ADULT SLP TREATMENT - 01/08/21 1647       General Information   Behavior/Cognition Alert;Cooperative;Decreased sustained attention    HPI Jonathan Newman is a 60 y.o. male referred by Dr. Manuella Ghazi for SLP evaluation. Hx noted for DVT, HTN Pt with reported CVA in February 2021 (records from OSH are unavailable), with subsequent rehabilitation in [place]. Pt now living with his sister in [place]. Repeat MRI brain on 09/29/20 showed "Large chronic left MCA territory and left MCA/PCA watershed territory cortical/subcortical infarct, as described. Superimposed chronic  perforator infarct within the left centrum semiovale/corona radiata and left basal ganglia. Associated wallerian degeneration on the left." Chronic infarcts also within left thalamus and bilateral pons, and mild generalized cerebral and cerebellar atrophy.      Cognitive-Linquistic Treatment   Treatment focused on Aphasia    Skilled Treatment Sister reported pt had outburst Saturday. Mood was poor and after communication breakdown pt got frustrated and pushed her, broke car door handle trying to get in the passenger side. Sister placed call to neurologist, awaiting callback. 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 Added icon for pt to express, "I miss my life before my stroke." Provided support and validated pt feelings related to his communication difficulties. Encouragement provided to pt and sister that with communication device at home, can continue to customize to reduce frustration and communication breakdowns. Added additional items for expressing discomfort/making needs known (hot/cold, removal of compression hose), as well as topics of pt interest (music).      Assessment / Recommendations / Plan   Plan Continue with current plan of care      Progression Toward Goals   Progression toward goals Progressing  toward goals              SLP Education - 01/08/21 1700     Education Details affirmed/validated pt feelings regarding his communication difficulties. how to use communication device (or visual images on phone until pt's home device arrives) to reduce breakdowns    Person(s) Educated Patient;Caregiver(s)    Methods Explanation    Comprehension Verbalized understanding              SLP Short Term Goals - 12/13/20 1631       SLP SHORT TERM GOAL #1   Title Patient will comprehend simple Y/N questions >80% accuracy with written cues/ AAC  support.    Baseline 60%    Time 5    Period Weeks    Status Achieved      SLP SHORT TERM GOAL #2   Title Patient will indicate when he does not comprehend in >80% of trials (word to sentence level), using facial expression, gesture, or AAC.    Baseline 0%    Time 5    Period Weeks    Status Partially Met      SLP SHORT TERM GOAL #3   Title Patient will answer simple biographical questions 80% accuracy (Y/N questions or AAC).    Baseline 80% with extended time, repetition, supportive conversation strategies    Time 5    Period Weeks    Status Partially Met              SLP Long Term Goals - 12/13/20 1632       SLP LONG TERM GOAL #1   Title Pt will communicate emergency information 100% accuracy, using AAC if necessary.    Baseline 0%    Time 12    Period Weeks    Status On-going      SLP LONG TERM GOAL #2   Title Patient will reduce frustration by communicating thoughts/feelings with AAC in 80% of opportunities.    Baseline can only use facial expressions    Time 12    Period Weeks    Status On-going      SLP LONG TERM GOAL #3   Title Pt will successfully convey requests for outings/ personal needs outside of ST >75% of the time, using AAC if necessary.    Baseline 0%    Time 12    Period Weeks    Status On-going      SLP LONG TERM GOAL #4   Title Pt will reduce social isolation risk by participating in simple conversational exchange (3-5 minutes), using AAC.    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 Visual cues (AAC icons) appear to increase pt's comprehension; pt demonstrating emerging ability to navigate communication device to communicate basic wants/needs. Pt able to locate common requests such as  favorite foods, family members, and items relating to his dog independently. Min-mod cues for more obscure requests/expression of feelings. I recommend skilled ST with focus on improving functional communication, trialing use of AAC to determine best way to support pt in communicating/comprehending.    Speech Therapy Frequency 2x / week    Duration 12 weeks   ST limited to 14  visits this year due to Medicaid visit limit (27 combined)   Treatment/Interventions Language facilitation;Environmental controls;Cueing hierarchy;SLP instruction and feedback;Compensatory techniques;Cognitive reorganization;Functional tasks;Compensatory strategies;Patient/family education;Internal/external aids;Multimodal communcation approach    Potential to Achieve Goals Good   with AAC   Potential Considerations Severity of impairments;Financial resources   has good family support, appears highly motivated   Consulted and Agree with Plan of Care Patient             Patient will benefit from skilled therapeutic intervention in order to improve the following deficits and impairments:   Aphasia  Verbal apraxia  Cognitive communication deficit    Problem List Patient Active Problem List   Diagnosis Date Noted   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

## 2021-01-09 ENCOUNTER — Ambulatory Visit (INDEPENDENT_AMBULATORY_CARE_PROVIDER_SITE_OTHER): Payer: Medicaid Other | Admitting: Nurse Practitioner

## 2021-01-09 VITALS — BP 123/81 | HR 51 | Ht 68.0 in

## 2021-01-09 DIAGNOSIS — I890 Lymphedema, not elsewhere classified: Secondary | ICD-10-CM | POA: Diagnosis not present

## 2021-01-10 ENCOUNTER — Ambulatory Visit: Payer: Medicaid Other

## 2021-01-10 ENCOUNTER — Encounter: Payer: Medicaid Other | Admitting: Speech Pathology

## 2021-01-10 ENCOUNTER — Ambulatory Visit: Payer: Medicaid Other | Admitting: Speech Pathology

## 2021-01-16 ENCOUNTER — Encounter: Payer: Medicaid Other | Admitting: Speech Pathology

## 2021-01-16 ENCOUNTER — Encounter (INDEPENDENT_AMBULATORY_CARE_PROVIDER_SITE_OTHER): Payer: Self-pay | Admitting: Nurse Practitioner

## 2021-01-16 NOTE — Progress Notes (Signed)
Subjective:    Patient ID: Jonathan Newman, male    DOB: [birth year], 60 y.o.   MRN: [id number] Chief Complaint  Patient presents with   Follow-up    3 Mo no studies    The patient returns to the office for followup evaluation regarding leg swelling.  The swelling has improved quite a bit and the pain associated with swelling has decreased substantially. There have not been any interval development of a ulcerations or wounds.  Since the previous visit the patient has been wearing graduated compression stockings and has noted significant improvement in the lymphedema. The patient has been using compression routinely morning until night.  The patient also states elevation during the day and exercise is being done too.        Review of Systems  Cardiovascular:  Positive for leg swelling.  Musculoskeletal:  Positive for gait problem.  All other systems reviewed and are negative.     Objective:   Physical Exam Vitals reviewed.  HENT:     Head: Normocephalic.  Cardiovascular:     Rate and Rhythm: Normal rate.     Pulses: Normal pulses.  Pulmonary:     Effort: Pulmonary effort is normal.  Musculoskeletal:     Right lower leg: 1+ Edema present.     Left lower leg: 1+ Edema present.  Skin:    General: Skin is warm and dry.  Neurological:     Mental Status: He is alert and oriented to person, place, and time.     Motor: Weakness present.     Gait: Gait abnormal.     Deep Tendon Reflexes: Reflexes abnormal.  Psychiatric:        Mood and Affect: Mood normal.        Behavior: Behavior normal.        Thought Content: Thought content normal.        Judgment: Judgment normal.    BP 123/81    Pulse (!) 51    Ht 5\' 8"  (1.727 m)    BMI 33.75 kg/m   Past Medical History:  Diagnosis Date   Constipation    CVA (cerebral vascular accident) ([facility])    Diarrhea    Dysarthria and anarthria    Hemiplegia and hemiparesis following cerebral infarction affecting right dominant side ([facility])     Hyperlipidemia    Pain in right leg     Social History   Socioeconomic History   Marital status: Single    Spouse name: Not on file   Number of children: Not on file   Years of education: Not on file   Highest education level: Not on file  Occupational History   Not on file  Tobacco Use   Smoking status: Former   Smokeless tobacco: Never  Substance and Sexual Activity   Alcohol use: Not Currently   Drug use: Not Currently   Sexual activity: Not on file  Other Topics Concern   Not on file  Social History Narrative   Not on file   Social Determinants of Health   Financial Resource Strain: Not on file  Food Insecurity: Not on file  Transportation Needs: Not on file  Physical Activity: Not on file  Stress: Not on file  Social Connections: Not on file  Intimate Partner Violence: Not on file    History reviewed. No pertinent surgical history.  History reviewed. No pertinent family history.  No Known Allergies  CBC Latest Ref Rng & Units 10/02/2020 06/06/2020 01/31/2020  WBC 4.0 -  10.5 K/uL 7.8 9.7 9.7  Hemoglobin 13.0 - 17.0 g/dL 15.2 13.8 13.6  Hematocrit 39.0 - 52.0 % 44.3 42.0 42.2  Platelets 150 - 400 K/uL 322 316 320      CMP     Component Value Date/Time   NA 140 10/02/2020 1028   K 4.1 10/02/2020 1028   CL 105 10/02/2020 1028   CO2 29 10/02/2020 1028   GLUCOSE 107 (H) 10/02/2020 1028   BUN 15 10/02/2020 1028   CREATININE 0.89 10/02/2020 1028   CALCIUM 8.9 10/02/2020 1028   PROT 7.0 10/02/2020 1028   ALBUMIN 4.2 10/02/2020 1028   AST 28 10/02/2020 1028   ALT 44 10/02/2020 1028   ALKPHOS 97 10/02/2020 1028   BILITOT 0.8 10/02/2020 1028   GFRNONAA >60 10/02/2020 1028     No results found.     Assessment & Plan:   1. Lymphedema  No surgery or intervention at this point in time.    I have reviewed my discussion with the patient regarding lymphedema and why it  causes symptoms.  Patient will continue wearing graduated compression stockings  class 1 (20-30 mmHg) on a daily basis a prescription was given. The patient is reminded to put the stockings on first thing in the morning and removing them in the evening. The patient is instructed specifically not to sleep in the stockings.   In addition, behavioral modification throughout the day will be continued.  This will include frequent elevation (such as in a recliner), use of over the counter pain medications as needed and exercise such as walking.  I have reviewed systemic causes for chronic edema such as liver, kidney and cardiac etiologies and there does not appear to be any significant changes in these organ systems over the past year.  The patient is under the impression that these organ systems are all stable and unchanged.    The patient will continue aggressive use of the  lymph pump.  This will continue to improve the edema control and prevent sequela such as ulcers and infections.   The patient will follow-up in 6 months   Current Outpatient Medications on File Prior to Visit  Medication Sig Dispense Refill   ACETAMINOPHEN PO Take 650 mg by mouth every 6 (six) hours as needed.     atorvastatin (LIPITOR) 40 MG tablet Take 40 mg by mouth at bedtime.     cephALEXin (KEFLEX) 500 MG capsule Take 1 capsule (500 mg total) by mouth 3 (three) times daily. 42 capsule 1   divalproex (DEPAKOTE) 125 MG DR tablet Take 125 mg by mouth at bedtime.     ELIQUIS 5 MG TABS tablet Take 5 mg by mouth 2 (two) times daily.     lisinopril (ZESTRIL) 20 MG tablet      loratadine (CLARITIN) 10 MG tablet Take 10 mg by mouth daily.     mirabegron ER (MYRBETRIQ) 50 MG TB24 tablet Take 1 tablet (50 mg total) by mouth daily. 30 tablet 0   ondansetron (ZOFRAN ODT) 4 MG disintegrating tablet Take 1 tablet (4 mg total) by mouth every 8 (eight) hours as needed for nausea or vomiting. 20 tablet 0   PARoxetine (PAXIL) 10 MG tablet Take 10 mg by mouth daily.     sennosides-docusate sodium (SENOKOT-S) 8.6-50 MG  tablet Take 1 tablet by mouth as directed. Every 3 days     zolpidem (AMBIEN) 10 MG tablet Take 10 mg by mouth at bedtime as needed.     divalproex (  DEPAKOTE) 250 MG DR tablet Take by mouth.     No current facility-administered medications on file prior to visit.    There are no Patient Instructions on file for this visit. No follow-ups on file.   Kris Hartmann, NP

## 2021-01-17 ENCOUNTER — Ambulatory Visit: Payer: Medicaid Other

## 2021-01-17 ENCOUNTER — Encounter: Payer: Medicaid Other | Admitting: Speech Pathology

## 2021-01-17 ENCOUNTER — Other Ambulatory Visit: Payer: Self-pay

## 2021-01-17 DIAGNOSIS — R2681 Unsteadiness on feet: Secondary | ICD-10-CM

## 2021-01-17 DIAGNOSIS — M6281 Muscle weakness (generalized): Secondary | ICD-10-CM

## 2021-01-17 DIAGNOSIS — R2689 Other abnormalities of gait and mobility: Secondary | ICD-10-CM

## 2021-01-17 DIAGNOSIS — R278 Other lack of coordination: Secondary | ICD-10-CM | POA: Diagnosis not present

## 2021-01-17 NOTE — Therapy (Signed)
[facility] MAIN [facility] SERVICES [address] [place], [place], [address] Phone: [phone]   Fax:  [phone]  Physical Therapy Treatment  Patient Details  Name: Jonathan Newman MRN: [id number] Date of Birth: 08-Jun-1960 No data recorded  Encounter Date: 01/17/2021   PT End of Session - 01/17/21 1651     Visit Number 12    Number of Visits 25    Date for PT Re-Evaluation 01/17/21    Authorization Type Medicaid=Authorized combined PT/OT/SLP 27 visits    Authorization Time Period 9 authorized 10/31-12/31    Authorization - Visit Number 8    Authorization - Number of Visits 9    Progress Note Due on Visit 20    PT Start Time 1551    PT Stop Time 1639    PT Time Calculation (min) 48 min    Equipment Utilized During Treatment Gait belt    Activity Tolerance Patient tolerated treatment well    Behavior During Therapy Impulsive;WFL for tasks assessed/performed             Past Medical History:  Diagnosis Date   Constipation    CVA (cerebral vascular accident) ([facility])    Diarrhea    Dysarthria and anarthria    Hemiplegia and hemiparesis following cerebral infarction affecting right dominant side ([facility])    Hyperlipidemia    Pain in right leg     History reviewed. No pertinent surgical history.  There were no vitals filed for this visit.   Subjective Assessment - 01/17/21 1552     Subjective Pt had a good holiday weekend. Pt reports no pain. Pt sister reports pt has been kind of depressed today due to loss of his mother 2 years ago. Pt sister reports pt has had no stumbles/falls. Pt got a therapeutic ball for christmas and has been able to practice kicking with it.    Patient is accompained by: Family member   Sister Jonathan Newman   Pertinent History Patient is a 60 year old male with recent history of L MCA stroke in Feb 2021.Per Dr. 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 Had developed DVT afterwards. Patient is present with sister, [name] today who reports he has been living with her since May 2022 and uses his electric w/c in the home an has not walked since CVA- has manual w/c that he uses for community outings. She reports he requires assist with all ADLs and is impulsive but does require assist with transfers for safety- but states he does go to bathroom on his own at times.    Limitations Lifting;Standing;Walking;Writing;Reading;House hold activities    How long can you sit comfortably? no issues or limits    How long can you stand comfortably? <    How long can you walk comfortably? Has not walked since CVA    Patient Stated Goals Caregiver goal- to be as independent and do as much as he can on his own.    Currently in Pain? No/denies    Pain Onset In the past 7 days               INTERVENTIONS       Exercise/Activity Sets/Reps/Time/ Resistance Assistance Charge type Comments  Transfers  x4 Min a therACT Performed throughout session. Cuing to attend to task/slow speed/LE positioning.  Standing  endurance with using LUE to throw balls at dart board, also to encourage balance challenge   2 rounds CGA 2x Neuro Pt leaning BLEs against mat-table, but is otherwise able to maintain balance with CGA x2  LAQ 10x no weight RLE;  2.5# on LLE for 15x modA for movement TherEX PT assists with RLE concentric and eccentric. Cuing to focus on eccentric component  Weight shifting in standing with lateral rocks 3x6 Min-mod a  Neuro     Leg press  25# X multiple reps B,     TherEX Cuing to push through RLE, cuing for breathing technique, VC/TC to promote RLE adduction and IR     Pt educated throughout session about proper posture and technique with exercises. Improved exercise technique, movement at target joints, use of target muscles  after min to mod verbal, visual, tactile cues.      PT Education - 01/17/21 1650     Education Details exercise technique, body mechanics    Person(s) Educated Patient    Methods Explanation;Demonstration;Tactile cues;Verbal cues    Comprehension Returned demonstration;Verbalized understanding;Verbal cues required;Need further instruction;Tactile cues required              PT Short Term Goals - 12/28/20 1528       PT SHORT TERM GOAL #1   Title Pt will be independent with HEP in order to improve strength and balance in order to decrease fall risk and improve function at home and work.    Baseline 10/25/2020= Patient has no formal HEP in place12/8: Pt caregiver report sHEP has been going well.    Time 6    Period Weeks    Status Achieved    Target Date 12/06/20      PT SHORT TERM GOAL #2   Title Patient will perform all sit to stand transfers with CGA for improved functional mobility and decreased dependence on caregivers.    Baseline 10/25/2020=Patient requires mod assist with all sit to stand transfers, 12/8: sister reports improved independence with most transfers, some guarding still necessary for safety, deomnstrates ability to transfer to/from nuscetp and WC as well as to/from mat table.    Time 6    Period Weeks    Status Achieved    Target Date 12/06/20               PT Long Term Goals - 12/28/20 1539       PT LONG TERM GOAL #1   Title Pt will improve FOTO to target score of 43  to display perceived improvements in ability to complete ADL's.    Baseline 10/25/2020= 29 12/8: 33.1    Time 12    Period Weeks    Status Revised    Target Date 03/22/21      PT LONG TERM GOAL #2   Title Patient will perform stand pivot transfer with supervision from all surfaces without loss of balance or VC for safety.    Baseline 10/25/2020- Patient requires Mod assist for safe transfers with min ability to place any weight through right LE.12/8: unable to perform full stand  pivot withotu assistance . with full stand, pt requires min/mod A to prevent LOB    Time 12    Period Weeks    Status On-going    Target Date 03/22/21      PT LONG TERM GOAL #3   Title Patient will perform static stand with least restrictive assistive device > 2 min with > 25% weight bearing through right  LE without LOB.    Baseline 10/25/2020- Patient able to stand approx 30 sec yet did not place any weight through Right LE. 12/8: able to stand for approximately 30 sec with R LE on airex pad and Min WB on the R LE and ModA from 2x PT for LE placement and to assist with weight shifting    Time 12    Period Weeks    Status On-going    Target Date 03/22/21                   Plan - 01/17/21 1651     Clinical Impression Statement Pt highly motivated to participate in session. Focus this session on weight shifting and progressing standing balance. Pt was able to progress to more challenging balance/throwing intervention that also worked on promoting RLE weightbearing. Pt requires up to mod assist with standing interventions. The pt will benefit from further skilled PT to improve strength, balance, and general mobility to increase ease and safety with ADLs.    Personal Factors and Comorbidities Comorbidity 3+;Time since onset of injury/illness/exacerbation    Comorbidities CVA, Aphasia, Hyperlipidemia    Examination-Activity Limitations Bathing;Bed Mobility;Bend;Caring for Others;Carry;Lift;Reach Overhead;Squat;Stairs;Stand;Toileting;Transfers    Examination-Participation Restrictions Cleaning;Community Activity;Driving;Laundry;Medication Management;Meal Prep;Occupation;Personal Finances;Shop;Yard Work    Rehab Potential Fair    PT Frequency 1x / week    PT Duration 12 weeks    PT Treatment/Interventions ADLs/Self Care Home Management;Aquatic Therapy;Cryotherapy;Electrical Stimulation;Moist Heat;Ultrasound;DME Instruction;Gait training;Stair training;Functional mobility  training;Therapeutic activities;Therapeutic exercise;Balance training;Neuromuscular re-education;Patient/family education;Wheelchair mobility training;Manual techniques;Passive range of motion;Dry needling;Splinting;Orthotic Fit/Training;Taping    PT Next Visit Plan Instruct in LE Strengthening for Home program, continue POC as previously indicated, balance, strength    PT Home Exercise Plan no updates    Consulted and Agree with Plan of Care Patient;Family member/caregiver    Family Member Consulted Sister- Jonathan Newman             Patient will benefit from skilled therapeutic intervention in order to improve the following deficits and impairments:  Abnormal gait, Decreased activity tolerance, Decreased balance, Decreased cognition, Decreased coordination, Decreased endurance, Decreased knowledge of use of DME, Decreased mobility, Decreased range of motion, Decreased safety awareness, Decreased strength, Difficulty walking, Hypomobility, Impaired perceived functional ability, Impaired flexibility, Impaired sensation, Impaired UE functional use  Visit Diagnosis: Muscle weakness (generalized)  Unsteadiness on feet  Other lack of coordination  Other abnormalities of gait and mobility     Problem List Patient Active Problem List   Diagnosis Date Noted   Status post placement of implantable loop recorder 11/02/2020   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 [place], [place], [address] Phone: [phone]   Fax:  [phone]  Name: Isack Lavalley MRN: [id number] Date of Birth: 04-02-1960

## 2021-01-24 ENCOUNTER — Ambulatory Visit: Payer: Medicaid Other | Admitting: Speech Pathology

## 2021-01-24 ENCOUNTER — Ambulatory Visit: Payer: Medicaid Other

## 2021-01-30 ENCOUNTER — Ambulatory Visit: Payer: Medicaid Other | Admitting: Speech Pathology

## 2021-01-30 ENCOUNTER — Ambulatory Visit: Payer: Medicaid Other | Attending: Family Medicine | Admitting: Physical Therapy

## 2021-01-30 ENCOUNTER — Other Ambulatory Visit: Payer: Self-pay

## 2021-01-30 DIAGNOSIS — R4701 Aphasia: Secondary | ICD-10-CM | POA: Diagnosis present

## 2021-01-30 DIAGNOSIS — M6281 Muscle weakness (generalized): Secondary | ICD-10-CM | POA: Diagnosis present

## 2021-01-30 DIAGNOSIS — R2681 Unsteadiness on feet: Secondary | ICD-10-CM

## 2021-01-30 DIAGNOSIS — R482 Apraxia: Secondary | ICD-10-CM

## 2021-01-30 DIAGNOSIS — R41841 Cognitive communication deficit: Secondary | ICD-10-CM | POA: Diagnosis present

## 2021-01-30 DIAGNOSIS — R2689 Other abnormalities of gait and mobility: Secondary | ICD-10-CM | POA: Diagnosis present

## 2021-01-30 DIAGNOSIS — R262 Difficulty in walking, not elsewhere classified: Secondary | ICD-10-CM

## 2021-01-30 NOTE — Therapy (Signed)
[facility] MAIN [facility] SERVICES [address] [place], [place], [address] Phone: [phone]   Fax:  [phone]  Physical Therapy Treatment  Patient Details  Name: Jonathan Newman MRN: [id number] Date of Birth: Jul 17, 1960 No data recorded  Encounter Date: 01/30/2021   PT End of Session - 01/30/21 1632     Visit Number 13    Number of Visits 25    Date for PT Re-Evaluation 01/17/21    Authorization Type Medicaid=Authorized combined PT/OT/SLP 27 visits    Authorization Time Period 9 authorized 10/31-12/31    Progress Note Due on Visit 20    PT Start Time 1516    PT Stop Time 1600    PT Time Calculation (min) 44 min    Equipment Utilized During Treatment Gait belt    Activity Tolerance Patient tolerated treatment well    Behavior During Therapy Impulsive;WFL for tasks assessed/performed             Past Medical History:  Diagnosis Date   Constipation    CVA (cerebral vascular accident) (HCC)    Diarrhea    Dysarthria and anarthria    Hemiplegia and hemiparesis following cerebral infarction affecting right dominant side (HCC)    Hyperlipidemia    Pain in right leg     No past surgical history on file.  There were no vitals filed for this visit.   Subjective Assessment - 01/30/21 1522     Subjective Pt caregiver reports last night he had an episode where she felt like he was having a small stroke where he felt like he didn't know where he was. reports no change in BP or other medical changes and as of now his symptoms have resolved. Encuraged to continue to monitor this condition.    Patient is accompained by: Family member   Sister Jonathan Newman   Pertinent History Patient is a 62 year old male with recent history of L MCA stroke in Feb 2021.Per Dr. 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 Had developed DVT afterwards. Patient is present with sister, Jonathan Newman today who reports he has been living with her since May 2022 and uses his electric w/c in the home an has not walked since CVA- has manual w/c that he uses for community outings. She reports he requires assist with all ADLs and is impulsive but does require assist with transfers for safety- but states he does go to bathroom on his own at times.    Limitations Lifting;Standing;Walking;Writing;Reading;House hold activities    How long can you sit comfortably? no issues or limits    How long can you stand comfortably? <    How long can you walk comfortably? Has not walked since CVA    Patient Stated Goals Caregiver goal- to be as independent and do as much as he can on his own.    Pain Onset In the past 7 days             Exercise/Activity Sets/Reps/Time/ Resistance Assistance Charge type Comments  Nustep  L0 2 x 3 min (No UE Assist)    Min A from pt to maintain R LE alignment  TherEX To isolate R LE movement   Transfers  x2 CGA therACT Cues for positioning, no LOB noted, moved to/from WC to mat table and to nustep  without LOB. Unable to obtain full stand pivot.   Standing trial  X 1:35 seconds  2 X CGA  There ACT Cues for weight shift, longest stand since eval   LAQ 3 x 10 R LE  Assist from PT to prevent hip hiking (max A) therEX With PT manually preventing hip flexion pt able to perform active quad contraction through approximately 50% of available ROM.  -significant improvement from previous sessions.   Weight shifting in standing  1 sets of 2 reps 1 set of 3 reps  Max A for R LE placement  TherACT                     Hamstring curls 2 x 10  Max A to place knee in extension  There EX -Max A to place knee in extension followed by cues for hamstring muscle activation               Treatment Provided this session   Pt educated throughout session about proper posture and  technique with exercises. Improved exercise technique, movement at target joints, use of target muscles after min to mod verbal, visual, tactile cues.  Note: Portions of this document were prepared using Dragon voice recognition software and although reviewed may contain unintentional dictation errors in syntax, grammar, or spelling.                             PT Education - 01/30/21 1632     Education Details Progress with therapy    Person(s) Educated Patient    Methods Explanation    Comprehension Verbalized understanding              PT Short Term Goals - 01/30/21 1635       PT SHORT TERM GOAL #1   Title Pt will be independent with HEP in order to improve strength and balance in order to decrease fall risk and improve function at home and work.    Baseline 10/25/2020= Patient has no formal HEP in place12/8: Pt caregiver report sHEP has been going well.    Time 6    Period Weeks    Status Achieved    Target Date 12/06/20      PT SHORT TERM GOAL #2   Title Patient will perform all sit to stand transfers with CGA for improved functional mobility and decreased dependence on caregivers.    Baseline 10/25/2020=Patient requires mod assist with all sit to stand transfers, 12/8: sister reports improved independence with most transfers, some guarding still necessary for safety, deomnstrates ability to transfer to/from nuscetp and WC as well as to/from mat table.    Time 6    Period Weeks    Status Achieved    Target Date 12/06/20      PT SHORT TERM GOAL #3   Title Patient perform 5 time sit to stand in 50 seconds or less in order to indicate improved lower extremity strength and stability.    Baseline 01/30/21 Able to complete 2 sit to stands but only able to utilize left lower extremity has difficulty with balance upon standing.    Time 12    Period Weeks    Status New    Target Date 04/24/21               PT Long Term Goals - 01/30/21 1536        PT LONG TERM GOAL #1  Title Pt will improve FOTO to target score of 43  to display perceived improvements in ability to complete ADL's.    Baseline 10/25/2020= 29 12/8: 33.1 01/30/21: 36    Time 12    Period Weeks    Status Revised    Target Date 03/22/21      PT LONG TERM GOAL #2   Title Patient will perform stand pivot transfer with supervision from all surfaces without loss of balance or VC for safety.    Baseline 10/25/2020- Patient requires Mod assist for safe transfers with min ability to place any weight through right LE.01/30/21: able to perform without assistance but still requiries supervision as well as UE assistance on surfaces    Time 12    Period Weeks    Status On-going    Target Date 04/24/21      PT LONG TERM GOAL #3   Title Patient will perform static stand with least restrictive assistive device > 2 min with > 25% weight bearing through right LE without LOB.    Baseline 12/8: able to stand for approximately 30 sec with R LE on airex pad and Min WB on the R LE and ModA from 2x PT for LE placement and to assist with weight shifting, 01/31/20: stands for 1:25 with CGA from PT for balance and with min weight through R LE (foot is on floor)    Time 12    Period Weeks    Status On-going    Target Date 03/22/21      PT LONG TERM GOAL #4   Title Patient will perform 5 times sit to stand test and 40 seconds or less in order to indicate increased lower extremity power and ability to perform transfers.    Baseline 1/10/23There were to perform several repetitions of sit to stand and unable to bear weight through left lower extremity and also signs of unsteadiness when going to standing position.    Time 12    Period Weeks    Status New    Target Date 04/24/21                   Plan - 01/30/21 1633     Clinical Impression Statement Patient demonstrates excellent motivation for completion of physical therapy program.  Patient makes good progress with NuStep activity  able to complete without use of upper extremity indicating glutes and quad muscle activation on the right lower extremity.  Patient does require some manual assistance to keep right lower extremity in proper alignment during this activity.  Patient made progress toward all of his long-term goals and is achieved all of his short-term goals at this time.  Mobic goals are being written in order to continue to promote patient progress.  Patient's caregiver reports significant improvement with ability to transfer within the home as well as his overall strength.  Patient does continue to have difficulty with right lower extremity weightbearing particular dressing right lower extremity to support him and weightbearing.  We will continue to work on right lower extremity strength as well as competence of the right lower extremity weightbearing in future sessions.  Patient will continue to benefit from skilled physical therapy intervention in order to improve his lower extremity strength, transfer ability, with goal of improving patient's ambulatory capacity.    Personal Factors and Comorbidities Comorbidity 3+;Time since onset of injury/illness/exacerbation    Comorbidities CVA, Aphasia, Hyperlipidemia    Examination-Activity Limitations Bathing;Bed Mobility;Bend;Caring for Others;Carry;Lift;Reach Overhead;Squat;Stairs;Stand;Toileting;Transfers    Examination-Participation  Restrictions Cleaning;Community Activity;Driving;Laundry;Medication Management;Meal Prep;Occupation;Personal Finances;Shop;Yard Work    Rehab Potential Fair    PT Frequency 1x / week    PT Duration 12 weeks    PT Treatment/Interventions ADLs/Self Care Home Management;Aquatic Therapy;Cryotherapy;Electrical Stimulation;Moist Heat;Ultrasound;DME Instruction;Gait training;Stair training;Functional mobility training;Therapeutic activities;Therapeutic exercise;Balance training;Neuromuscular re-education;Patient/family education;Wheelchair mobility  training;Manual techniques;Passive range of motion;Dry needling;Splinting;Orthotic Fit/Training;Taping    PT Next Visit Plan continue POC as previously indicated, balance, strength    PT Home Exercise Plan no updates    Consulted and Agree with Plan of Care Patient;Family member/caregiver    Family Member Consulted Sister- Jonathan Newman             Patient will benefit from skilled therapeutic intervention in order to improve the following deficits and impairments:  Abnormal gait, Decreased activity tolerance, Decreased balance, Decreased cognition, Decreased coordination, Decreased endurance, Decreased knowledge of use of DME, Decreased mobility, Decreased range of motion, Decreased safety awareness, Decreased strength, Difficulty walking, Hypomobility, Impaired perceived functional ability, Impaired flexibility, Impaired sensation, Impaired UE functional use  Visit Diagnosis: Muscle weakness (generalized)  Unsteadiness on feet  Other abnormalities of gait and mobility  Difficulty in walking, not elsewhere classified     Problem List Patient Active Problem List   Diagnosis Date Noted   Status post placement of implantable loop recorder 11/02/2020   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

## 2021-01-31 NOTE — Therapy (Signed)
[place] MAIN [facility] SERVICES [address] [place], [place], [address] Phone: [phone]   Fax:  [phone]  Speech Language Pathology Treatment and Recertification  Patient Details  Name: Jonathan Newman MRN: [id number] Date of Birth: [birth year] Referring Provider (SLP): Dr. Jennings Books   Encounter Date: 01/30/2021   End of Session - 01/31/21 0822     Visit Number 13    Number of Visits 15    Date for SLP Re-Evaluation 04/30/21    Authorization Type Medicaid - 27 visits combined for SLP/PT; 3 visits auth 01/24/21-02/02/21    Authorization - Visit Number 1    Authorization - Number of Visits 3    Progress Note Due on Visit 10    SLP Start Time 1600    SLP Stop Time  1700    SLP Time Calculation (min) 60 min    Activity Tolerance Patient tolerated treatment well             Past Medical History:  Diagnosis Date   Constipation    CVA (cerebral vascular accident) ([place])    Diarrhea    Dysarthria and anarthria    Hemiplegia and hemiparesis following cerebral infarction affecting right dominant side ([place])    Hyperlipidemia    Pain in right leg     No past surgical history on file.  There were no vitals filed for this visit.   Subjective Assessment - 01/31/21 0814     Subjective Pt's permanent Lingraphica device arrived last week.    Patient is accompained by: Family member   sister [name]   Currently in Pain? No/denies                   ADULT SLP TREATMENT - 01/31/21 0814       General Information   Behavior/Cognition Alert;Cooperative;Decreased sustained attention    HPI Jonathan Newman is a 61 y.o. male referred by Dr. Manuella Ghazi for SLP evaluation. Hx noted for DVT, HTN Pt with reported CVA in February 2021 (records from OSH are unavailable), with subsequent rehabilitation in [place]. Pt now living with his sister in [place]. Repeat MRI brain on 09/29/20 showed "Large chronic left MCA territory and left MCA/PCA watershed  territory cortical/subcortical infarct, as described. Superimposed chronic perforator infarct within the left centrum semiovale/corona radiata and left basal ganglia. Associated wallerian degeneration on the left." Chronic infarcts also within left thalamus and bilateral pons, and mild generalized cerebral and cerebellar atrophy.      Cognitive-Linquistic Treatment   Treatment focused on Aphasia    Skilled Treatment Pt missed some visits due to illness in December. Arrives with Lingraphica device, nodded to acknowledge he was excited to receive this. Pt navigated independently to "family " section and pointed at icons without pictures, indicating he wanted to add photos. Pt participated in icon editing with initial moderate cues fading to rare min A, adding photos of ~10 family members. Pt intermittently distractible, navigating quickly between topics (phone, food, his dog Javi) but was redirectable with min cues. Device customization to assist with making simple phone calls. Pt able to role play telephone call with minimal cues. Pt successful with using combination of icon selection on his device with vocalization/facial expression to indicate feelings about friends/family, life events, and food preferences. SLP facilitated simple communication exchange by directing patient in introducing himself to graduate student clinican and sharing personal details.      Assessment / Recommendations / Plan   Plan Continue with current plan of  care      Progression Toward Goals   Progression toward goals Progressing toward goals              SLP Education - 01/31/21 0821     Education Details icon editing, preventing deletion    Person(s) Educated Patient;Caregiver(s)    Methods Explanation;Demonstration    Comprehension Returned demonstration;Need further instruction              SLP Short Term Goals - 01/31/21 0827       SLP SHORT TERM GOAL #1   Title Patient will comprehend simple Y/N  questions >80% accuracy with written cues/ AAC support.    Baseline 60%    Time 5    Period Weeks    Status Achieved      SLP SHORT TERM GOAL #2   Title Patient will indicate when he does not comprehend in >80% of trials (word to sentence level), using facial expression, gesture, or AAC.    Baseline 0%    Time 5    Period Weeks    Status Partially Met      SLP SHORT TERM GOAL #3   Title Patient will answer simple biographical questions 80% accuracy (Y/N questions or AAC).    Baseline 80% with extended time, repetition, supportive conversation strategies    Time 5    Period Weeks    Status Partially Met      SLP SHORT TERM GOAL #4   Title Per pt/caregiver report, patient will use AAC to make request related to physical needs or outings at least 5/7 days with min cues.    Baseline Needs max cues outside of therapy    Time 7    Period --   sessions   Status New    Target Date --   by visit 56     SLP SHORT TERM GOAL #5   Title Pt will use AAC to reduce frustration by relaying feelings or needs during a communication breakdown with min cues, per pt/family report.    Baseline Patient has emotional outbursts    Time 7    Period --   sessions   Status New    Target Date --   by visit 20             SLP Long Term Goals - 01/31/21 0932       SLP LONG TERM GOAL #1   Title Pt will communicate emergency information 100% accuracy, using AAC if necessary.    Baseline with moderate cues; progressing, will continue    Time 12    Period Weeks    Status On-going    Target Date 04/30/21      SLP LONG TERM GOAL #2   Title Patient will reduce frustration by communicating thoughts/feelings with AAC in 80% of opportunities.    Baseline beginning to combine facial expressions with use of AAC icons; progressing, will continue    Time 12    Period Weeks    Status On-going    Target Date 04/30/21      SLP LONG TERM GOAL #3   Title Pt will successfully convey requests for outings/  personal needs outside of ST >75% of the time, using AAC if necessary.    Baseline able to do so in therapy sessions; ongoing training necessary for carryover at home    Time 12    Period Weeks    Status On-going    Target Date 04/30/21  SLP LONG TERM GOAL #4   Title Pt will reduce social isolation risk by participating in simple conversational exchange (3-5 minutes), using AAC.    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 Visual cues (AAC icons) appear to increase pt's comprehension; pt demonstrating emerging ability to navigate communication device to communicate basic wants/needs. Pt navigating between topics fluidly but at times needs redirection due to attention/distraction. Appears enthusiastic and eager to communicate thoughts, ideas and feeling and is demonstrating combining use of AAC as well as vocalizations, gestures and facial expressions to augment communication. Progressing toward LTGs, new STGs added and recertification completed today. I recommend skilled ST with focus on improving functional communication, trialing use of AAC to determine best way to support pt in communicating/comprehending.    Speech Therapy Frequency 2x / week    Duration 12 weeks   ST limited to 14 visits this year due to Medicaid visit limit (27 combined)   Treatment/Interventions Language facilitation;Environmental controls;Cueing hierarchy;SLP instruction and feedback;Compensatory techniques;Cognitive reorganization;Functional tasks;Compensatory strategies;Patient/family education;Internal/external aids;Multimodal communcation approach    Potential to Achieve Goals Good   with AAC   Potential Considerations Severity of impairments;Financial resources   has  good family support, appears highly motivated   Consulted and Agree with Plan of Care Patient             Patient will benefit from skilled therapeutic intervention in order to improve the following deficits and impairments:   Aphasia  Verbal apraxia  Cognitive communication deficit    Problem List Patient Active Problem List   Diagnosis Date Noted   Status post placement of implantable loop recorder 11/02/2020   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

## 2021-02-01 ENCOUNTER — Ambulatory Visit: Payer: Medicaid Other | Admitting: Physical Therapy

## 2021-02-01 ENCOUNTER — Ambulatory Visit: Payer: Medicaid Other | Admitting: Speech Pathology

## 2021-02-01 ENCOUNTER — Other Ambulatory Visit: Payer: Self-pay

## 2021-02-01 DIAGNOSIS — R4701 Aphasia: Secondary | ICD-10-CM

## 2021-02-01 DIAGNOSIS — R2689 Other abnormalities of gait and mobility: Secondary | ICD-10-CM

## 2021-02-01 DIAGNOSIS — R482 Apraxia: Secondary | ICD-10-CM

## 2021-02-01 DIAGNOSIS — M6281 Muscle weakness (generalized): Secondary | ICD-10-CM | POA: Diagnosis not present

## 2021-02-01 DIAGNOSIS — R41841 Cognitive communication deficit: Secondary | ICD-10-CM

## 2021-02-01 DIAGNOSIS — R2681 Unsteadiness on feet: Secondary | ICD-10-CM

## 2021-02-01 DIAGNOSIS — R262 Difficulty in walking, not elsewhere classified: Secondary | ICD-10-CM

## 2021-02-01 NOTE — Therapy (Signed)
[facility] MAIN [facility] SERVICES [address] [place], [place], [address] Phone: [phone]   Fax:  [phone]  Physical Therapy Treatment  Patient Details  Name: Jonathan Newman MRN: [id number] Date of Birth: [birth year] No data recorded  Encounter Date: 02/01/2021   PT End of Session - 02/01/21 1530     Visit Number 14    Number of Visits 25    Date for PT Re-Evaluation 01/17/21    Authorization Type Medicaid=Authorized combined PT/OT/SLP 27 visits    Authorization Time Period 9 authorized 10/31-12/31    Progress Note Due on Visit 20    PT Start Time 1515    PT Stop Time 1600    PT Time Calculation (min) 45 min    Equipment Utilized During Treatment Gait belt    Activity Tolerance Patient tolerated treatment well    Behavior During Therapy Impulsive;WFL for tasks assessed/performed             Past Medical History:  Diagnosis Date   Constipation    CVA (cerebral vascular accident) (HCC)    Diarrhea    Dysarthria and anarthria    Hemiplegia and hemiparesis following cerebral infarction affecting right dominant side (HCC)    Hyperlipidemia    Pain in right leg     No past surgical history on file.  There were no vitals filed for this visit.   Subjective Assessment - 02/01/21 1527     Subjective Pt reports doing well sicne last visit. Reports no changes since last session.    Patient is accompained by: Family member   Sister [name]   Pertinent History Patient is a 61 year old male with recent history of L MCA stroke in Feb 2021.Per Dr. 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 Had developed DVT afterwards. Patient is present with sister, [name] today who reports he has been living with her since May 2022 and uses his electric w/c in the  home an has not walked since CVA- has manual w/c that he uses for community outings. She reports he requires assist with all ADLs and is impulsive but does require assist with transfers for safety- but states he does go to bathroom on his own at times.    Limitations Lifting;Standing;Walking;Writing;Reading;House hold activities    How long can you sit comfortably? no issues or limits    How long can you stand comfortably? <    How long can you walk comfortably? Has not walked since CVA    Patient Stated Goals Caregiver goal- to be as independent and do as much as he can on his own.    Currently in Pain? No/denies    Pain Onset In the past 7 days               Exercise/Activity Sets/Reps/Time/ Resistance Assistance Charge type Comments  Transfers  x4 Min a therACT Performed throughout session. Cuing to attend to task/slow speed/LE positioning.  Nustep  LE only  3 min level 0 3 min level 1  minA for hip alignment  therEX  Break at 3 min, no UE asssit indicating LE muscle recruitment on the R LE   LAQ 10x no weight RLE;   modA for movement TherEX PT assists with RLE concentric and eccentric. Cuing to focus on eccentric component  Weight shifting  in standing with lateral rocks 2 x 5  U UE Neuro   The following activities were completed in parallel bars or at balance bar    Leg press  25# X isomeric on the R LE, limited pressure put through LE without assistance     TherEX Cuing to push through RLE, cuing for breathing technique, TC for proper hip alignment    Exercise/Activity Sets/Reps/Time/ Resistance Assistance Charge type Comments  Leg press B LE with staggered stance to incrase R LE muscle recruitment  2 x 10 @ 40# minA for R LE alignment in hip  TherEX - cues for staggering of LEs in order to increase R LE muscle recruitment  -Cues for breathing and pacing   Weight shifting in standing (A/P)  2 x 5  U UE and CGA  TherACT The following activities were completed in parallel bars  or at balance bar  -cues for LE placement  -to improve weight acceptance to the R LE  Step taps (RLE) 2 x 8 ea  U UE, CGA  ThereACT -cues for hip flexion to complete activity as pt attempts to compensate with trunk flexxion 4 inch step -Pt quick to fatigue with this activity                                                   Treatment Provided this session   Pt educated throughout session about proper posture and technique with exercises. Improved exercise technique, movement at target joints, use of target muscles after min to mod verbal, visual, tactile cues. Note: Portions of this document were prepared using Dragon voice recognition software and although reviewed may contain unintentional dictation errors in syntax, grammar, or spelling.   Cues for breathing throughout all of his exercises, pt tends to utilize valsalva maneuver, despite frequent cues pt always reverts to utilizing valsava maneuver                            PT Education - 02/01/21 1604     Education Details R LE movement mechanics              PT Short Term Goals - 01/30/21 1635       PT SHORT TERM GOAL #1   Title Pt will be independent with HEP in order to improve strength and balance in order to decrease fall risk and improve function at home and work.    Baseline 10/25/2020= Patient has no formal HEP in place12/8: Pt caregiver report sHEP has been going well.    Time 6    Period Weeks    Status Achieved    Target Date 12/06/20      PT SHORT TERM GOAL #2   Title Patient will perform all sit to stand transfers with CGA for improved functional mobility and decreased dependence on caregivers.    Baseline 10/25/2020=Patient requires mod assist with all sit to stand transfers, 12/8: sister reports improved independence with most transfers, some guarding still necessary for safety, deomnstrates ability to transfer to/from nuscetp and WC as well as to/from mat table.    Time 6     Period Weeks    Status Achieved    Target Date 12/06/20      PT SHORT TERM GOAL #3   Title Patient perform 5  time sit to stand in 50 seconds or less in order to indicate improved lower extremity strength and stability.    Baseline 01/30/21 Able to complete 2 sit to stands but only able to utilize left lower extremity has difficulty with balance upon standing.    Time 12    Period Weeks    Status New    Target Date 04/24/21               PT Long Term Goals - 01/30/21 1536       PT LONG TERM GOAL #1   Title Pt will improve FOTO to target score of 43  to display perceived improvements in ability to complete ADL's.    Baseline 10/25/2020= 29 12/8: 33.1 01/30/21: 36    Time 12    Period Weeks    Status Revised    Target Date 03/22/21      PT LONG TERM GOAL #2   Title Patient will perform stand pivot transfer with supervision from all surfaces without loss of balance or VC for safety.    Baseline 10/25/2020- Patient requires Mod assist for safe transfers with min ability to place any weight through right LE.01/30/21: able to perform without assistance but still requiries supervision as well as UE assistance on surfaces    Time 12    Period Weeks    Status On-going    Target Date 04/24/21      PT LONG TERM GOAL #3   Title Patient will perform static stand with least restrictive assistive device > 2 min with > 25% weight bearing through right LE without LOB.    Baseline 12/8: able to stand for approximately 30 sec with R LE on airex pad and Min WB on the R LE and ModA from 2x PT for LE placement and to assist with weight shifting, 01/31/20: stands for 1:25 with CGA from PT for balance and with min weight through R LE (foot is on floor)    Time 12    Period Weeks    Status On-going    Target Date 03/22/21      PT LONG TERM GOAL #4   Title Patient will perform 5 times sit to stand test and 40 seconds or less in order to indicate increased lower extremity power and ability to perform  transfers.    Baseline 1/10/23There were to perform several repetitions of sit to stand and unable to bear weight through left lower extremity and also signs of unsteadiness when going to standing position.    Time 12    Period Weeks    Status New    Target Date 04/24/21                   Plan - 02/01/21 1531     Clinical Impression Statement Pt demonstrates excellent motivation for completion of PT session. Pt able to perform several exercises in // bars this session. Progressed with weightbearing tolerance for R LE and also progressed with performing squatting activities on the R LE. Completed stepping activities involving hip flexion and placement of the R LE on steps this session and pt able to complete for a few reps but fatigues very quickly with this activity. With leg press activity today pt unable to push significant weight through R LE and was unable to lift over 25# this session even with assistance from PT. Pt will continue to benefit from skilled PT interventions in order to improve LE strength, function, transfer ability and  overall function.    Personal Factors and Comorbidities Comorbidity 3+;Time since onset of injury/illness/exacerbation    Comorbidities CVA, Aphasia, Hyperlipidemia    Examination-Activity Limitations Bathing;Bed Mobility;Bend;Caring for Others;Carry;Lift;Reach Overhead;Squat;Stairs;Stand;Toileting;Transfers    Examination-Participation Restrictions Cleaning;Community Activity;Driving;Laundry;Medication Management;Meal Prep;Occupation;Personal Finances;Shop;Yard Work    Rehab Potential Fair    PT Frequency 1x / week    PT Duration 12 weeks    PT Treatment/Interventions ADLs/Self Care Home Management;Aquatic Therapy;Cryotherapy;Electrical Stimulation;Moist Heat;Ultrasound;DME Instruction;Gait training;Stair training;Functional mobility training;Therapeutic activities;Therapeutic exercise;Balance training;Neuromuscular re-education;Patient/family  education;Wheelchair mobility training;Manual techniques;Passive range of motion;Dry needling;Splinting;Orthotic Fit/Training;Taping    PT Next Visit Plan continue POC as previously indicated, balance, strength    PT Home Exercise Plan no updates    Consulted and Agree with Plan of Care Patient;Family member/caregiver    Family Member Consulted Sister- [name]             Patient will benefit from skilled therapeutic intervention in order to improve the following deficits and impairments:  Abnormal gait, Decreased activity tolerance, Decreased balance, Decreased cognition, Decreased coordination, Decreased endurance, Decreased knowledge of use of DME, Decreased mobility, Decreased range of motion, Decreased safety awareness, Decreased strength, Difficulty walking, Hypomobility, Impaired perceived functional ability, Impaired flexibility, Impaired sensation, Impaired UE functional use  Visit Diagnosis: Unsteadiness on feet  Other abnormalities of gait and mobility  Difficulty in walking, not elsewhere classified     Problem List Patient Active Problem List   Diagnosis Date Noted   Status post placement of implantable loop recorder 11/02/2020   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 [place], [place], [address] Phone: [phone]   Fax:  [phone]  Name: Jonathan Newman MRN: [id number] Date of Birth: [birth year]

## 2021-02-01 NOTE — Therapy (Signed)
[place] MAIN [facility] SERVICES [address] [place], [place], [address] Phone: [phone]   Fax:  [phone]  Speech Language Pathology Treatment  Patient Details  Name: Jonathan Newman MRN: [id number] Date of Birth: [birth year] Referring Provider (SLP): Dr. Jennings Books   Encounter Date: 02/01/2021   End of Session - 02/01/21 1656     Visit Number 14    Number of Visits 15    Date for SLP Re-Evaluation 04/30/21    Authorization Type Medicaid - 27 visits combined for SLP/PT; 3 visits auth 01/24/21-02/02/21    Authorization - Visit Number 2    Authorization - Number of Visits 3    Progress Note Due on Visit 10    SLP Start Time 1600    SLP Stop Time  1650    SLP Time Calculation (min) 50 min    Activity Tolerance Patient tolerated treatment well             Past Medical History:  Diagnosis Date   Constipation    CVA (cerebral vascular accident) ([place])    Diarrhea    Dysarthria and anarthria    Hemiplegia and hemiparesis following cerebral infarction affecting right dominant side ([place])    Hyperlipidemia    Pain in right leg     No past surgical history on file.  There were no vitals filed for this visit.   Subjective Assessment - 02/01/21 1649     Subjective Sister is working with company to get wheelchair mount for pt's device    Patient is accompained by: Family member   sister Jonathan Newman   Currently in Pain? No/denies                   ADULT SLP TREATMENT - 02/01/21 1653       General Information   Behavior/Cognition Alert;Cooperative;Decreased sustained attention    HPI Jonathan Newman is a 61 y.o. male referred by Dr. Manuella Ghazi for SLP evaluation. Hx noted for DVT, HTN Pt with reported CVA in February 2021 (records from OSH are unavailable), with subsequent rehabilitation in [place]. Pt now living with his sister in [place]. Repeat MRI brain on 09/29/20 showed "Large chronic left MCA territory and left MCA/PCA watershed  territory cortical/subcortical infarct, as described. Superimposed chronic perforator infarct within the left centrum semiovale/corona radiata and left basal ganglia. Associated wallerian degeneration on the left." Chronic infarcts also within left thalamus and bilateral pons, and mild generalized cerebral and cerebellar atrophy.      Cognitive-Linquistic Treatment   Treatment focused on Aphasia    Skilled Treatment Pt used Lingraph[employer] device to tell SLP who he called on the phone (sisters and 2 friends). Worked with pt and sister to ID additional questions/comments to add to phone session to encourage more conversation with friends and family. Pt used device to indicate what he would like to eat after therapy today, as well as to indicate pain level with mod I. Occasional redirection to topic necessary. Demonstrated location of questions to prompt/initiate conversation; pt used icons and then eye contact to ask [facility] questions of graduate clinician and SLP with min cues.      Assessment / Recommendations / Plan   Plan Continue with current plan of care      Progression Toward Goals   Progression toward goals Progressing toward goals              SLP Education - 02/01/21 1654     Education Details using device to  ask questions    Person(s) Educated Patient;Caregiver(s)    Methods Explanation;Demonstration    Comprehension Verbalized understanding;Returned demonstration              SLP Short Term Goals - 01/31/21 0827       SLP SHORT TERM GOAL #1   Title Patient will comprehend simple Y/N questions >80% accuracy with written cues/ AAC support.    Baseline 60%    Time 5    Period Weeks    Status Achieved      SLP SHORT TERM GOAL #2   Title Patient will indicate when he does not comprehend in >80% of trials (word to sentence level), using facial expression, gesture, or AAC.    Baseline 0%    Time 5    Period Weeks    Status Partially Met      SLP SHORT TERM GOAL #3   Title  Patient will answer simple biographical questions 80% accuracy (Y/N questions or AAC).    Baseline 80% with extended time, repetition, supportive conversation strategies    Time 5    Period Weeks    Status Partially Met      SLP SHORT TERM GOAL #4   Title Per pt/caregiver report, patient will use AAC to make request related to physical needs or outings at least 5/7 days with min cues.    Baseline Needs max cues outside of therapy    Time 7    Period --   sessions   Status New    Target Date --   by visit 23     SLP SHORT TERM GOAL #5   Title Pt will use AAC to reduce frustration by relaying feelings or needs during a communication breakdown with min cues, per pt/family report.    Baseline Patient has emotional outbursts    Time 7    Period --   sessions   Status New    Target Date --   by visit 20             SLP Long Term Goals - 01/31/21 7681       SLP LONG TERM GOAL #1   Title Pt will communicate emergency information 100% accuracy, using AAC if necessary.    Baseline with moderate cues; progressing, will continue    Time 12    Period Weeks    Status On-going    Target Date 04/30/21      SLP LONG TERM GOAL #2   Title Patient will reduce frustration by communicating thoughts/feelings with AAC in 80% of opportunities.    Baseline beginning to combine facial expressions with use of AAC icons; progressing, will continue    Time 12    Period Weeks    Status On-going    Target Date 04/30/21      SLP LONG TERM GOAL #3   Title Pt will successfully convey requests for outings/ personal needs outside of ST >75% of the time, using AAC if necessary.    Baseline able to do so in therapy sessions; ongoing training necessary for carryover at home    Time 12    Period Weeks    Status On-going    Target Date 04/30/21      SLP LONG TERM GOAL #4   Title Pt will reduce social isolation risk by participating in simple conversational exchange (3-5 minutes), using AAC.     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 Visual cues (AAC icons) appear to increase pt's comprehension; pt demonstrating emerging ability to navigate communication device to communicate basic wants/needs. Pt navigating between topics fluidly but at times needs redirection due to attention/distraction. Appears enthusiastic and eager to communicate thoughts, ideas and feeling and is demonstrating combining use of AAC as well as vocalizations, gestures and facial expressions to augment communication. I recommend skilled ST with focus on improving functional communication, trialing use of AAC to determine best way to support pt in communicating/comprehending.    Speech Therapy Frequency 2x / week    Duration 12 weeks   ST limited to 14 visits this year due to Medicaid visit limit (27 combined)   Treatment/Interventions Language facilitation;Environmental controls;Cueing hierarchy;SLP instruction and feedback;Compensatory techniques;Cognitive reorganization;Functional tasks;Compensatory strategies;Patient/family education;Internal/external aids;Multimodal communcation approach    Potential to Achieve Goals Good   with AAC   Potential Considerations Severity of impairments;Financial resources   has good family support, appears highly motivated   Consulted and Agree with Plan of Care Patient             Patient will benefit from skilled therapeutic intervention in order to improve the following deficits and impairments:   Aphasia  Verbal apraxia  Cognitive communication deficit    Problem List Patient Active Problem List   Diagnosis Date Noted   Status post placement of implantable loop recorder 11/02/2020   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 [place], [place], [address] Phone: [phone]   Fax:  [phone]   Name: Jonathan Newman MRN: [id number] Date of Birth: Nov 28, 1960

## 2021-02-06 ENCOUNTER — Ambulatory Visit: Payer: Medicaid Other | Admitting: Speech Pathology

## 2021-02-06 ENCOUNTER — Other Ambulatory Visit: Payer: Self-pay

## 2021-02-06 ENCOUNTER — Ambulatory Visit: Payer: Medicaid Other | Admitting: Physical Therapy

## 2021-02-06 DIAGNOSIS — R2689 Other abnormalities of gait and mobility: Secondary | ICD-10-CM

## 2021-02-06 DIAGNOSIS — R41841 Cognitive communication deficit: Secondary | ICD-10-CM

## 2021-02-06 DIAGNOSIS — R4701 Aphasia: Secondary | ICD-10-CM

## 2021-02-06 DIAGNOSIS — R2681 Unsteadiness on feet: Secondary | ICD-10-CM

## 2021-02-06 DIAGNOSIS — M6281 Muscle weakness (generalized): Secondary | ICD-10-CM | POA: Diagnosis not present

## 2021-02-06 DIAGNOSIS — R262 Difficulty in walking, not elsewhere classified: Secondary | ICD-10-CM

## 2021-02-06 DIAGNOSIS — R482 Apraxia: Secondary | ICD-10-CM

## 2021-02-06 NOTE — Therapy (Signed)
[place] MAIN [facility] SERVICES [address] [place], [place], [address] Phone: [phone]   Fax:  [phone]  Speech Language Pathology Treatment  Patient Details  Name: Jonathan Newman MRN: [id number] Date of Birth: [birth year] Referring Provider (SLP): Dr. Jennings Books   Encounter Date: 02/06/2021   End of Session - 02/06/21 1902     Visit Number 15    Number of Visits 15    Date for SLP Re-Evaluation 04/30/21    Authorization Type Medicaid - 27 visits combined for SLP/PT; 3 visits auth 01/24/21-02/02/21    Authorization - Visit Number 3    Authorization - Number of Visits 3    Progress Note Due on Visit 10    SLP Start Time 1600    SLP Stop Time  1700    SLP Time Calculation (min) 60 min    Activity Tolerance Patient tolerated treatment well             Past Medical History:  Diagnosis Date   Constipation    CVA (cerebral vascular accident) ([place])    Diarrhea    Dysarthria and anarthria    Hemiplegia and hemiparesis following cerebral infarction affecting right dominant side ([place])    Hyperlipidemia    Pain in right leg     No past surgical history on file.  There were no vitals filed for this visit.   Subjective Assessment - 02/06/21 1852     Subjective Sister prompted pt to use device with MD however reports pt could not find what he was trying to say    Currently in Pain? No/denies                   ADULT SLP TREATMENT - 02/06/21 1857       General Information   Behavior/Cognition Alert;Cooperative;Decreased sustained attention    HPI Jonathan Newman is a 61 y.o. male referred by Dr. Manuella Ghazi for SLP evaluation. Hx noted for DVT, HTN Pt with reported CVA in February 2021 (records from OSH are unavailable), with subsequent rehabilitation in [place]. Pt now living with his sister in [place]. Repeat MRI brain on 09/29/20 showed "Large chronic left MCA territory and left MCA/PCA watershed territory cortical/subcortical  infarct, as described. Superimposed chronic perforator infarct within the left centrum semiovale/corona radiata and left basal ganglia. Associated wallerian degeneration on the left." Chronic infarcts also within left thalamus and bilateral pons, and mild generalized cerebral and cerebellar atrophy.      Cognitive-Linquistic Treatment   Treatment focused on Aphasia    Skilled Treatment Based on sister's report of difficulty communicating at MD office, education provided on adding more personally relevant information to assist pt in communicating in these scenarios. Suggested including responses to commonly asked questions as well as pt's medications and medical details. Pt required redirection intermittently, however SLP allowed pt opportunities for topic-shifting/functional communication and demonstrated how to add content to support pt in asking questions.  Pt used combination of gestures and newly programmed icons to ask personal questions of SLP and graduate student clinician.  Pt's sister to assist with adding pt's medications to device.      Assessment / Recommendations / Plan   Plan Continue with current plan of care      Progression Toward Goals   Progression toward goals Progressing toward goals              SLP Education - 02/06/21 1901     Education Details anticipate questions others may ask,  add icons for things pt likes to talk about    Person(s) Educated Patient;Caregiver(s)    Methods Explanation    Comprehension Verbalized understanding              SLP Short Term Goals - 01/31/21 0827       SLP SHORT TERM GOAL #1   Title Patient will comprehend simple Y/N questions >80% accuracy with written cues/ AAC support.    Baseline 60%    Time 5    Period Weeks    Status Achieved      SLP SHORT TERM GOAL #2   Title Patient will indicate when he does not comprehend in >80% of trials (word to sentence level), using facial expression, gesture, or AAC.    Baseline 0%     Time 5    Period Weeks    Status Partially Met      SLP SHORT TERM GOAL #3   Title Patient will answer simple biographical questions 80% accuracy (Y/N questions or AAC).    Baseline 80% with extended time, repetition, supportive conversation strategies    Time 5    Period Weeks    Status Partially Met      SLP SHORT TERM GOAL #4   Title Per pt/caregiver report, patient will use AAC to make request related to physical needs or outings at least 5/7 days with min cues.    Baseline Needs max cues outside of therapy    Time 7    Period --   sessions   Status New    Target Date --   by visit 46     SLP SHORT TERM GOAL #5   Title Pt will use AAC to reduce frustration by relaying feelings or needs during a communication breakdown with min cues, per pt/family report.    Baseline Patient has emotional outbursts    Time 7    Period --   sessions   Status New    Target Date --   by visit 20             SLP Long Term Goals - 01/31/21 1224       SLP LONG TERM GOAL #1   Title Pt will communicate emergency information 100% accuracy, using AAC if necessary.    Baseline with moderate cues; progressing, will continue    Time 12    Period Weeks    Status On-going    Target Date 04/30/21      SLP LONG TERM GOAL #2   Title Patient will reduce frustration by communicating thoughts/feelings with AAC in 80% of opportunities.    Baseline beginning to combine facial expressions with use of AAC icons; progressing, will continue    Time 12    Period Weeks    Status On-going    Target Date 04/30/21      SLP LONG TERM GOAL #3   Title Pt will successfully convey requests for outings/ personal needs outside of ST >75% of the time, using AAC if necessary.    Baseline able to do so in therapy sessions; ongoing training necessary for carryover at home    Time 12    Period Weeks    Status On-going    Target Date 04/30/21      SLP LONG TERM GOAL #4   Title Pt will reduce social isolation  risk by participating in simple conversational exchange (3-5 minutes), using AAC.    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 Visual cues (AAC icons) appear to increase pt's comprehension; pt demonstrating emerging ability to navigate communication device to communicate basic wants/needs. Pt navigating between topics fluidly but at times needs redirection due to attention/distraction. Appears enthusiastic and eager to communicate thoughts, ideas and feeling and is demonstrating combining use of AAC as well as vocalizations, gestures and facial expressions to augment communication. I recommend skilled ST with focus on improving functional communication, trialing use of AAC to determine best way to support pt in communicating/comprehending.    Speech Therapy Frequency 2x / week    Duration 12 weeks   ST limited to 14 visits this year due to Medicaid visit limit (27 combined)   Treatment/Interventions Language facilitation;Environmental controls;Cueing hierarchy;SLP instruction and feedback;Compensatory techniques;Cognitive reorganization;Functional tasks;Compensatory strategies;Patient/family education;Internal/external aids;Multimodal communcation approach    Potential to Achieve Goals Good   with AAC   Potential Considerations Severity of impairments;Financial resources   has good family support, appears highly motivated   Consulted and Agree with Plan of Care Patient             Patient will benefit from skilled therapeutic intervention in order to improve the following deficits and impairments:   Aphasia  Verbal apraxia  Cognitive communication deficit    Problem List Patient Active Problem List   Diagnosis Date Noted   Status post  placement of implantable loop recorder 11/02/2020   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 02/06/2021, 7:03 PM  [place] [address] [place], [place], [address] Phone: [phone]   Fax:  [phone]   Name: Jonathan Newman MRN: [id number] Date of Birth: [birth year]

## 2021-02-06 NOTE — Therapy (Signed)
[place] MAIN [facility] SERVICES [address] [place], [place], [address] Phone: [phone]   Fax:  [phone]  Physical Therapy Treatment  Patient Details  Name: Jonathan Newman MRN: [id number] Date of Birth: [birth year] No data recorded  Encounter Date: 02/06/2021   PT End of Session - 02/06/21 1611     Visit Number 15    Number of Visits 25    Date for PT Re-Evaluation 01/17/21    Authorization Type Medicaid=Authorized combined PT/OT/SLP 27 visits    Authorization Time Period 9 authorized 10/31-12/31    Progress Note Due on Visit 20    PT Start Time 1517    PT Stop Time 1600    PT Time Calculation (min) 43 min    Equipment Utilized During Treatment Gait belt    Activity Tolerance Patient tolerated treatment well    Behavior During Therapy Impulsive;WFL for tasks assessed/performed             Past Medical History:  Diagnosis Date   Constipation    CVA (cerebral vascular accident) ([place])    Diarrhea    Dysarthria and anarthria    Hemiplegia and hemiparesis following cerebral infarction affecting right dominant side ([place])    Hyperlipidemia    Pain in right leg     No past surgical history on file.  There were no vitals filed for this visit.   Subjective Assessment - 02/06/21 1610     Subjective Pt reports doing well since last visit. No changes since last session.    Patient is accompained by: Family member   Sister Jonathan Newman   Pertinent History Patient is a 61 year old male with recent history of L MCA stroke in Feb 2021.Per Dr. 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 Had developed DVT afterwards. Patient is present with sister, Jonathan Newman today who reports he has been living with her since May 2022 and uses his electric w/c in the home an  has not walked since CVA- has manual w/c that he uses for community outings. She reports he requires assist with all ADLs and is impulsive but does require assist with transfers for safety- but states he does go to bathroom on his own at times.    Limitations Lifting;Standing;Walking;Writing;Reading;House hold activities    How long can you sit comfortably? no issues or limits    How long can you stand comfortably? < 47min    How long can you walk comfortably? Has not walked since CVA    Patient Stated Goals Caregiver goal- to be as independent and do as much as he can on his own.    Currently in Pain? No/denies    Pain Onset In the past 7 days            *10# AW donned for all standing and weight shifting activities.  Exercise/Activity Sets/Reps/Time/ Resistance Assistance Charge type Comments  Transfers  x2 Min a therex Performed throughout session. Cuing to attend to task/slow speed/LE positioning.  Nustep  LE only  3 min level 1, brief rest,  3 min level 1  minA for hip alignment  therEX  Break at 3 min, no UE asssit indicating LE muscle recruitment on the R LE  Min A from PT to maintain hip alignment.         Weight shifting  in standing with lateral rocks 2 x 5  U UE Neuro   The following activities were completed in parallel bars or at balance bar  -improved efficacy due to 10# AW           Exercise/Activity Sets/Reps/Time/ Resistance Assistance Charge type Comments  STS 3 x with various surfaces under L LE each set  4 sets x 3 reps each  L UE, CGA  Neuro   Trial 1 airex, trial 2 theraband pad, trial 3 3 degree 2 inch step, trial 4 6 degree 6 inch step (decline toward involved side to improve weight shifting to this side -in standing encouraged to shift weight to involved side, with increased fatigue pt began to put less weight through involved side.   Weight shifting in standing (A/P)  2 x 5  U UE and CGA  Neuro  The following activities were completed in parallel bars or at  balance bar  -cues for LE placement  -to improve weight acceptance to the R LE  Step taps (RLE)- no AW donned with this activity  1 x with 4 inch step  2x 5  with airex pad as step ea  U UE, CGA  Neuro -cues for hip flexion to complete activity as pt attempts to compensate with trunk flexion 4 inch step -Pt quick to fatigue with this activity  Most difficulty with removal of LE from step compared to step up portion                                                  Treatment Provided this session   Pt educated throughout session about proper posture and technique with exercises. Improved exercise technique, movement at target joints, use of target muscles after min to mod verbal, visual, tactile cues. Note: Portions of this document were prepared using Dragon voice recognition software and although reviewed may contain unintentional dictation errors in syntax, grammar, or spelling.   Cues for breathing throughout all of his exercises, pt tends to utilize valsalva maneuver, despite frequent cues pt always reverts to utilizing valsava maneuver                                PT Education - 02/06/21 1610     Education Details exercise form and technique    Person(s) Educated Patient    Methods Explanation    Comprehension Verbalized understanding              PT Short Term Goals - 01/30/21 1635       PT SHORT TERM GOAL #1   Title Pt will be independent with HEP in order to improve strength and balance in order to decrease fall risk and improve function at home and work.    Baseline 10/25/2020= Patient has no formal HEP in place12/8: Pt caregiver report sHEP has been going well.    Time 6    Period Weeks    Status Achieved    Target Date 12/06/20      PT SHORT TERM GOAL #2   Title Patient will perform all sit to stand transfers with CGA for improved functional mobility and decreased dependence on caregivers.    Baseline 10/25/2020=Patient requires  mod assist with all sit to stand transfers, 12/8: sister reports improved independence  with most transfers, some guarding still necessary for safety, deomnstrates ability to transfer to/from nuscetp and WC as well as to/from mat table.    Time 6    Period Weeks    Status Achieved    Target Date 12/06/20      PT SHORT TERM GOAL #3   Title Patient perform 5 time sit to stand in 50 seconds or less in order to indicate improved lower extremity strength and stability.    Baseline 01/30/21 Able to complete 2 sit to stands but only able to utilize left lower extremity has difficulty with balance upon standing.    Time 12    Period Weeks    Status New    Target Date 04/24/21               PT Long Term Goals - 01/30/21 1536       PT LONG TERM GOAL #1   Title Pt will improve FOTO to target score of 43  to display perceived improvements in ability to complete ADL's.    Baseline 10/25/2020= 29 12/8: 33.1 01/30/21: 36    Time 12    Period Weeks    Status Revised    Target Date 03/22/21      PT LONG TERM GOAL #2   Title Patient will perform stand pivot transfer with supervision from all surfaces without loss of balance or VC for safety.    Baseline 10/25/2020- Patient requires Mod assist for safe transfers with min ability to place any weight through right LE.01/30/21: able to perform without assistance but still requiries supervision as well as UE assistance on surfaces    Time 12    Period Weeks    Status On-going    Target Date 04/24/21      PT LONG TERM GOAL #3   Title Patient will perform static stand with least restrictive assistive device > 2 min with > 25% weight bearing through right LE without LOB.    Baseline 12/8: able to stand for approximately 30 sec with R LE on airex pad and Min WB on the R LE and ModA from 2x PT for LE placement and to assist with weight shifting, 01/31/20: stands for 1:25 with CGA from PT for balance and with min weight through R LE (foot is on floor)    Time  12    Period Weeks    Status On-going    Target Date 03/22/21      PT LONG TERM GOAL #4   Title Patient will perform 5 times sit to stand test and 40 seconds or less in order to indicate increased lower extremity power and ability to perform transfers.    Baseline 1/10/23There were to perform several repetitions of sit to stand and unable to bear weight through left lower extremity and also signs of unsteadiness when going to standing position.    Time 12    Period Weeks    Status New    Target Date 04/24/21                   Plan - 02/06/21 1611     Clinical Impression Statement Pt demonstrates excellent motivation for completion of PT session.  Completed stepping activities involving hip flexion and placement of the R LE on steps this session and pt able to complete for a few reps but fatigues very quickly with this activity.Pt reponded well to adding 10# AW to R LE during standign activities as he reports  he can feel his leg more and is better able to bear weight through it.  Pt will continue to benefit from skilled PT interventions in order to improve LE strength, function, transfer ability and overall function.    Personal Factors and Comorbidities Comorbidity 3+;Time since onset of injury/illness/exacerbation    Comorbidities CVA, Aphasia, Hyperlipidemia    Examination-Activity Limitations Bathing;Bed Mobility;Bend;Caring for Others;Carry;Lift;Reach Overhead;Squat;Stairs;Stand;Toileting;Transfers    Examination-Participation Restrictions Cleaning;Community Activity;Driving;Laundry;Medication Management;Meal Prep;Occupation;Personal Finances;Shop;Yard Work    Rehab Potential Fair    PT Frequency 1x / week    PT Duration 12 weeks    PT Treatment/Interventions ADLs/Self Care Home Management;Aquatic Therapy;Cryotherapy;Electrical Stimulation;Moist Heat;Ultrasound;DME Instruction;Gait training;Stair training;Functional mobility training;Therapeutic activities;Therapeutic  exercise;Balance training;Neuromuscular re-education;Patient/family education;Wheelchair mobility training;Manual techniques;Passive range of motion;Dry needling;Splinting;Orthotic Fit/Training;Taping    PT Next Visit Plan 10# ankle weight on R LE for increased proprioception. Attempt squats with this setup.    PT Home Exercise Plan no updates    Consulted and Agree with Plan of Care Patient;Family member/caregiver    Family Member Consulted Sister- Jonathan Newman             Patient will benefit from skilled therapeutic intervention in order to improve the following deficits and impairments:  Abnormal gait, Decreased activity tolerance, Decreased balance, Decreased cognition, Decreased coordination, Decreased endurance, Decreased knowledge of use of DME, Decreased mobility, Decreased range of motion, Decreased safety awareness, Decreased strength, Difficulty walking, Hypomobility, Impaired perceived functional ability, Impaired flexibility, Impaired sensation, Impaired UE functional use  Visit Diagnosis: Difficulty in walking, not elsewhere classified  Other abnormalities of gait and mobility  Unsteadiness on feet     Problem List Patient Active Problem List   Diagnosis Date Noted   Status post placement of implantable loop recorder 11/02/2020   Post-phlebitic syndrome 09/05/2020   Lower limb ulcer, calf, right, limited to breakdown of skin ([place]) 03/07/2020   Stroke ([place]) 03/07/2020   DVT (deep venous thrombosis) ([place]) 03/07/2020    Particia Lather, PT 02/06/2021, 4:20 PM  [place] MAIN [facility] SERVICES [address] [place], [place], [address] Phone: [phone]   Fax:  [phone]  Name: Jonathan Newman MRN: [id number] Date of Birth: 03-May-1960

## 2021-02-08 ENCOUNTER — Ambulatory Visit: Payer: Medicaid Other

## 2021-02-08 ENCOUNTER — Encounter: Payer: Medicaid Other | Admitting: Speech Pathology

## 2021-02-13 ENCOUNTER — Ambulatory Visit: Payer: Medicaid Other | Admitting: Physical Therapy

## 2021-02-13 ENCOUNTER — Other Ambulatory Visit: Payer: Self-pay

## 2021-02-13 DIAGNOSIS — R2681 Unsteadiness on feet: Secondary | ICD-10-CM

## 2021-02-13 DIAGNOSIS — R2689 Other abnormalities of gait and mobility: Secondary | ICD-10-CM

## 2021-02-13 DIAGNOSIS — R262 Difficulty in walking, not elsewhere classified: Secondary | ICD-10-CM

## 2021-02-13 DIAGNOSIS — R4701 Aphasia: Secondary | ICD-10-CM

## 2021-02-13 DIAGNOSIS — M6281 Muscle weakness (generalized): Secondary | ICD-10-CM | POA: Diagnosis not present

## 2021-02-13 NOTE — Therapy (Signed)
[place] MAIN [facility] SERVICES [address] [place], [place], [address] Phone: [phone]   Fax:  [phone]  Physical Therapy Treatment  Patient Details  Name: Jonathan Newman MRN: [id number] Date of Birth: 03-21-1960 No data recorded  Encounter Date: 02/13/2021   PT End of Session - 02/13/21 1525     Visit Number 16    Number of Visits 25    Date for PT Re-Evaluation 04/24/21    Authorization Type Medicaid=Authorized combined PT/OT/SLP 27 visits    Authorization Time Period 9 authorized 10/31-12/31    Progress Note Due on Visit 20    PT Start Time 1403    PT Stop Time 1433    PT Time Calculation (min) 30 min    Equipment Utilized During Treatment Gait belt    Activity Tolerance Patient tolerated treatment well    Behavior During Therapy Impulsive;WFL for tasks assessed/performed             Past Medical History:  Diagnosis Date   Constipation    CVA (cerebral vascular accident) ([place])    Diarrhea    Dysarthria and anarthria    Hemiplegia and hemiparesis following cerebral infarction affecting right dominant side ([place])    Hyperlipidemia    Pain in right leg     No past surgical history on file.  There were no vitals filed for this visit.   Subjective Assessment - 02/13/21 1423     Subjective Pt reports doing well since last visit. pt did have another seizure like episode on friday but resolved quickly. Pt reports MD is aware of seizure issue.    Patient is accompained by: Family member   Sister [name]   Pertinent History Patient is a 61 year old male with recent history of L MCA stroke in Feb 2021.Per Dr. 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 Had developed DVT afterwards. Patient is present with sister, [name] today who reports he  has been living with her since May 2022 and uses his electric w/c in the home an has not walked since CVA- has manual w/c that he uses for community outings. She reports he requires assist with all ADLs and is impulsive but does require assist with transfers for safety- but states he does go to bathroom on his own at times.    Limitations Lifting;Standing;Walking;Writing;Reading;House hold activities    How long can you sit comfortably? no issues or limits    How long can you stand comfortably? < 51min    How long can you walk comfortably? Has not walked since CVA    Patient Stated Goals Caregiver goal- to be as independent and do as much as he can on his own.    Pain Onset In the past 7 days                *10# AW donned for all standing and weight shifting activities.  Exercise/Activity Sets/Reps/Time/ Resistance Assistance Charge type Comments  Transfers  x2 Min a therex Performed throughout session. Cuing to attend to task/slow speed/LE positioning.  Nustep  LE only  3 min level 1, brief rest,  3 min level 1  minA for hip alignment  therEX  Break at 3 min, no UE asssit indicating LE muscle recruitment on the R LE  Min A from PT to maintain  hip alignment.         Weight shifting in standing with lateral rocks 2 x 5  U UE Neuro   The following activities were completed in parallel bars or at balance bar  -improved efficacy due to 10# AW           Exercise/Activity Sets/Reps/Time/ Resistance Assistance Charge type Comments        Weight shifting in standing (A/P)  2 x 5  U UE and CGA  Neuro  The following activities were completed in parallel bars or at balance bar  -cues for LE placement  -to improve weight acceptance to the R LE  Step taps (RLE)- no AW donned with this activity   1x 3  with airex pad as step  U UE, CGA  Neuro -- good hip flexion noted with concentric portion of activity  -Pt quick to fatigue with this activity  Most difficulty with removal of LE from step  compared to step up portion  Standing and weight shifting with game (no UE support)  3 x multiple minutes each  CGA, intermittent UE assist.  Neuro  Hesitancy to shift weight to left but with task of playing game anteriorly on white board pt shifted weight fairly. Had 10# AW on R LE to improve weightbearing through this side.                                             Treatment Provided this session   Pt educated throughout session about proper posture and technique with exercises. Improved exercise technique, movement at target joints, use of target muscles after min to mod verbal, visual, tactile cues.  Note: Portions of this document were prepared using Dragon voice recognition software and although reviewed may contain unintentional dictation errors in syntax, grammar, or spelling.   Cues for breathing throughout all of his exercises, pt tends to utilize valsalva maneuver, despite frequent cues pt always reverts to utilizing valsava maneuver                              PT Education - 02/13/21 1524     Education Details Exercsise technique to increase R LE utilization    Person(s) Educated Patient    Methods Explanation    Comprehension Verbalized understanding              PT Short Term Goals - 01/30/21 1635       PT SHORT TERM GOAL #1   Title Pt will be independent with HEP in order to improve strength and balance in order to decrease fall risk and improve function at home and work.    Baseline 10/25/2020= Patient has no formal HEP in place12/8: Pt caregiver report sHEP has been going well.    Time 6    Period Weeks    Status Achieved    Target Date 12/06/20      PT SHORT TERM GOAL #2   Title Patient will perform all sit to stand transfers with CGA for improved functional mobility and decreased dependence on caregivers.    Baseline 10/25/2020=Patient requires mod assist with all sit to stand transfers, 12/8: sister reports improved  independence with most transfers, some guarding still necessary for safety, deomnstrates ability to transfer to/from nuscetp and WC as well as to/from mat table.  Time 6    Period Weeks    Status Achieved    Target Date 12/06/20      PT SHORT TERM GOAL #3   Title Patient perform 5 time sit to stand in 50 seconds or less in order to indicate improved lower extremity strength and stability.    Baseline 01/30/21 Able to complete 2 sit to stands but only able to utilize left lower extremity has difficulty with balance upon standing.    Time 12    Period Weeks    Status New    Target Date 04/24/21               PT Long Term Goals - 01/30/21 1536       PT LONG TERM GOAL #1   Title Pt will improve FOTO to target score of 43  to display perceived improvements in ability to complete ADL's.    Baseline 10/25/2020= 29 12/8: 33.1 01/30/21: 36    Time 12    Period Weeks    Status Revised    Target Date 03/22/21      PT LONG TERM GOAL #2   Title Patient will perform stand pivot transfer with supervision from all surfaces without loss of balance or VC for safety.    Baseline 10/25/2020- Patient requires Mod assist for safe transfers with min ability to place any weight through right LE.01/30/21: able to perform without assistance but still requiries supervision as well as UE assistance on surfaces    Time 12    Period Weeks    Status On-going    Target Date 04/24/21      PT LONG TERM GOAL #3   Title Patient will perform static stand with least restrictive assistive device > 2 min with > 25% weight bearing through right LE without LOB.    Baseline 12/8: able to stand for approximately 30 sec with R LE on airex pad and Min WB on the R LE and ModA from 2x PT for LE placement and to assist with weight shifting, 01/31/20: stands for 1:25 with CGA from PT for balance and with min weight through R LE (foot is on floor)    Time 12    Period Weeks    Status On-going    Target Date 03/22/21       PT LONG TERM GOAL #4   Title Patient will perform 5 times sit to stand test and 40 seconds or less in order to indicate increased lower extremity power and ability to perform transfers.    Baseline 1/10/23There were to perform several repetitions of sit to stand and unable to bear weight through left lower extremity and also signs of unsteadiness when going to standing position.    Time 12    Period Weeks    Status New    Target Date 04/24/21                   Plan - 02/13/21 1529     Clinical Impression Statement Pt demonstrates excellent motivation for completion of PT session. Completed stepping activities involving hip flexion and placement of the R LE on steps this session and pt able to complete for a few reps but fatigues very quickly with this activity, and has increased dificulty with removal of LE from elevated surface. Pt reponded well to adding 10# AW to R LE during standign activities as he reports he can feel his leg more and is better able to bear weight through it.  Pt responded very well to task of playing games on white board for promoting standing and weight shifting without UE support.  Pt will continue to benefit from skilled PT interventions in order to improve LE strength, function, transfer ability and overall function.    Personal Factors and Comorbidities Comorbidity 3+;Time since onset of injury/illness/exacerbation    Comorbidities CVA, Aphasia, Hyperlipidemia    Examination-Activity Limitations Bathing;Bed Mobility;Bend;Caring for Others;Carry;Lift;Reach Overhead;Squat;Stairs;Stand;Toileting;Transfers    Examination-Participation Restrictions Cleaning;Community Activity;Driving;Laundry;Medication Management;Meal Prep;Occupation;Personal Finances;Shop;Yard Work    Rehab Potential Fair    PT Frequency 1x / week    PT Duration 12 weeks    PT Treatment/Interventions ADLs/Self Care Home Management;Aquatic Therapy;Cryotherapy;Electrical Stimulation;Moist  Heat;Ultrasound;DME Instruction;Gait training;Stair training;Functional mobility training;Therapeutic activities;Therapeutic exercise;Balance training;Neuromuscular re-education;Patient/family education;Wheelchair mobility training;Manual techniques;Passive range of motion;Dry needling;Splinting;Orthotic Fit/Training;Taping    PT Next Visit Plan 10# ankle weight on R LE for increased proprioception. Attempt squats with this setup.    PT Home Exercise Plan no updates    Consulted and Agree with Plan of Care Patient;Family member/caregiver    Family Member Consulted Sister- [name]             Patient will benefit from skilled therapeutic intervention in order to improve the following deficits and impairments:  Abnormal gait, Decreased activity tolerance, Decreased balance, Decreased cognition, Decreased coordination, Decreased endurance, Decreased knowledge of use of DME, Decreased mobility, Decreased range of motion, Decreased safety awareness, Decreased strength, Difficulty walking, Hypomobility, Impaired perceived functional ability, Impaired flexibility, Impaired sensation, Impaired UE functional use  Visit Diagnosis: Aphasia  Difficulty in walking, not elsewhere classified  Other abnormalities of gait and mobility  Unsteadiness on feet     Problem List Patient Active Problem List   Diagnosis Date Noted   Status post placement of implantable loop recorder 11/02/2020   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 [place], [place], [address] Phone: [phone]   Fax:  [phone]  Name: Jonathan Newman MRN: [id number] Date of Birth: May 07, 1960

## 2021-02-16 DIAGNOSIS — Z23 Encounter for immunization: Secondary | ICD-10-CM | POA: Diagnosis not present

## 2021-02-20 ENCOUNTER — Ambulatory Visit: Payer: Medicaid Other | Admitting: Physical Therapy

## 2021-02-20 ENCOUNTER — Ambulatory Visit: Payer: Medicaid Other | Admitting: Speech Pathology

## 2021-02-20 ENCOUNTER — Ambulatory Visit: Payer: Medicaid Other

## 2021-02-22 ENCOUNTER — Ambulatory Visit: Payer: Medicaid Other | Admitting: Speech Pathology

## 2021-02-27 ENCOUNTER — Ambulatory Visit: Payer: Medicaid Other

## 2021-03-01 ENCOUNTER — Encounter: Payer: Medicaid Other | Admitting: Speech Pathology

## 2021-03-06 ENCOUNTER — Ambulatory Visit (INDEPENDENT_AMBULATORY_CARE_PROVIDER_SITE_OTHER): Payer: Medicaid Other | Admitting: Vascular Surgery

## 2021-03-08 ENCOUNTER — Ambulatory Visit: Payer: Medicaid Other | Admitting: Physical Therapy

## 2021-03-13 ENCOUNTER — Ambulatory Visit: Payer: Medicaid Other

## 2021-03-22 ENCOUNTER — Encounter: Payer: Medicaid Other | Admitting: Speech Pathology

## 2021-03-22 ENCOUNTER — Ambulatory Visit: Payer: Medicaid Other

## 2021-03-29 ENCOUNTER — Ambulatory Visit: Payer: Medicaid Other

## 2021-04-03 ENCOUNTER — Ambulatory Visit: Payer: Medicaid Other

## 2021-04-05 ENCOUNTER — Encounter: Payer: Medicaid Other | Admitting: Speech Pathology

## 2021-04-10 ENCOUNTER — Ambulatory Visit: Payer: Medicaid Other | Admitting: Physical Therapy

## 2021-04-10 ENCOUNTER — Ambulatory Visit: Payer: Medicaid Other | Attending: Family Medicine | Admitting: Speech Pathology

## 2021-04-10 ENCOUNTER — Other Ambulatory Visit: Payer: Self-pay

## 2021-04-10 DIAGNOSIS — R4701 Aphasia: Secondary | ICD-10-CM | POA: Diagnosis not present

## 2021-04-10 DIAGNOSIS — R262 Difficulty in walking, not elsewhere classified: Secondary | ICD-10-CM | POA: Insufficient documentation

## 2021-04-10 DIAGNOSIS — R2681 Unsteadiness on feet: Secondary | ICD-10-CM | POA: Diagnosis present

## 2021-04-10 DIAGNOSIS — R41841 Cognitive communication deficit: Secondary | ICD-10-CM | POA: Diagnosis present

## 2021-04-10 DIAGNOSIS — R482 Apraxia: Secondary | ICD-10-CM | POA: Diagnosis present

## 2021-04-10 DIAGNOSIS — R2689 Other abnormalities of gait and mobility: Secondary | ICD-10-CM

## 2021-04-10 NOTE — Therapy (Signed)
[place] ?[facility] RE[facility] SERVICES ?[address] ?[place], [place], [address] ?Phone: [phone]   Fax:  [phone] ? ?Physical Therapy Treatment ? ?Patient Details  ?Name: Jonathan Newman ?MRN: [id number] ?Date of Birth: May 02, 1960 ?No data recorded ? ?Encounter Date: 04/10/2021 ? ? PT End of Session - 04/10/21 1534   ? ? Visit Number 17   ? Number of Visits 25   ? Date for PT Re-Evaluation 04/24/21   ? Authorization Type Medicaid=Authorized combined PT/OT/SLP 27 visits   ? Authorization Time Period 9 authorized 10/31-12/31   ? Progress Note Due on Visit 20   ? PT Start Time 1600   ? PT Stop Time 1645   ? PT Time Calculation (min) 45 min   ? Equipment Utilized During Treatment Gait belt   ? Activity Tolerance Patient tolerated treatment well   ? Behavior During Therapy Impulsive;WFL for tasks assessed/performed   ? ?  ?  ? ?  ? ? ?Past Medical History:  ?Diagnosis Date  ? Constipation   ? CVA (cerebral vascular accident) [facility])   ? Diarrhea   ? Dysarthria and anarthria   ? Hemiplegia and hemiparesis following cerebral infarction affecting right dominant side ([facility])   ? Hyperlipidemia   ? Pain in right leg   ? ? ?No past surgical history on file. ? ?There were no vitals filed for this visit. ? ? Subjective Assessment - 04/10/21 1533   ? ? Subjective Pt reports doing well since last visit. Report he has been sleeping more in the day and waking up and watching TV more during the night per caregiver/sister. .   ? Patient is accompained by: Family member   Sister [name]  ? Pertinent History Patient is a 61 year old male with recent history of L MCA stroke in Feb 2021.Per Dr. 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 Had developed DVT afterwards. Patient is present with sister, [name] today who  reports he has been living with her since May 2022 and uses his electric w/c in the home an has not walked since CVA- has manual w/c that he uses for community outings. She reports he requires assist with all ADLs and is impulsive but does require assist with transfers for safety- but states he does go to bathroom on his own at times.   ? Limitations Lifting;Standing;Walking;Writing;Reading;House hold activities   ? How long can you sit comfortably? no issues or limits   ? How long can you stand comfortably? <   ? How long can you walk comfortably? Has not walked since CVA   ? Patient Stated Goals Caregiver goal- to be as independent and do as much as he can on his own.   ? Pain Onset In the past 7 days   ? ?  ?  ? ?  ? ? ? ?10# AW donned for all standing and weight shifting activities.  ?Exercise/Activity Sets/Reps/Time/ Resistance Assistance Charge type Comments  ?Transfers  x2 Min a therex Performed throughout session. Cuing to attend to task/slow speed/LE positioning.  ?Nustep  ?LE only with R LE in hip adduction stabilizer  (nustep attachment) 6 min level 1 LE only  minA for hip alignment  therEX  No need for rest break this session.   ?      ?Weight shifting in  standing with lateral rocks and anterior rocks  2 x 5  U UE Neuro   The following activities were completed in parallel bars or at balance bar  ?-improved efficacy due to 10# AW  ?  ?      ? ?Exercise/Activity Sets/Reps/Time/ Resistance Assistance Charge type Comments  ?      ?Weight shifting in standing (A/P)  2 x 5  U UE and CGA  Neuro  The following activities were completed in parallel bars or at balance bar  ?-cues for LE placement  ?-to improve weight acceptance to the R LE  ?      ?Standing and weight shifting with game (no UE support)  3 x multiple minutes each  CGA, intermittent UE assist.  Neuro  Hesitancy to shift weight to left but with task of playing game anteriorly on white board pt shifted weight fairly. Had 10# AW on R LE to  improve weightbearing through this side.   ?Forward stepping to non stick padding, progressing to hedgehog  1*5 with non stick padding ?2 x 5 with padding plus hedgehog  UE A on the left, CGA  Neuro Used momentum with hips to accomplish task,  ?Increased difficulty with adding hedgehog for higher target increasing hip flexion difficulty   ?Standing ball kicks with R LE  X 4 sets as long as pt could tolerate ( approximately 30-40 sec) UE, CGA  NMR  PT had to hold ball in correct placement, difficulty with this task generally, pt used communication board ot say he " liked" activity.   ?      ?      ?      ?      ?      ?Treatment Provided this session  ? ?Pt educated throughout session about proper posture and technique with exercises. Improved exercise technique, movement at target joints, use of target muscles after min to mod verbal, visual, tactile cues. ? ?Note: Portions of this document were prepared using Dragon voice recognition software and although reviewed may contain unintentional dictation errors in syntax, grammar, or spelling. ? ? Cues for breathing throughout all of his exercises, pt tends to utilize valsalva maneuver, despite frequent cues pt always reverts to utilizing valsava maneuver  ? ?Pt required occasional rest breaks due fatigue, Pt will intermittently stop exercise ? ? ? ? ? ? ? ? ? ? ? ? ? ? ? ? ? ? ? ? ? ? ? ? ? ? ? ? ? ? PT Education - 04/10/21 1534   ? ? Education Details exercise form and technique   ? Person(s) Educated Patient   ? Methods Explanation   ? Comprehension Verbalized understanding   ? ?  ?  ? ?  ? ? ? PT Short Term Goals - 01/30/21 1635   ? ?  ? PT SHORT TERM GOAL #1  ? Title Pt will be independent with HEP in order to improve strength and balance in order to decrease fall risk and improve function at home and work.   ? Baseline 10/25/2020= Patient has no formal HEP in place12/8: Pt caregiver report sHEP has been going well.   ? Time 6   ? Period Weeks   ? Status Achieved    ? Target Date 12/06/20   ?  ? PT SHORT TERM GOAL #2  ? Title Patient will perform all sit to stand transfers with CGA for improved functional mobility and decreased dependence on caregivers.   ?  Baseline 10/25/2020=Patient requires mod assist with all sit to stand transfers, 12/8: sister reports improved independence with most transfers, some guarding still necessary for safety, deomnstrates ability to transfer to/from nuscetp and WC as well as to/from mat table.   ? Time 6   ? Period Weeks   ? Status Achieved   ? Target Date 12/06/20   ?  ? PT SHORT TERM GOAL #3  ? Title Patient perform 5 time sit to stand in 50 seconds or less in order to indicate improved lower extremity strength and stability.   ? Baseline 01/30/21 Able to complete 2 sit to stands but only able to utilize left lower extremity has difficulty with balance upon standing.   ? Time 12   ? Period Weeks   ? Status New   ? Target Date 04/24/21   ? ?  ?  ? ?  ? ? ? ? PT Long Term Goals - 01/30/21 1536   ? ?  ? PT LONG TERM GOAL #1  ? Title Pt will improve FOTO to target score of 43  to display perceived improvements in ability to complete ADL's.   ? Baseline 10/25/2020= 29 12/8: 33.1 01/30/21: 36   ? Time 12   ? Period Weeks   ? Status Revised   ? Target Date 03/22/21   ?  ? PT LONG TERM GOAL #2  ? Title Patient will perform stand pivot transfer with supervision from all surfaces without loss of balance or VC for safety.   ? Baseline 10/25/2020- Patient requires Mod assist for safe transfers with min ability to place any weight through right LE.01/30/21: able to perform without assistance but still requiries supervision as well as UE assistance on surfaces   ? Time 12   ? Period Weeks   ? Status On-going   ? Target Date 04/24/21   ?  ? PT LONG TERM GOAL #3  ? Title Patient will perform static stand with least restrictive assistive device > 2 min with > 25% weight bearing through right LE without LOB.   ? Baseline 12/8: able to stand for approximately 30 sec  with R LE on airex pad and Min WB on the R LE and ModA from 2x PT for LE placement and to assist with weight shifting, 01/31/20: stands for 1:25 with CGA from PT for balance and with min weight through R LE (foot i

## 2021-04-11 NOTE — Therapy (Signed)
[facility] ?[place], [place], [address] ?Phone: [phone]   Fax:  [phone] ? ?Speech Language Pathology Treatment ? ?Patient Details  ?Name: Jonathan Newman ?MRN: [id number] ?Date of Birth: 07/11/1960 ?Referring Provider (SLP): Dr. Jennings Books ? ? ?Encounter Date: 04/10/2021 ? ? End of Session - 04/11/21 0943   ? ? Visit Number 16   ? Number of Visits 27   ? Date for SLP Re-Evaluation 07/09/21   ? Authorization Type Medicaid - 27 visits combined for SLP/PT; 12 ST visits auth 02/07/21-05/01/21   ? Authorization Time Period Visits used in 2023 (PT/ST comb: 10 on 04/10/21)   ? Authorization - Visit Number 1   ? Authorization - Number of Visits 12   ? Progress Note Due on Visit 10   ? SLP Start Time 1500   ? SLP Stop Time  1600   ? SLP Time Calculation (min) 60 min   ? Activity Tolerance Patient tolerated treatment well   ? ?  ?  ? ?  ? ? ?Past Medical History:  ?Diagnosis Date  ? Constipation   ? CVA (cerebral vascular accident) [facility])   ? Diarrhea   ? Dysarthria and anarthria   ? Hemiplegia and hemiparesis following cerebral infarction affecting right dominant side ([facility])   ? Hyperlipidemia   ? Pain in right leg   ? ? ?No past surgical history on file. ? ?There were no vitals filed for this visit. ? ? Subjective Assessment - 04/11/21 0935   ? ? Subjective Pt gets out his Lingraphica and opens "phone calls" section   ? Patient is accompained by: Family member   sister [name]  ? Currently in Pain? No/denies   ? ?  ?  ? ?  ? ? ? ? ? ? ? ? ADULT SLP TREATMENT - 04/11/21 0940   ? ?  ? General Information  ? Behavior/Cognition Alert;Cooperative;Decreased sustained attention   ? HPI Jonathan Newman is a 61 y.o. male referred by Dr. Manuella Ghazi for SLP evaluation. Hx noted for DVT, HTN Pt with reported CVA in February 2021 (records from OSH are unavailable), with subsequent rehabilitation in [place]. Pt now living with his sister in [place]. Repeat MRI brain on  09/29/20 showed "Large chronic left MCA territory and left MCA/PCA watershed territory cortical/subcortical infarct, as described. Superimposed chronic perforator infarct within the left centrum semiovale/corona radiata and left basal ganglia. Associated wallerian degeneration on the left." Chronic infarcts also within left thalamus and bilateral pons, and mild generalized cerebral and cerebellar atrophy.   ?  ? Cognitive-Linquistic Treatment  ? Treatment focused on Aphasia   ? Skilled Treatment Pt's sister has been assisting pt with adding new icons to his communication device. She reports when pt tries to communicate something and she "figures it out," she adds it to his device. Wheelchair mount arrived for pt's motorized chair, however he is unable to move through doorways with this, and so has not been using it. Contacted Lingraphica to request additional options to make pt's device more accessible. Pt used his Lingraphica to raise topics (his friend Nicki Reaper, to talk about his dog, to comment on preferred foods). Pt accessed internet on his device and questioned (non-speech vocalization) SLP about [employer]. Pt expressed interest in joining aphasia conversation group [employer]; assisted pt with setup and enrollment in virtual session for this coming Friday. Encouraged sister to use another device for Zoom so that pt can use his Lingraphica to  interact. Role played pt making introduction to a group; pt required min verbal, gestural cues to access icons on his device for introducing himself and sharing personal details. Pt accessed keyboard often and used this to write his name; appeared to be scanning letters and using predictive text to attempt words; unclear what pt's message intent was with typed single words. Demonstrated use of whiteboard; pt wrote his age in numbers and dog's name with mod cues. Demonstrated use of "fast talk" section for pt to make quick responses (Y/N, etc). Discussed with  sister strategies/how to prompt pt in a communication breakdown, as well as potential for using a "when" section to add details/context about past/present/future.   ?  ? Assessment / Recommendations / Plan  ? Plan Continue with current plan of care   ?  ? Progression Toward Goals  ? Progression toward goals Progressing toward goals   ? ?  ?  ? ?  ? ? ? SLP Education - 04/11/21 0943   ? ? Education Details Use device to communicate with PT   ? Person(s) Educated Patient   ? Methods Explanation   ? Comprehension Verbalized understanding   ? ?  ?  ? ?  ? ? ? SLP Short Term Goals - 04/11/21 0950   ? ?  ? SLP SHORT TERM GOAL #1  ? Title Patient will comprehend simple Y/N questions >80% accuracy with written cues/ AAC support.   ? Baseline 60%   ? Time 5   ? Period Weeks   ? Status Achieved   ?  ? SLP SHORT TERM GOAL #2  ? Title Patient will indicate when he does not comprehend in >80% of trials (word to sentence level), using facial expression, gesture, or AAC.   ? Baseline 0%   ? Time 5   ? Period Weeks   ? Status Partially Met   ?  ? SLP SHORT TERM GOAL #3  ? Title Patient will answer simple biographical questions 80% accuracy (Y/N questions or AAC).   ? Baseline 80% with extended time, repetition, supportive conversation strategies   ? Time 5   ? Period Weeks   ? Status Partially Met   ?  ? SLP SHORT TERM GOAL #4  ? Title Per pt/caregiver report, patient will use AAC to make request related to physical needs or outings at least 5/7 days with min cues.   ? Baseline Needs max cues outside of therapy   ? Time 6   ? Period --   sessions  ? Status Revised   will renew; goal not met as pt was unable to attend therapy since 1/17 due to scheduling  ? Target Date --   by visit 68  ?  ? SLP SHORT TERM GOAL #5  ? Title Pt will use AAC to reduce frustration by relaying feelings or needs during a communication breakdown with min cues, per pt/family report.   ? Baseline Patient has emotional outbursts   ? Time 6   ? Period --    sessions  ? Status Revised   will renew; goal not met as pt was unable to attend therapy since 1/17 due to scheduling  ? Target Date --   by visit 20  ? ?  ?  ? ?  ? ? ? SLP Long Term Goals - 04/11/21 0953   ? ?  ? SLP LONG TERM GOAL #1  ? Title Pt will communicate emergency information 100% accuracy, using AAC if necessary.   ?  Baseline with moderate cues; progressing, will continue   ? Time 12   ? Period Weeks   ? Status Revised   will renew all LTGs; goals not met as pt was unable to attend therapy since 1/17 due to scheduling  ?  ? SLP LONG TERM GOAL #2  ? Title Patient will reduce frustration by communicating thoughts/feelings with AAC in 80% of opportunities.   ? Baseline beginning to combine facial expressions with use of AAC icons; progressing, will continue   ? Time 12   ? Period Weeks   ? Status Revised   ?  ? SLP LONG TERM GOAL #3  ? Title Pt will successfully convey requests for outings/ personal needs outside of ST >75% of the time, using AAC if necessary.   ? Baseline able to do so in therapy sessions; ongoing training necessary for carryover at home   ? Time 12   ? Period Weeks   ? Status Revised   ?  ? SLP LONG TERM GOAL #4  ? Title Pt will reduce social isolation risk by participating in simple conversational exchange (3-5 minutes), using AAC.   ? Baseline unable to participate meaningfully   ? Time 12   ? Period Weeks   ? Status Achieved   ? ?  ?  ? ?  ? ? ? Plan - 04/11/21 0947   ? ? 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 Visual cues (AAC icons) appear to increase pt's comprehension; pt demonstrating emerging ability to navigate communication device to communicate basic wants/needs, however requires redirection at times due to distractiblity. Pt initiating communication using his device to raise topics, however he requires cues to use his device to respond in role played  scenarios and in communication exchanges outside of ST room (with PT) I recommend skilled ST with focus on improving functional communication, customizing AAC device to support pt in communicating/comprehending.

## 2021-04-18 ENCOUNTER — Ambulatory Visit: Payer: Medicaid Other

## 2021-04-18 ENCOUNTER — Ambulatory Visit: Payer: Medicaid Other | Admitting: Speech Pathology

## 2021-04-18 DIAGNOSIS — R2689 Other abnormalities of gait and mobility: Secondary | ICD-10-CM

## 2021-04-18 DIAGNOSIS — R41841 Cognitive communication deficit: Secondary | ICD-10-CM

## 2021-04-18 DIAGNOSIS — R262 Difficulty in walking, not elsewhere classified: Secondary | ICD-10-CM

## 2021-04-18 DIAGNOSIS — R4701 Aphasia: Secondary | ICD-10-CM

## 2021-04-18 DIAGNOSIS — R2681 Unsteadiness on feet: Secondary | ICD-10-CM

## 2021-04-18 DIAGNOSIS — R482 Apraxia: Secondary | ICD-10-CM

## 2021-04-18 NOTE — Therapy (Signed)
[facility] ?[address] ?[place], [place], [address] ?Phone: [phone]   Fax:  [phone] ? ?Physical Therapy Treatment ? ?Patient Details  ?Name: Jonathan Newman ?MRN: [id number] ?Date of Birth: [birth year] ?No data recorded ? ?Encounter Date: 04/18/2021 ? ? PT End of Session - 04/18/21 1514   ? ? Visit Number 18   ? Number of Visits 25   ? Date for PT Re-Evaluation 04/24/21   ? Authorization Type Medicaid=Authorized combined PT/OT/SLP 27 visits   ? Authorization Time Period 9 authorized 10/31-12/31   ? Progress Note Due on Visit 20   ? PT Start Time 1515   ? PT Stop Time 1600   ? PT Time Calculation (min) 45 min   ? Equipment Utilized During Treatment Gait belt   ? Activity Tolerance Patient tolerated treatment well   ? Behavior During Therapy Impulsive;WFL for tasks assessed/performed   ? ?  ?  ? ?  ? ? ?Past Medical History:  ?Diagnosis Date  ? Constipation   ? CVA (cerebral vascular accident) [facility])   ? Diarrhea   ? Dysarthria and anarthria   ? Hemiplegia and hemiparesis following cerebral infarction affecting right dominant side ([facility])   ? Hyperlipidemia   ? Pain in right leg   ? ? ?History reviewed. No pertinent surgical history. ? ?There were no vitals filed for this visit. ? ? Subjective Assessment - 04/18/21 1520   ? ? Subjective Patient reports compliance with HEP. No falls or LOB since last session.   ? Patient is accompained by: Family member   Sister [name]  ? Pertinent History Patient is a 61 year old male with recent history of L MCA stroke in Feb 2021.Per Dr. 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 Had developed DVT afterwards. Patient is present with sister, [name] today who reports he has been living with her since May 2022 and uses his electric w/c  in the home an has not walked since CVA- has manual w/c that he uses for community outings. She reports he requires assist with all ADLs and is impulsive but does require assist with transfers for safety- but states he does go to bathroom on his own at times.   ? Limitations Lifting;Standing;Walking;Writing;Reading;House hold activities   ? How long can you sit comfortably? no issues or limits   ? How long can you stand comfortably? <   ? How long can you walk comfortably? Has not walked since CVA   ? Patient Stated Goals Caregiver goal- to be as independent and do as much as he can on his own.   ? Currently in Pain? No/denies   ? ?  ?  ? ?  ? ? ? ? ? ? ? ? ? ?Treatment: ? ?Therex ?Nustep LE only with RLE in hip adduction stabilizer 4 minutes level 1; cues for keeping RPM> 50 for cardiovascular challenge  ?  ?Stabilization to knee to eliminate hip flexion; knee extension AAROM 15x RLE ?Hip flexion RLE 15x ?Hamstring isometric pressing into PT foot under RLE 10x 3 second holds ? ?Neuro Re-ed:  ? ?In // bars: ?Walk two lengths of // bars with seated rest break; SUE support with stabilization and half lunge behind patient for patient to weight shift ~40% of his weight while standing on his RLE  to progress LLE. Stand and turn with min A to sit in chair ? ?Airex pad under LLE: stand and weight shift onto RLE ; 5lb ankle weight on RLE to maintain on ground due to it "floating". 4x 30 seconds with min A for weight shift of hips.  ? ? ? ? ? ?Pt educated throughout session about proper posture and technique with exercises. Improved exercise technique, movement at target joints, use of target muscles after min to mod verbal, visual, tactile cues. ? ?Patient is able to ambulate for first time with 40% assistance of weight shift/acceptance onto RLE. Patient is very excited about achievement and was able to perform for a second trial. Will continue to progress ambulation and weight acceptance. Patient's sister is present  and highly motivating for patient. Pt will continue to benefit from skilled PT interventions in order to improve LE strength, function, transfer ability and overall function ? ? ? ? ? ? ? ? ? ? ? ? ? ? PT Education - 04/18/21 1513   ? ? Education Details exercise technique, body mechanics   ? Person(s) Educated Patient   ? Methods Explanation;Demonstration;Tactile cues;Verbal cues   ? Comprehension Verbalized understanding;Returned demonstration;Verbal cues required;Tactile cues required   ? ?  ?  ? ?  ? ? ? PT Short Term Goals - 01/30/21 1635   ? ?  ? PT SHORT TERM GOAL #1  ? Title Pt will be independent with HEP in order to improve strength and balance in order to decrease fall risk and improve function at home and work.   ? Baseline 10/25/2020= Patient has no formal HEP in place12/8: Pt caregiver report sHEP has been going well.   ? Time 6   ? Period Weeks   ? Status Achieved   ? Target Date 12/06/20   ?  ? PT SHORT TERM GOAL #2  ? Title Patient will perform all sit to stand transfers with CGA for improved functional mobility and decreased dependence on caregivers.   ? Baseline 10/25/2020=Patient requires mod assist with all sit to stand transfers, 12/8: sister reports improved independence with most transfers, some guarding still necessary for safety, deomnstrates ability to transfer to/from nuscetp and WC as well as to/from mat table.   ? Time 6   ? Period Weeks   ? Status Achieved   ? Target Date 12/06/20   ?  ? PT SHORT TERM GOAL #3  ? Title Patient perform 5 time sit to stand in 50 seconds or less in order to indicate improved lower extremity strength and stability.   ? Baseline 01/30/21 Able to complete 2 sit to stands but only able to utilize left lower extremity has difficulty with balance upon standing.   ? Time 12   ? Period Weeks   ? Status New   ? Target Date 04/24/21   ? ?  ?  ? ?  ? ? ? ? PT Long Term Goals - 01/30/21 1536   ? ?  ? PT LONG TERM GOAL #1  ? Title Pt will improve FOTO to target score of  43  to display perceived improvements in ability to complete ADL's.   ? Baseline 10/25/2020= 29 12/8: 33.1 01/30/21: 36   ? Time 12   ? Period Weeks   ? Status Revised   ? Target Date 03/22/21   ?  ? PT LONG TERM GOAL #2  ? Title Patient will perform stand pivot transfer with supervision from all surfaces without loss of  balance or VC for safety.   ? Baseline 10/25/2020- Patient requires Mod assist for safe transfers with min ability to place any weight through right LE.01/30/21: able to perform without assistance but still requiries supervision as well as UE assistance on surfaces   ? Time 12   ? Period Weeks   ? Status On-going   ? Target Date 04/24/21   ?  ? PT LONG TERM GOAL #3  ? Title Patient will perform static stand with least restrictive assistive device > 2 min with > 25% weight bearing through right LE without LOB.   ? Baseline 12/8: able to stand for approximately 30 sec with R LE on airex pad and Min WB on the R LE and ModA from 2x PT for LE placement and to assist with weight shifting, 01/31/20: stands for 1:25 with CGA from PT for balance and with min weight through R LE (foot is on floor)   ? Time 12   ? Period Weeks   ? Status On-going   ? Target Date 03/22/21   ?  ? PT LONG TERM GOAL #4  ? Title Patient will perform 5 times sit to stand test and 40 seconds or less in order to indicate increased lower extremity power and ability to perform transfers.   ? Baseline 1/10/23There were to perform several repetitions of sit to stand and unable to bear weight through left lower extremity and also signs of unsteadiness when going to standing position.   ? Time 12   ? Period Weeks   ? Status New   ? Target Date 04/24/21   ? ?  ?  ? ?  ? ? ? ? ? ? ? ? Plan - 04/18/21 1613   ? ? Clinical Impression Statement Patient is able to ambulate for first time with 40% assistance of weight shift/acceptance onto RLE. Patient is very excited about achievement and was able to perform for a second trial. Will continue to  progress ambulation and weight acceptance. Patient's sister is present and highly motivating for patient. Pt will continue to benefit from skilled PT interventions in order to improve LE strength, function, tra

## 2021-04-19 NOTE — Therapy (Signed)
[facility] ?[place], [place], [address] ?Phone: [phone]   Fax:  [phone] ? ?Speech Language Pathology Treatment ? ?Patient Details  ?Name: Jonathan Newman ?MRN: [id number] ?Date of Birth: 16-Sep-1960 ?Referring Provider (SLP): Dr. Jennings Books ? ? ?Encounter Date: 04/18/2021 ? ? End of Session - 04/19/21 0810   ? ? Visit Number 17   ? Number of Visits 27   ? Date for SLP Re-Evaluation 07/09/21   ? Authorization Type Medicaid - 27 visits combined for SLP/PT; 12 ST visits auth 02/07/21-05/01/21   ? Authorization Time Period Visits used in 2023 (PT/ST comb: 10 on 04/10/21)   ? Authorization - Visit Number 1   ? Authorization - Number of Visits 12   ? Progress Note Due on Visit 10   ? SLP Start Time 1600   ? SLP Stop Time  1700   ? SLP Time Calculation (min) 60 min   ? Activity Tolerance Patient tolerated treatment well   ? ?  ?  ? ?  ? ? ?Past Medical History:  ?Diagnosis Date  ? Constipation   ? CVA (cerebral vascular accident) [facility])   ? Diarrhea   ? Dysarthria and anarthria   ? Hemiplegia and hemiparesis following cerebral infarction affecting right dominant side ([place])   ? Hyperlipidemia   ? Pain in right leg   ? ? ?No past surgical history on file. ? ?There were no vitals filed for this visit. ? ? Subjective Assessment - 04/19/21 0809   ? ? Subjective Pt excited to show SLP a video of him walking with PT   ? Patient is accompained by: Family member   sister [facility]  ? Currently in Pain? No/denies   ? ?  ?  ? ?  ? ? ? ? ? ? ? ? ADULT SLP TREATMENT - 04/19/21 0811   ? ?  ? General Information  ? Behavior/Cognition Alert;Cooperative;Decreased sustained attention   ? HPI Jonathan Newman is a 61 y.o. male referred by Dr. Manuella Ghazi for SLP evaluation. Hx noted for DVT, HTN Pt with reported CVA in February 2021 (records from OSH are unavailable), with subsequent rehabilitation in [place]. Pt now living with his sister in [place]. Repeat MRI brain on 09/29/20  showed "Large chronic left MCA territory and left MCA/PCA watershed territory cortical/subcortical infarct, as described. Superimposed chronic perforator infarct within the left centrum semiovale/corona radiata and left basal ganglia. Associated wallerian degeneration on the left." Chronic infarcts also within left thalamus and bilateral pons, and mild generalized cerebral and cerebellar atrophy.   ?  ? Cognitive-Linquistic Treatment  ? Treatment focused on Aphasia   ? Skilled Treatment Targeted pt using Lingraphica to make requests/needs known. When scenarios presented (wanting to go to [facility], wanting to shower, show what he wants for lunch/dinner, etc), pt navigated sections of the device with extended time, rare min A and chose an appropriate icon to convey his request. Updated "I need" section to include shower and sink bath. Pt independently used device to indicate "going out" and then to another section to show his mother's grave. Added icon in pt's outings for him to request "Visit mom." Added icons for pt to share about walking with PT today. Pt appeared excited and repeatedly selected "I'm proud of myself."   ?  ? Assessment / Recommendations / Plan  ? Plan Continue with current plan of care   ?  ? Progression Toward Goals  ? Progression toward goals Progressing  toward goals   ? ?  ?  ? ?  ? ? ? SLP Education - 04/19/21 0834   ? ? Education Details Quick talk section   ? Person(s) Educated Patient   ? Methods Explanation   ? Comprehension Verbalized understanding   ? ?  ?  ? ?  ? ? ? SLP Short Term Goals - 04/11/21 0950   ? ?  ? SLP SHORT TERM GOAL #1  ? Title Patient will comprehend simple Y/N questions >80% accuracy with written cues/ AAC support.   ? Baseline 60%   ? Time 5   ? Period Weeks   ? Status Achieved   ?  ? SLP SHORT TERM GOAL #2  ? Title Patient will indicate when he does not comprehend in >80% of trials (word to sentence level), using facial expression, gesture, or AAC.   ? Baseline 0%   ?  Time 5   ? Period Weeks   ? Status Partially Met   ?  ? SLP SHORT TERM GOAL #3  ? Title Patient will answer simple biographical questions 80% accuracy (Y/N questions or AAC).   ? Baseline 80% with extended time, repetition, supportive conversation strategies   ? Time 5   ? Period Weeks   ? Status Partially Met   ?  ? SLP SHORT TERM GOAL #4  ? Title Per pt/caregiver report, patient will use AAC to make request related to physical needs or outings at least 5/7 days with min cues.   ? Baseline Needs max cues outside of therapy   ? Time 6   ? Period --   sessions  ? Status Revised   will renew; goal not met as pt was unable to attend therapy since 1/17 due to scheduling  ? Target Date --   by visit 55  ?  ? SLP SHORT TERM GOAL #5  ? Title Pt will use AAC to reduce frustration by relaying feelings or needs during a communication breakdown with min cues, per pt/family report.   ? Baseline Patient has emotional outbursts   ? Time 6   ? Period --   sessions  ? Status Revised   will renew; goal not met as pt was unable to attend therapy since 1/17 due to scheduling  ? Target Date --   by visit 23  ? ?  ?  ? ?  ? ? ? SLP Long Term Goals - 04/11/21 0953   ? ?  ? SLP LONG TERM GOAL #1  ? Title Pt will communicate emergency information 100% accuracy, using AAC if necessary.   ? Baseline with moderate cues; progressing, will continue   ? Time 12   ? Period Weeks   ? Status Revised   will renew all LTGs; goals not met as pt was unable to attend therapy since 1/17 due to scheduling  ?  ? SLP LONG TERM GOAL #2  ? Title Patient will reduce frustration by communicating thoughts/feelings with AAC in 80% of opportunities.   ? Baseline beginning to combine facial expressions with use of AAC icons; progressing, will continue   ? Time 12   ? Period Weeks   ? Status Revised   ?  ? SLP LONG TERM GOAL #3  ? Title Pt will successfully convey requests for outings/ personal needs outside of ST >75% of the time, using AAC if necessary.   ?  Baseline able to do so in therapy sessions; ongoing training necessary for carryover  at home   ? Time 12   ? Period Weeks   ? Status Revised   ?  ? SLP LONG TERM GOAL #4  ? Title Pt will reduce social isolation risk by participating in simple conversational exchange (3-5 minutes), using AAC.   ? Baseline unable to participate meaningfully   ? Time 12   ? Period Weeks   ? Status Achieved   ? ?  ?  ? ?  ? ? ? Plan - 04/19/21 0810   ? ? 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 Visual cues (AAC icons) appear to increase pt's comprehension. Today pt navigated sections of his device with rare min cues to communicate basic wants/needs, with fewer cues necessary for distractibility. Pt initiating communication using his device to raise topics, and improving responses in role played scenarios and in communication exchanges outside of ST room (Pt reports pt used device to communicate twice in session). I recommend skilled ST with focus on improving functional communication, customizing AAC device to support pt in communicating/comprehending.   ? Speech Therapy Frequency 1x /week   ? Duration 12 weeks   ? Treatment/Interventions Language facilitation;Environmental controls;Cueing hierarchy;SLP instruction and feedback;Compensatory techniques;Cognitive reorganization;Functional tasks;Compensatory strategies;Patient/family education;Internal/external aids;Multimodal communcation approach   ? Potential to Achieve Goals Good   with AAC  ? Potential Considerations Severity of impairments;Financial resources   has good family support, appears highly motivated  ? Consulted and Agree with Plan of Care Patient   ? ?  ?  ? ?  ? ? ?Patient will benefit from skilled therapeutic intervention in order to improve the following deficits and impairments:   ?Aphasia ? ?Verbal apraxia ? ?Cognitive communication  deficit ? ? ? ?Problem List ?Patient Active Problem List  ? Diagnosis Date Noted  ? Status post placement of implantable loop recorder 11/02/2020  ? 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 Lower limb ulcer, calf, right, limited

## 2021-04-24 ENCOUNTER — Ambulatory Visit: Payer: Medicaid Other | Attending: Family Medicine

## 2021-04-24 ENCOUNTER — Ambulatory Visit: Payer: Medicaid Other | Admitting: Speech Pathology

## 2021-04-24 DIAGNOSIS — R4701 Aphasia: Secondary | ICD-10-CM | POA: Insufficient documentation

## 2021-04-24 DIAGNOSIS — M6281 Muscle weakness (generalized): Secondary | ICD-10-CM

## 2021-04-24 DIAGNOSIS — R482 Apraxia: Secondary | ICD-10-CM | POA: Insufficient documentation

## 2021-04-24 DIAGNOSIS — R2689 Other abnormalities of gait and mobility: Secondary | ICD-10-CM

## 2021-04-24 DIAGNOSIS — R2681 Unsteadiness on feet: Secondary | ICD-10-CM | POA: Diagnosis present

## 2021-04-24 DIAGNOSIS — R278 Other lack of coordination: Secondary | ICD-10-CM | POA: Diagnosis present

## 2021-04-24 DIAGNOSIS — R41841 Cognitive communication deficit: Secondary | ICD-10-CM | POA: Insufficient documentation

## 2021-04-24 DIAGNOSIS — R262 Difficulty in walking, not elsewhere classified: Secondary | ICD-10-CM

## 2021-04-26 NOTE — Therapy (Signed)
?[facility] ?[place], [place], [address] ?Phone: [phone]   Fax:  [phone] ? ?Physical Therapy Treatment/Recertification for 07/26/1605-3/71/0626 ? ?Patient Details  ?Name: Jonathan Newman ?MRN: [id number] ?Date of Birth: [birth year] ?No data recorded ? ?Encounter Date: 04/24/2021 ? ? PT End of Session - 04/26/21 0835   ? ? Visit Number 19   ? Number of Visits 25   ? Date for PT Re-Evaluation 07/17/21   ? Authorization Type Medicaid=Authorized combined PT/OT/SLP 27 visits   ? Authorization Time Period 9 authorized 10/31-12/31   ? Progress Note Due on Visit 20   ? Equipment Utilized During Treatment Gait belt   ? Activity Tolerance Patient tolerated treatment well   ? Behavior During Therapy Impulsive;WFL for tasks assessed/performed   ? ?  ?  ? ?  ? ? ?Past Medical History:  ?Diagnosis Date  ? Constipation   ? [facility] ([facility])   ? Diarrhea   ? Dysarthria and anarthria   ? Hemiplegia and hemiparesis following cerebral infarction affecting right dominant side ([place])   ? Hyperlipidemia   ? Pain in right leg   ? ? ?History reviewed. No pertinent surgical history. ? ?There were no vitals filed for this visit. ? ? Subjective Assessment - 04/26/21 0835   ? ? Subjective Patient reports doing well and was very happy after last visit as he walked in parallel bars.   ? Patient is accompained by: Family member   Sister [name]  ? Pertinent History Patient is a 61 year old male with recent history of L MCA stroke in Feb 2021.Per Dr. 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 Had developed DVT afterwards. Patient is present with sister, [name] today who reports he has been living with her since May 2022 and uses his electric w/c in the home an has not  walked since CVA- has manual w/c that he uses for community outings. She reports he requires assist with all ADLs and is impulsive but does require assist with transfers for safety- but states he does go to bathroom on his own at times.   ? Limitations Lifting;Standing;Walking;Writing;Reading;House hold activities   ? How long can you sit comfortably? no issues or limits   ? How long can you stand comfortably? < 7mn   ? How long can you walk comfortably? Has not walked since CVA   ? Patient Stated Goals Caregiver goal- to be as independent and do as much as he can on his own.   ? ?  ?  ? ?  ? ?INTERVENTIONS:  ? ?Reassessed goals for recert visit:  ? ?STG- 5 x STS= 28 sec with left UE support (GOAL MET)  ? ?FOTO= Will reassess next visit ? ?Transfers: Patient demo supervision with SPT today from wheelchair to mat and both he and his sister report he is doing this independently at home.  (GOAL MET)  ? ?Patient was able to demonstrate 2 min 43 sec of static stand ensuring he was placing approx 25% weight bearing through his Right LE. (GOAL MET)  ? ?Added gait goal as patient demo ability to walk in // bars using 1 rail with max VC and mod assist to transfer some weight over to right side - minimal WB through Right LE.  ? ? ?Patient  presents with excellent motivation for today's reassessment visit. He was able to participate well with goal testing. He demonstrated much improved functional strength as seen by his ability to complete 5x STS and in much less time. He also demo improved transfer ability- able to SPT today with supervision with some (approx 25 % weight bearing through Right LE). He was also able to walk again today in // bars with mod Physical asisst- added new goal to focus on some gait training as appropriate. Patient's condition has the potential to improve in response to therapy. Maximum improvement is yet to be obtained. The anticipated improvement is attainable and reasonable in a generally  predictable time.  ? ? ? ? ? ? ? ? ? ? ? ? ? ? ? ? PT Education - 04/26/21 0811   ? ? Education Details Exercise technique   ? Person(s) Educated Patient   ? Methods Explanation;Demonstration;Tactile cues;Verbal cues   ? Comprehension Returned demonstration;Verbalized understanding;Verbal cues required;Tactile cues required;Need further instruction   ? ?  ?  ? ?  ? ? ? PT Short Term Goals - 04/24/21 0815   ? ?  ? PT SHORT TERM GOAL #1  ? Title Pt will be independent with HEP in order to improve strength and balance in order to decrease fall risk and improve function at home and work.   ? Baseline 10/25/2020= Patient has no formal HEP in place12/8: Pt caregiver report sHEP has been going well.   ? Time 6   ? Period Weeks   ? Status Achieved   ? Target Date 12/06/20   ?  ? PT SHORT TERM GOAL #2  ? Title Patient will perform all sit to stand transfers with CGA for improved functional mobility and decreased dependence on caregivers.   ? Baseline 10/25/2020=Patient requires mod assist with all sit to stand transfers, 12/8: sister reports improved independence with most transfers, some guarding still necessary for safety, deomnstrates ability to transfer to/from nuscetp and WC as well as to/from mat table.   ? Time 6   ? Period Weeks   ? Status Achieved   ? Target Date 12/06/20   ?  ? PT SHORT TERM GOAL #3  ? Title Patient perform 5 time sit to stand in 50 seconds or less in order to indicate improved lower extremity strength and stability.   ? Baseline 01/30/21 Able to complete 2 sit to stands but only able to utilize left lower extremity has difficulty with balance upon standing.  04/24/2021= 28 sec with left UE support   ? Time 12   ? Period Weeks   ? Status Achieved   ? Target Date 04/24/21   ? ?  ?  ? ?  ? ? ? ? PT Long Term Goals - 04/24/21 1535   ? ?  ? PT LONG TERM GOAL #1  ? Title Pt will improve FOTO to target score of 43  to display perceived improvements in ability to complete ADL's.   ? Baseline 10/25/2020= 29  12/8: 33.1 01/30/21: 36; 04/24/2021= Will reassess next visit   ? Time 12   ? Period Weeks   ? Status Revised   ? Target Date 07/17/21   ?  ? PT LONG TERM GOAL #2  ? Title Patient will perform stand pivot transfer with supervision from all surfaces without loss of balance or VC for safety.   ? Baseline 10/25/2020- Patient requires Mod assist for safe transfers with min ability to place any weight  through right LE.01/30/21: able to perform without assistance but still requiries supervision as well as UE assistance on surfaces; 04/24/2021=Patient demo supervision with SPT today from wheelchair to mat and both he and his sister report he is doing this independently at home.   ? Time 12   ? Period Weeks   ? Status Achieved   ? Target Date 04/24/21   ?  ? PT LONG TERM GOAL #3  ? Title Patient will perform static stand with least restrictive assistive device > 2 min with > 25% weight bearing through right LE without LOB.   ? Baseline 12/8: able to stand for approximately 30 sec with R LE on airex pad and Min WB on the R LE and ModA from 2x PT for LE placement and to assist with weight shifting, 01/31/20: stands for 1:25 with CGA from PT for balance and with min weight through R LE (foot is on floor); 04/24/2021= Patient was able to demonstrate 2 min 43 sec of static stand ensuring he was placing approx 25% weight bearing through his Right LE.   ? Time 12   ? Period Weeks   ? Status Achieved   ? Target Date 03/22/21   ?  ? PT LONG TERM GOAL #4  ? Title Patient will perform 5 times sit to stand test and 40 seconds or less in order to indicate increased lower extremity power and ability to perform transfers.   ? Baseline 1/10/23There were to perform several repetitions of sit to stand and unable to bear weight through left lower extremity and also signs of unsteadiness when going to standing position. 04/24/2021= 28 sec with left UE support and although approx 25 % WB through Right LE   ? Time 12   ? Period Weeks   ? Status Achieved   ?  Target Date 04/24/21   ?  ? PT LONG TERM GOAL #5  ? Title Patient will ambulate with least restrictive assistive device and min assist > 25 feet without loss of balance exhibited > 25% weight bearing through Right

## 2021-05-01 ENCOUNTER — Ambulatory Visit: Payer: Medicaid Other | Admitting: Speech Pathology

## 2021-05-01 ENCOUNTER — Ambulatory Visit: Payer: Medicaid Other

## 2021-05-01 DIAGNOSIS — R482 Apraxia: Secondary | ICD-10-CM

## 2021-05-01 DIAGNOSIS — R262 Difficulty in walking, not elsewhere classified: Secondary | ICD-10-CM

## 2021-05-01 DIAGNOSIS — R2689 Other abnormalities of gait and mobility: Secondary | ICD-10-CM

## 2021-05-01 DIAGNOSIS — R2681 Unsteadiness on feet: Secondary | ICD-10-CM

## 2021-05-01 DIAGNOSIS — R4701 Aphasia: Secondary | ICD-10-CM

## 2021-05-01 DIAGNOSIS — M6281 Muscle weakness (generalized): Secondary | ICD-10-CM

## 2021-05-01 DIAGNOSIS — R41841 Cognitive communication deficit: Secondary | ICD-10-CM

## 2021-05-01 DIAGNOSIS — R278 Other lack of coordination: Secondary | ICD-10-CM

## 2021-05-01 NOTE — Therapy (Signed)
[facility] ?[place], [place], [address] ?Phone: [phone]   Fax:  [phone] ? ?Speech Language Pathology Treatment ? ?Patient Details  ?Name: Jonathan Newman ?MRN: [id number] ?Date of Birth: 1960/08/09 ?Referring Provider (SLP): Dr. Jennings Books ? ? ?Encounter Date: 05/01/2021 ? ? End of Session - 05/01/21 1807   ? ? Visit Number 18   ? Number of Visits 27   ? Date for SLP Re-Evaluation 07/09/21   ? Authorization Type Medicaid - 27 visits combined for SLP/PT; 12 ST visits auth 02/07/21-05/01/21   ? Authorization Time Period Visits used in 2023 (PT/ST comb: 10 on 04/10/21)   ? Authorization - Visit Number 2   ? Authorization - Number of Visits 12   ? Progress Note Due on Visit 10   ? Activity Tolerance Patient tolerated treatment well   ? ?  ?  ? ?  ? ? ?Past Medical History:  ?Diagnosis Date  ? Constipation   ? CVA (cerebral vascular accident) [facility])   ? Diarrhea   ? Dysarthria and anarthria   ? Hemiplegia and hemiparesis following cerebral infarction affecting right dominant side ([place])   ? Hyperlipidemia   ? Pain in right leg   ? ? ?No past surgical history on file. ? ?There were no vitals filed for this visit. ? ? Subjective Assessment - 05/01/21 1710   ? ? Subjective Pt reports he is "happy"   ? Patient is accompained by: Family member   sister [name]  ? Currently in Pain? No/denies   ? ?  ?  ? ?  ? ? ? ? ? ? ? ? ADULT SLP TREATMENT - 05/01/21 1711   ? ?  ? General Information  ? Behavior/Cognition Alert;Cooperative;Decreased sustained attention   ? HPI Jonathan Newman is a 61 y.o. male referred by Dr. Manuella Ghazi for SLP evaluation. Hx noted for DVT, HTN Pt with reported CVA in February 2021 (records from OSH are unavailable), with subsequent rehabilitation in [place]. Pt now living with his sister in [place]. Repeat MRI brain on 09/29/20 showed "Large chronic left MCA territory and left MCA/PCA watershed territory cortical/subcortical infarct, as  described. Superimposed chronic perforator infarct within the left centrum semiovale/corona radiata and left basal ganglia. Associated wallerian degeneration on the left." Chronic infarcts also within left thalamus and bilateral pons, and mild generalized cerebral and cerebellar atrophy.   ?  ? Cognitive-Linquistic Treatment  ? Treatment focused on Aphasia   ? Skilled Treatment Sister reports pt making Newman requests for needs (food, wheelchair, etc). Pt used device to indicate his feelings ("happy") and used icons in combination with facial expressions to indicate sadness (mom passing) and humor/laughter (friend Richard). Pt able to communicate some personal information for emergency. Created separate folder and pt able to communicate 80% of details for 911 call, but required mod cues for address. Pt initiating exchanges however required mod cues to use an appropriate icon to ask follow-up questions.   ?  ? Assessment / Recommendations / Plan  ? Plan Goals updated   ?  ? Progression Toward Goals  ? Progression toward goals Progressing toward goals   ? ?  ?  ? ?  ? ? ? SLP Education - 05/01/21 1711   ? ? Person(s) Educated Patient   ? Methods Explanation   ? Comprehension Verbalized understanding   ? ?  ?  ? ?  ? ? ? SLP Short Term Goals - 05/01/21 1712   ? ?  ?  SLP SHORT TERM GOAL #1  ? Title Patient will comprehend simple Y/N questions >80% accuracy with written cues/ AAC support.   ? Baseline 80%   ? Time 5   ? Period Weeks   ? Status Achieved   ?  ? SLP SHORT TERM GOAL #2  ? Title Patient will indicate when he does not comprehend in >80% of trials (word to sentence level), using facial expression, gesture, or AAC.   ? Baseline 90%   ? Time 5   ? Period Weeks   ? Status Partially Met   ?  ? SLP SHORT TERM GOAL #3  ? Title Patient will answer simple biographical questions 80% accuracy (Y/N questions or AAC).   ? Baseline 80% with extended time, repetition, supportive conversation strategies   ? Time 5   ? Period  Weeks   ? Status Partially Met   ?  ? SLP SHORT TERM GOAL #4  ? Title Per pt/caregiver report, patient will use AAC to make request related to physical needs or outings at least 5/7 days with min cues.   ? Baseline 05/01/21: sister reports pt making Newman requests   ? Time 6   ? Period --   sessions  ? Status Achieved   ? Target Date --   by visit 39  ?  ? SLP SHORT TERM GOAL #5  ? Title Pt will use AAC to reduce frustration by relaying feelings or needs during a communication breakdown with min cues, per pt/family report.   ? Baseline 01/31/21 Patient has emotional outbursts  05/01/21 Patient no longer has emotional outbursts; uses facial expressions, can use device to make common needs/feelings known but has difficulty with more abstract/less common needs   ? Time 6   ? Period --   sessions  ? Status Partially Met   ? Target Date --   by visit 42  ?  ? \  ?    ? ?  ?  ? ?  ? ? ? SLP Long Term Goals - 05/01/21 1712   ? ?  ? SLP LONG TERM GOAL #1  ? Title Pt will communicate emergency information 100% accuracy, using AAC if necessary.   ? Baseline 01/31/21 with moderate cues; progressing, will continue, 05/01/21 80%   ? Time 12   ? Period Weeks   ? Status On-going   will renew all LTGs; goals not met as pt was unable to attend therapy since 1/17 due to scheduling  ? Target Date 07/30/21   ?  ? SLP LONG TERM GOAL #2  ? Title Patient will reduce frustration by communicating thoughts/feelings with AAC in 80% of opportunities.   ? Baseline 01/31/21 beginning to combine facial expressions with use of AAC icons; progressing, will continue 05/01/21 60%   ? Time 12   ? Period Weeks   ? Status On-going   ? Target Date 07/30/21   ?  ? SLP LONG TERM GOAL #3  ? Title Pt will successfully convey requests for outings/ personal needs outside of ST >75% of the time, using AAC if necessary.   ? Baseline 01/31/21 able to do so in therapy sessions; ongoing training necessary for carryover at home 05/01/21: In sessions 100%, at home 50%   ?  Time 12   ? Period Weeks   ? Status On-going   ? Target Date 07/30/21   ?  ? SLP LONG TERM GOAL #4  ? Title Pt will reduce social isolation risk by  participating in simple conversational exchange (3-5 minutes), using AAC.   ? Baseline --   ? Time 12   ? Period Weeks   ? Status Achieved   ? Target Date --   ? ?  ?  ? ?  ? ? ? Plan - 05/01/21 1807   ? ? 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 Johnavon has made excellent progress using his device to communicate basic wants/needs as well as to initiate communication exchanges, and share feelings. He is progressing toward LTGs, and met 1/2 active short term goals since recert on 03/03/13. I recommend continuing skilled ST with focus on improving functional communication, customizing AAC device to support pt in communicating/comprehending.   ? Speech Therapy Frequency 1x /week   ? Duration 12 weeks   ? Treatment/Interventions Language facilitation;Environmental controls;Cueing hierarchy;SLP instruction and feedback;Compensatory techniques;Cognitive reorganization;Functional tasks;Compensatory strategies;Patient/family education;Internal/external aids;Multimodal communcation approach   ? Potential to Achieve Goals Good   with AAC  ? Potential Considerations Severity of impairments;Financial resources   has good family support, appears highly motivated  ? Consulted and Agree with Plan of Care Patient   ? ?  ?  ? ?  ? ? ?Patient will benefit from skilled therapeutic intervention in order to improve the following deficits and impairments:   ?Aphasia ? ?Verbal apraxia ? ?Cognitive communication deficit ? ? ? ?Problem List ?Patient Active Problem List  ? Diagnosis Date Noted  ? Status post placement of implantable loop recorder 11/02/2020  ? 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 Lower limb ulcer, calf, right, limited to breakdown of skin ([place]) 03/07/2020  ? Stroke  ([place]) 03/07/2020  ? DVT (deep venous thrombosis) ([place]) 03/07/2020  ? ?Deneise Lever, MS, CCC-SLP ?Speech-Language Pathologist ?[phone] ? ?Aliene Altes, CCC-SLP ?05/01/2021, 6:14 PM ? ?[place] ?[facility]

## 2021-05-01 NOTE — Therapy (Signed)
[place] [facility] ?[address] ?[place], [place], [address] ?Phone: [phone]   Fax:  [phone] ? ?Physical Therapy Treatment/Physical Therapy Progress Note ? ? ?Dates of reporting period  12/28/2020  to   05/01/2021 ? ?Patient Details  ?Name: Jonathan Newman ?MRN: [id number] ?Date of Birth: 13-Jun-1960 ?No data recorded ? ?Encounter Date: 05/01/2021 ? ? PT End of Session - 05/01/21 1714   ? ? Visit Number 20   ? Number of Visits 25   ? Date for PT Re-Evaluation 07/17/21   ? Authorization Type Medicaid=Authorized combined PT/OT/SLP 27 visits   ? Authorization Time Period 9 authorized 10/31-12/31   ? Progress Note Due on Visit 30   ? PT Start Time 1516   ? PT Stop Time 1558   ? PT Time Calculation (min) 42 min   ? Equipment Utilized During Treatment Gait belt   ? Activity Tolerance Patient tolerated treatment well   ? Behavior During Therapy Impulsive;WFL for tasks assessed/performed   ? ?  ?  ? ?  ? ? ?Past Medical History:  ?Diagnosis Date  ? Constipation   ? CVA (cerebral vascular accident) [facility])   ? Diarrhea   ? Dysarthria and anarthria   ? Hemiplegia and hemiparesis following cerebral infarction affecting right dominant side ([facility])   ? Hyperlipidemia   ? Pain in right leg   ? ? ?History reviewed. No pertinent surgical history. ? ?There were no vitals filed for this visit. ? ? Subjective Assessment - 05/01/21 1713   ? ? Subjective Patient reports he was diagnosed with Shingles. States doing well without pain. Patient reports he wants to practice walking.   ? Patient is accompained by: Family member   Sister [name]  ? Pertinent History Patient is a 61 year old male with recent history of L MCA stroke in Feb 2021.Per Dr. 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 Had developed  DVT afterwards. Patient is present with sister, [name] today who reports he has been living with her since May 2022 and uses his electric w/c in the home an has not walked since CVA- has manual w/c that he uses for community outings. She reports he requires assist with all ADLs and is impulsive but does require assist with transfers for safety- but states he does go to bathroom on his own at times.   ? Limitations Lifting;Standing;Walking;Writing;Reading;House hold activities   ? How long can you sit comfortably? no issues or limits   ? How long can you stand comfortably? <   ? How long can you walk comfortably? Has not walked since CVA   ? Patient Stated Goals Caregiver goal- to be as independent and do as much as he can on his own.   ? Currently in Pain? No/denies   ? ?  ?  ? ?  ? ?*Patient due for progress note- however goals were just assessed last visit for recertification.  ? ?Interventions: ? ? ?Therapeutic activity: Sit to stand x multiple attempts - Still only VC for hand placement and to utilize right LE to assist in standing.  ?  ?Patient was able to demonstrate static stand  x several trials at 1-2 min each- placing approx 25%-50%  weight bearing through his Right LE with max VC and physical assist - poor quad control.  ?  ?  Gait training in // bars:  ?Patient ambulated in // bars down and back x 4-  using lett hand on railing and short step to gait with very minimal Right LE weight bearing using 5 lb AW on right LE He exhibited poor quad activation. Despite max VC and moderate physical assist - he presented with forward flexed posture and minimal ability to advance right LE or apply more than 25% WB.  ? ?Education provided throughout session via VC/TC and demonstration to facilitate movement at target joints and correct muscle activation for all testing and exercises performed.  ? ? ? ? ? ? ? ? ? ? ? ? ? ? ? ? ? ? ? ? ? ? ? ? ? ? PT Education - 05/01/21 1714   ? ? Education Details Exercise  technique   ? Person(s) Educated Patient   ? Methods Explanation;Demonstration;Tactile cues;Verbal cues   ? Comprehension Verbalized understanding;Returned demonstration;Verbal cues required;Tactile cues required;Need further instruction   ? ?  ?  ? ?  ? ? ? PT Short Term Goals - 04/24/21 0815   ? ?  ? PT SHORT TERM GOAL #1  ? Title Pt will be independent with HEP in order to improve strength and balance in order to decrease fall risk and improve function at home and work.   ? Baseline 10/25/2020= Patient has no formal HEP in place12/8: Pt caregiver report sHEP has been going well.   ? Time 6   ? Period Weeks   ? Status Achieved   ? Target Date 12/06/20   ?  ? PT SHORT TERM GOAL #2  ? Title Patient will perform all sit to stand transfers with CGA for improved functional mobility and decreased dependence on caregivers.   ? Baseline 10/25/2020=Patient requires mod assist with all sit to stand transfers, 12/8: sister reports improved independence with most transfers, some guarding still necessary for safety, deomnstrates ability to transfer to/from nuscetp and WC as well as to/from mat table.   ? Time 6   ? Period Weeks   ? Status Achieved   ? Target Date 12/06/20   ?  ? PT SHORT TERM GOAL #3  ? Title Patient perform 5 time sit to stand in 50 seconds or less in order to indicate improved lower extremity strength and stability.   ? Baseline 01/30/21 Able to complete 2 sit to stands but only able to utilize left lower extremity has difficulty with balance upon standing.  04/24/2021= 28 sec with left UE support   ? Time 12   ? Period Weeks   ? Status Achieved   ? Target Date 04/24/21   ? ?  ?  ? ?  ? ? ? ? PT Long Term Goals - 04/24/21 1535   ? ?  ? PT LONG TERM GOAL #1  ? Title Pt will improve FOTO to target score of 43  to display perceived improvements in ability to complete ADL's.   ? Baseline 10/25/2020= 29 12/8: 33.1 01/30/21: 36; 04/24/2021= Will reassess next visit   ? Time 12   ? Period Weeks   ? Status Revised   ? Target  Date 07/17/21   ?  ? PT LONG TERM GOAL #2  ? Title Patient will perform stand pivot transfer with supervision from all surfaces without loss of balance or VC for safety.   ? Baseline 10/25/2020- Patient requires Mod assist for safe transfers with min ability to place any weight through right LE.01/30/21: able to perform  without assistance but still requiries supervision as well as UE assistance on surfaces; 04/24/2021=Patient demo supervision with SPT today from wheelchair to mat and both he and his sister report he is doing this independently at home.   ? Time 12   ? Period Weeks   ? Status Achieved   ? Target Date 04/24/21   ?  ? PT LONG TERM GOAL #3  ? Title Patient will perform static stand with least restrictive assistive device > 2 min with > 25% weight bearing through right LE without LOB.   ? Baseline 12/8: able to stand for approximately 30 sec with R LE on airex pad and Min WB on the R LE and ModA from 2x PT for LE placement and to assist with weight shifting, 01/31/20: stands for 1:25 with CGA from PT for balance and with min weight through R LE (foot is on floor); 04/24/2021= Patient was able to demonstrate 2 min 43 sec of static stand ensuring he was placing approx 25% weight bearing through his Right LE.   ? Time 12   ? Period Weeks   ? Status Achieved   ? Target Date 03/22/21   ?  ? PT LONG TERM GOAL #4  ? Title Patient will perform 5 times sit to stand test and 40 seconds or less in order to indicate increased lower extremity power and ability to perform transfers.   ? Baseline 1/10/23There were to perform several repetitions of sit to stand and unable to bear weight through left lower extremity and also signs of unsteadiness when going to standing position. 04/24/2021= 28 sec with left UE support and although approx 25 % WB through Right LE   ? Time 12   ? Period Weeks   ? Status Achieved   ? Target Date 04/24/21   ?  ? PT LONG TERM GOAL #5  ? Title Patient will ambulate with least restrictive assistive  device and min assist > 25 feet without loss of balance exhibited > 25% weight bearing through Right LE for improved functional mobility in home.   ? Baseline 04/24/2021- patient demo ability to walk in // bars us

## 2021-05-08 ENCOUNTER — Ambulatory Visit: Payer: Medicaid Other | Admitting: Speech Pathology

## 2021-05-08 ENCOUNTER — Ambulatory Visit: Payer: Medicaid Other

## 2021-05-08 DIAGNOSIS — R278 Other lack of coordination: Secondary | ICD-10-CM

## 2021-05-08 DIAGNOSIS — R2689 Other abnormalities of gait and mobility: Secondary | ICD-10-CM

## 2021-05-08 DIAGNOSIS — M6281 Muscle weakness (generalized): Secondary | ICD-10-CM

## 2021-05-08 DIAGNOSIS — R2681 Unsteadiness on feet: Secondary | ICD-10-CM

## 2021-05-08 DIAGNOSIS — R262 Difficulty in walking, not elsewhere classified: Secondary | ICD-10-CM

## 2021-05-08 DIAGNOSIS — R41841 Cognitive communication deficit: Secondary | ICD-10-CM

## 2021-05-08 DIAGNOSIS — R4701 Aphasia: Secondary | ICD-10-CM

## 2021-05-08 DIAGNOSIS — R482 Apraxia: Secondary | ICD-10-CM

## 2021-05-08 NOTE — Therapy (Signed)
[place] [facility] ?[place], [place], [address] ?Phone: [phone]   Fax:  [phone] ? ?Speech Language Pathology Treatment ? ?Patient Details  ?Name: Jonathan Newman ?MRN: [id number] ?Date of Birth: [birth year] ?Referring Provider (SLP): Dr. Jennings Books ? ? ?Encounter Date: 05/08/2021 ? ? End of Session - 05/08/21 1618   ? ? Visit Number 19   ? Number of Visits 27   ? Date for SLP Re-Evaluation 07/09/21   ? Authorization Type Medicaid - 27 visits combined for SLP/PT; 12 ST visits auth 02/07/21-05/01/21   ? Authorization Time Period Visits used in 2023 (PT/ST comb: 10 on 04/10/21)   ? Authorization - Visit Number 3   ? Authorization - Number of Visits 12   ? Progress Note Due on Visit 10   ? Activity Tolerance Patient tolerated treatment well   ? ?  ?  ? ?  ? ? ?Past Medical History:  ?Diagnosis Date  ? Constipation   ? CVA (cerebral vascular accident) [facility])   ? Diarrhea   ? Dysarthria and anarthria   ? Hemiplegia and hemiparesis following cerebral infarction affecting right dominant side ([place])   ? Hyperlipidemia   ? Pain in right leg   ? ? ?No past surgical history on file. ? ?There were no vitals filed for this visit. ? ? Subjective Assessment - 05/08/21 1608   ? ? Subjective "I'm happy. I'm fine. I like that." (using feelings section on his [place])   ? Currently in Pain? No/denies   ? ?  ?  ? ?  ? ? ? ? ? ? ? ? ADULT SLP TREATMENT - 05/08/21 1609   ? ?  ? General Information  ? Behavior/Cognition Alert;Cooperative;Decreased sustained attention   ? HPI Jonathan Newman is a 61 y.o. male referred by Dr. Manuella Ghazi for SLP evaluation. Hx noted for DVT, HTN Pt with reported CVA in February 2021 (records from OSH are unavailable), with subsequent rehabilitation in [place]. Pt now living with his sister in [place]. Repeat MRI brain on 09/29/20 showed "Large chronic left MCA territory and left MCA/PCA watershed territory cortical/subcortical infarct, as described.  Superimposed chronic perforator infarct within the left centrum semiovale/corona radiata and left basal ganglia. Associated wallerian degeneration on the left." Chronic infarcts also within left thalamus and bilateral pons, and mild generalized cerebral and cerebellar atrophy.   ?  ? Cognitive-Linquistic Treatment  ? Treatment focused on Aphasia   ? Skilled Treatment Targeted use of device to communicate feelings and give more information in communication breakdowns in functional scenarios (eg. Sad that [place] is leaving, mad because [name] didn't understand you wanted [employer], happy about talking with friend Nicki Reaper, sad and wanting to visit mom's grave). Pt answered questions in sequence "How do you feel?" "Who/what does it have to do with?" with appropriate responses ~80% of the time, occasional repetition and redirection (gesture to location of "who" section).  During this task, pt had actual communication breakdown with sister; pt able to convey feelings (frustrated) that his request was not understood, but required max cues to scan device to add additional details. Pt used "when" section to show "Wednesday" and was able to convey with calendar that he was referencing "tomorrow" but unable to provide additional reliable information despite narrowing and Y/N questions. Modeled using "I need a break" and gave pt choice of further attempting to resolve breakdown or changing topic, and pt chose to change the topic.   ?  ? Assessment /  Recommendations / Plan  ? Plan Continue with current plan of care   ?  ? Progression Toward Goals  ? Progression toward goals Progressing toward goals   ? ?  ?  ? ?  ? ? ? SLP Education - 05/08/21 1617   ? ? Education Details Using who/what/where/when in communication breakdown   ? Person(s) Educated Patient   ? Methods Explanation   ? Comprehension Verbalized understanding   ? ?  ?  ? ?  ? ? ? SLP Short Term Goals - 05/01/21 1712   ? ?  ? SLP SHORT TERM GOAL #1  ? Title Patient will  comprehend simple Y/N questions >80% accuracy with written cues/ AAC support.   ? Baseline 80%   ? Time 5   ? Period Weeks   ? Status Achieved   ?  ? SLP SHORT TERM GOAL #2  ? Title Patient will indicate when he does not comprehend in >80% of trials (word to sentence level), using facial expression, gesture, or AAC.   ? Baseline 90%   ? Time 5   ? Period Weeks   ? Status Partially Met   ?  ? SLP SHORT TERM GOAL #3  ? Title Patient will answer simple biographical questions 80% accuracy (Y/N questions or AAC).   ? Baseline 80% with extended time, repetition, supportive conversation strategies   ? Time 5   ? Period Weeks   ? Status Partially Met   ?  ? SLP SHORT TERM GOAL #4  ? Title Per pt/caregiver report, patient will use AAC to make request related to physical needs or outings at least 5/7 days with min cues.   ? Baseline 05/01/21: sister reports pt making daily requests   ? Time 6   ? Period --   sessions  ? Status Achieved   ? Target Date --   by visit 58  ?  ? SLP SHORT TERM GOAL #5  ? Title Pt will use AAC to reduce frustration by relaying feelings or needs during a communication breakdown with min cues, per pt/family report.   ? Baseline 01/31/21 Patient has emotional outbursts  05/01/21 Patient no longer has emotional outbursts; uses facial expressions, can use device to make common needs/feelings known but has difficulty with more abstract/less common needs   ? Time 6   ? Period --   sessions  ? Status Partially Met   ? Target Date --   by visit 25  ?  ? Additional Short Term Goals  ? Additional Short Term Goals --   ? ?  ?  ? ?  ? ? ? SLP Long Term Goals - 05/01/21 1712   ? ?  ? SLP LONG TERM GOAL #1  ? Title Pt will communicate emergency information 100% accuracy, using AAC if necessary.   ? Baseline 01/31/21 with moderate cues; progressing, will continue, 05/01/21 80%   ? Time 12   ? Period Weeks   ? Status On-going   will renew all LTGs; goals not met as pt was unable to attend therapy since 1/17 due to  scheduling  ? Target Date 07/30/21   ?  ? SLP LONG TERM GOAL #2  ? Title Patient will reduce frustration by communicating thoughts/feelings with AAC in 80% of opportunities.   ? Baseline 01/31/21 beginning to combine facial expressions with use of AAC icons; progressing, will continue 05/01/21 60%   ? Time 12   ? Period Weeks   ? Status  On-going   ? Target Date 07/30/21   ?  ? SLP LONG TERM GOAL #3  ? Title Pt will successfully convey requests for outings/ personal needs outside of ST >75% of the time, using AAC if necessary.   ? Baseline 01/31/21 able to do so in therapy sessions; ongoing training necessary for carryover at home 05/01/21: In sessions 100%, at home 50%   ? Time 12   ? Period Weeks   ? Status On-going   ? Target Date 07/30/21   ?  ? SLP LONG TERM GOAL #4  ? Title Pt will reduce social isolation risk by participating in simple conversational exchange (3-5 minutes), using AAC.   ? Baseline --   ? Time 12   ? Period Weeks   ? Status Achieved   ? Target Date --   ? ?  ?  ? ?  ? ? ? Plan - 05/08/21 1618   ? ? 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 Jonathan Newman has made excellent progress using his device to communicate basic wants/needs as well as to initiate communication exchanges, and share feelings. Pt demonstrated increased ability to communicate feelings and use his device to answer [facility] questions in simulated communication breakdowns; during actual breakdown pt required max cues. He is progressing toward LTGs, and met 1/2 active short term goals since recert on 4/59/97. I recommend continuing skilled ST with focus on improving functional communication, customizing AAC device to support pt in communicating/comprehending.   ? Speech Therapy Frequency 1x /week   ? Duration 12 weeks   ? Treatment/Interventions Language facilitation;Environmental controls;Cueing hierarchy;SLP instruction and  feedback;Compensatory techniques;Cognitive reorganization;Functional tasks;Compensatory strategies;Patient/family education;Internal/external aids;Multimodal communcation approach   ? Potential to Achieve Goals G

## 2021-05-09 NOTE — Therapy (Signed)
[place] [facility] ?[address] ?[place], [place], [address] ?Phone: [phone]   Fax:  [phone] ? ?Physical Therapy Treatment ? ?Patient Details  ?Name: Jonathan Newman ?MRN: [id number] ?Date of Birth: 01-Mar-1960 ?No data recorded ? ?Encounter Date: 05/08/2021 ? ? PT End of Session - 05/08/21 1714   ? ? Visit Number 21   ? Number of Visits 25   ? Date for PT Re-Evaluation 07/17/21   ? Authorization Type Medicaid=Authorized combined PT/OT/SLP 27 visits   ? Authorization Time Period 9 authorized 10/31-12/31   ? Progress Note Due on Visit 30   ? PT Start Time 1508   ? PT Stop Time 1549   ? PT Time Calculation (min) 41 min   ? Equipment Utilized During Treatment Gait belt   ? Activity Tolerance Patient tolerated treatment well   ? Behavior During Therapy Impulsive;WFL for tasks assessed/performed   ? ?  ?  ? ?  ? ? ?Past Medical History:  ?Diagnosis Date  ? Constipation   ? CVA (cerebral vascular accident) [facility])   ? Diarrhea   ? Dysarthria and anarthria   ? Hemiplegia and hemiparesis following cerebral infarction affecting right dominant side ([facility])   ? Hyperlipidemia   ? Pain in right leg   ? ? ?No past surgical history on file. ? ?There were no vitals filed for this visit. ? ? Subjective Assessment - 05/08/21 1713   ? ? Subjective Patient reports doing okay with no significant issues. Denied any pain and no falls.   ? Patient is accompained by: Family member   Sister [name]  ? Pertinent History Patient is a 61 year old male with recent history of L MCA stroke in Feb 2021.Per Dr. 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 Had developed DVT afterwards. Patient is present with sister, [name] today who reports he has been living with her since May 2022 and uses his electric w/c in  the home an has not walked since CVA- has manual w/c that he uses for community outings. She reports he requires assist with all ADLs and is impulsive but does require assist with transfers for safety- but states he does go to bathroom on his own at times.   ? Limitations Lifting;Standing;Walking;Writing;Reading;House hold activities   ? How long can you sit comfortably? no issues or limits   ? How long can you stand comfortably? <   ? How long can you walk comfortably? Has not walked since CVA   ? Patient Stated Goals Caregiver goal- to be as independent and do as much as he can on his own.   ? Currently in Pain? No/denies   ? ?  ?  ? ?  ? ? ?INTERVENTIONS: ? ?Gait training: in // bars:  ?Patient ambulated in // bars down and back x 7 -  using lett hand on railing and short step to gait with very minimal Right LE weight bearing. He exhibited poor quad activation yet able to accept more weight with practice- Min-MOD physical assist yet continued VC.  ? ?Static standing: in front of mirror -  ?- Lateral weight shifting over to right side and attempting to lock knee in extension.  ? ?Step tap - Focusing on shifting weight to weaker right and perform step tap with left LE- Holding  onto left rail- max effort x 4 reps.  ?  ?Education provided throughout session via VC/TC and demonstration to facilitate movement at target joints and correct muscle activation for all testing and exercises performed. ? ? ? ? ? ? ? ? ? ? ? ? ? ? ? ? ? ? ? ? ? ? ? ? ? ? ? ? PT Education - 05/09/21 1714   ? ? Education Details Gait sequencing   ? Person(s) Educated Patient   ? Methods Explanation;Demonstration;Tactile cues;Verbal cues   ? Comprehension Verbalized understanding;Returned demonstration;Tactile cues required;Verbal cues required;Need further instruction   ? ?  ?  ? ?  ? ? ? PT Short Term Goals - 04/24/21 0815   ? ?  ? PT SHORT TERM GOAL #1  ? Title Pt will be independent with HEP in order to improve strength and balance in  order to decrease fall risk and improve function at home and work.   ? Baseline 10/25/2020= Patient has no formal HEP in place12/8: Pt caregiver report sHEP has been going well.   ? Time 6   ? Period Weeks   ? Status Achieved   ? Target Date 12/06/20   ?  ? PT SHORT TERM GOAL #2  ? Title Patient will perform all sit to stand transfers with CGA for improved functional mobility and decreased dependence on caregivers.   ? Baseline 10/25/2020=Patient requires mod assist with all sit to stand transfers, 12/8: sister reports improved independence with most transfers, some guarding still necessary for safety, deomnstrates ability to transfer to/from nuscetp and WC as well as to/from mat table.   ? Time 6   ? Period Weeks   ? Status Achieved   ? Target Date 12/06/20   ?  ? PT SHORT TERM GOAL #3  ? Title Patient perform 5 time sit to stand in 50 seconds or less in order to indicate improved lower extremity strength and stability.   ? Baseline 01/30/21 Able to complete 2 sit to stands but only able to utilize left lower extremity has difficulty with balance upon standing.  04/24/2021= 28 sec with left UE support   ? Time 12   ? Period Weeks   ? Status Achieved   ? Target Date 04/24/21   ? ?  ?  ? ?  ? ? ? ? PT Long Term Goals - 04/24/21 1535   ? ?  ? PT LONG TERM GOAL #1  ? Title Pt will improve FOTO to target score of 43  to display perceived improvements in ability to complete ADL's.   ? Baseline 10/25/2020= 29 12/8: 33.1 01/30/21: 36; 04/24/2021= Will reassess next visit   ? Time 12   ? Period Weeks   ? Status Revised   ? Target Date 07/17/21   ?  ? PT LONG TERM GOAL #2  ? Title Patient will perform stand pivot transfer with supervision from all surfaces without loss of balance or VC for safety.   ? Baseline 10/25/2020- Patient requires Mod assist for safe transfers with min ability to place any weight through right LE.01/30/21: able to perform without assistance but still requiries supervision as well as UE assistance on surfaces;  04/24/2021=Patient demo supervision with SPT today from wheelchair to mat and both he and his sister report he is doing this independently at home.   ? Time 12   ? Period Weeks   ? Status Achieved   ? Target Date 04/24/21   ?  ?  PT LONG TERM GOAL #3  ? Title Patient will perform static stand with least restrictive assistive device > 2 min with > 25% weight bearing through right LE without LOB.   ? Baseline 12/8: able to stand for approximately 30 sec with R LE on airex pad and Min WB on the R LE and ModA from 2x PT for LE placement and to assist with weight shifting, 01/31/20: stands for 1:25 with CGA from PT for balance and with min weight through R LE (foot is on floor); 04/24/2021= Patient was able to demonstrate 2 min 43 sec of static stand ensuring he was placing approx 25% weight bearing through his Right LE.   ? Time 12   ? Period Weeks   ? Status Achieved   ? Target Date 03/22/21   ?  ? PT LONG TERM GOAL #4  ? Title Patient will perform 5 times sit to stand test and 40 seconds or less in order to indicate increased lower extremity power and ability to perform transfers.   ? Baseline 1/10/23There were to perform several repetitions of sit to stand and unable to bear weight through left lower extremity and also signs of unsteadiness when going to standing position. 04/24/2021= 28 sec with left UE support and although approx 25 % WB through Right LE   ? Time 12   ? Period Weeks   ? Status Achieved   ? Target Date 04/24/21   ?  ? PT LONG TERM GOAL #5  ? Title Patient will ambulate with least restrictive assistive device and min assist > 25 feet without loss of balance exhibited > 25% weight bearing through Right LE for improved functional mobility in home.   ? Baseline 04/24/2021- patient demo ability to walk in // bars using 1 rail with max VC and mod assist to transfer some weight over to right side - minimal WB through Right LE.   ? Time 12   ? Period Weeks   ? Status New   ? Target Date 07/17/21   ?  ? Additional  Long Term Goals  ? Additional Long Term Goals Yes   ?  ? PT LONG TERM GOAL #6  ? Title Patient will perform static stand with least restrictive assistive device > 2 min with 50% weight bearing through right

## 2021-05-15 ENCOUNTER — Ambulatory Visit: Payer: Medicaid Other | Admitting: Physical Therapy

## 2021-05-15 ENCOUNTER — Ambulatory Visit: Payer: Medicaid Other | Admitting: Speech Pathology

## 2021-05-15 ENCOUNTER — Encounter: Payer: Self-pay | Admitting: Physical Therapy

## 2021-05-15 DIAGNOSIS — R2681 Unsteadiness on feet: Secondary | ICD-10-CM

## 2021-05-15 DIAGNOSIS — R262 Difficulty in walking, not elsewhere classified: Secondary | ICD-10-CM | POA: Diagnosis not present

## 2021-05-15 DIAGNOSIS — M6281 Muscle weakness (generalized): Secondary | ICD-10-CM

## 2021-05-15 DIAGNOSIS — R482 Apraxia: Secondary | ICD-10-CM

## 2021-05-15 DIAGNOSIS — R4701 Aphasia: Secondary | ICD-10-CM

## 2021-05-15 DIAGNOSIS — R2689 Other abnormalities of gait and mobility: Secondary | ICD-10-CM

## 2021-05-15 NOTE — Therapy (Signed)
[place] ?[facility] ?[address] ?[place], [place], [address] ?Phone: [phone]   Fax:  [phone] ? ?Physical Therapy Treatment ? ?Patient Details  ?Name: Jonathan Newman ?MRN: [id number] ?Date of Birth: [birth year] ?No data recorded ? ?Encounter Date: 05/15/2021 ? ? PT End of Session - 05/15/21 1530   ? ? Visit Number 22   ? Number of Visits 25   ? Date for PT Re-Evaluation 07/17/21   ? Authorization Type Medicaid=Authorized combined PT/OT/SLP 27 visits   ? Authorization Time Period 9 authorized 10/31-12/31   ? Progress Note Due on Visit 30   ? PT Start Time 1517   ? PT Stop Time 1558   ? PT Time Calculation (min) 41 min   ? Equipment Utilized During Treatment Gait belt   ? Activity Tolerance Patient tolerated treatment well   ? Behavior During Therapy Impulsive;WFL for tasks assessed/performed   ? ?  ?  ? ?  ? ? ?Past Medical History:  ?Diagnosis Date  ? Constipation   ? CVA (cerebral vascular accident) [facility])   ? Diarrhea   ? Dysarthria and anarthria   ? Hemiplegia and hemiparesis following cerebral infarction affecting right dominant side (HCC)   ? Hyperlipidemia   ? Pain in right leg   ? ? ?History reviewed. No pertinent surgical history. ? ?There were no vitals filed for this visit. ? ? Subjective Assessment - 05/15/21 1523   ? ? Subjective Patient reports doing well with no significant changes. Reports he fells better after having shingles ( per caregiver)   ? Patient is accompained by: Family member   Sister [name]  ? Pertinent History Patient is a 61 year old male with recent history of L MCA stroke in Feb 2021.Per Dr. 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 Had developed DVT afterwards. Patient is present with sister, [name] today who reports he has been living with  her since May 2022 and uses his electric w/c in the home an has not walked since CVA- has manual w/c that he uses for community outings. She reports he requires assist with all ADLs and is impulsive but does require assist with transfers for safety- but states he does go to bathroom on his own at times.   ? Limitations Lifting;Standing;Walking;Writing;Reading;House hold activities   ? How long can you sit comfortably? no issues or limits   ? How long can you stand comfortably? <   ? How long can you walk comfortably? Has not walked since CVA   ? Patient Stated Goals Caregiver goal- to be as independent and do as much as he can on his own.   ? Currently in Pain? No/denies   ? ?  ?  ? ?  ? ? ? ?INTERVENTIONS: ?Therex: Nustep level 2 with R LE leg stabalizer in place to prevent ER of R LE.  ?2 x 3 min with 1 minute rest break.  ? ?Gait training: in // bars:  ?Patient ambulated in // x 3 laps  -  using left hand on railing and short step to gait with very minimal Right LE weight bearing. He exhibited poor quad activation yet able to accept more weight with practice- Min-MOD physical assist yet continued VC.  ? ?Static standing: in front of mirror -  ?- Lateral weight shifting  over to right side and attempting to lock knee in extension. Mod A from PT to keep R knee locked in extension  ? ?Step tap - Focusing on shifting weight to weaker right and perform step tap with left LE- Holding onto left rail- max effort x 2 reps.  ?  ?Pt required occasional rest breaks due fatigue, PT was quick to ask when pt appeared to be fatiguing in order to prevent excessive fatigue. ?- pt had one incident where he stared off into space and had difficulty with focussing on questions caregiver is asking him. Caregiver reports this happens occasionally and only lasts a couple of minutes.  ? ?Education provided throughout session via VC/TC and demonstration to facilitate movement at target joints and correct muscle activation for all  testing and exercises performed. ? ? ? ? ? ? ? ? ? ? ? ? ? ? ? ? ? ? ? ? ? ? ? ? ? ? ? ? PT Education - 05/15/21 1524   ? ? Education Details exercise technuique   ? Person(s) Educated Patient   ? Methods Explanation;Demonstration;Tactile cues   ? Comprehension Verbalized understanding;Returned demonstration;Verbal cues required   ? ?  ?  ? ?  ? ? ? PT Short Term Goals - 04/24/21 0815   ? ?  ? PT SHORT TERM GOAL #1  ? Title Pt will be independent with HEP in order to improve strength and balance in order to decrease fall risk and improve function at home and work.   ? Baseline 10/25/2020= Patient has no formal HEP in place12/8: Pt caregiver report sHEP has been going well.   ? Time 6   ? Period Weeks   ? Status Achieved   ? Target Date 12/06/20   ?  ? PT SHORT TERM GOAL #2  ? Title Patient will perform all sit to stand transfers with CGA for improved functional mobility and decreased dependence on caregivers.   ? Baseline 10/25/2020=Patient requires mod assist with all sit to stand transfers, 12/8: sister reports improved independence with most transfers, some guarding still necessary for safety, deomnstrates ability to transfer to/from nuscetp and WC as well as to/from mat table.   ? Time 6   ? Period Weeks   ? Status Achieved   ? Target Date 12/06/20   ?  ? PT SHORT TERM GOAL #3  ? Title Patient perform 5 time sit to stand in 50 seconds or less in order to indicate improved lower extremity strength and stability.   ? Baseline 01/30/21 Able to complete 2 sit to stands but only able to utilize left lower extremity has difficulty with balance upon standing.  04/24/2021= 28 sec with left UE support   ? Time 12   ? Period Weeks   ? Status Achieved   ? Target Date 04/24/21   ? ?  ?  ? ?  ? ? ? ? PT Long Term Goals - 04/24/21 1535   ? ?  ? PT LONG TERM GOAL #1  ? Title Pt will improve FOTO to target score of 43  to display perceived improvements in ability to complete ADL's.   ? Baseline 10/25/2020= 29 12/8: 33.1 01/30/21: 36;  04/24/2021= Will reassess next visit   ? Time 12   ? Period Weeks   ? Status Revised   ? Target Date 07/17/21   ?  ? PT LONG TERM GOAL #2  ? Title Patient will perform stand pivot transfer with supervision from all  surfaces without loss of balance or VC for safety.   ? Baseline 10/25/2020- Patient requires Mod assist for safe transfers with min ability to place any weight through right LE.01/30/21: able to perform without assistance but still requiries supervision as well as UE assistance on surfaces; 04/24/2021=Patient demo supervision with SPT today from wheelchair to mat and both he and his sister report he is doing this independently at home.   ? Time 12   ? Period Weeks   ? Status Achieved   ? Target Date 04/24/21   ?  ? PT LONG TERM GOAL #3  ? Title Patient will perform static stand with least restrictive assistive device > 2 min with > 25% weight bearing through right LE without LOB.   ? Baseline 12/8: able to stand for approximately 30 sec with R LE on airex pad and Min WB on the R LE and ModA from 2x PT for LE placement and to assist with weight shifting, 01/31/20: stands for 1:25 with CGA from PT for balance and with min weight through R LE (foot is on floor); 04/24/2021= Patient was able to demonstrate 2 min 43 sec of static stand ensuring he was placing approx 25% weight bearing through his Right LE.   ? Time 12   ? Period Weeks   ? Status Achieved   ? Target Date 03/22/21   ?  ? PT LONG TERM GOAL #4  ? Title Patient will perform 5 times sit to stand test and 40 seconds or less in order to indicate increased lower extremity power and ability to perform transfers.   ? Baseline 1/10/23There were to perform several repetitions of sit to stand and unable to bear weight through left lower extremity and also signs of unsteadiness when going to standing position. 04/24/2021= 28 sec with left UE support and although approx 25 % WB through Right LE   ? Time 12   ? Period Weeks   ? Status Achieved   ? Target Date 04/24/21    ?  ? PT LONG TERM GOAL #5  ? Title Patient will ambulate with least restrictive assistive device and min assist > 25 feet without loss of balance exhibited > 25% weight bearing through Right LE for improv

## 2021-05-16 NOTE — Therapy (Signed)
[place] ?[facility] ?[place], [place], [address] ?Phone: [phone]   Fax:  [phone] ? ?Speech Language Pathology Treatment and Progress Note ? ?Patient Details  ?Name: Jonathan Newman ?MRN: [id number] ?Date of Birth: [birth year] ?Referring Provider (SLP): Dr. Jennings Books ? ?Speech Therapy Progress Note ? ?Dates of Reporting Period: 12/13/2020 to 05/15/2021 ? ?Objective: Patient has been seen for 10 speech therapy sessions this reporting period targeting aphasia, apraxia and use of AAC. Patient is making progress toward LTGs (met 1/4, new goal added) and met 1/2, partially met 1/2 active STGs this reporting period. See skilled intervention, clinical impressions, and goals below for details. ? ?Encounter Date: 05/15/2021 ? ? End of Session - 05/16/21 0920   ? ? Visit Number 20   ? Number of Visits 27   ? Date for SLP Re-Evaluation 07/09/21   ? Authorization Type Medicaid - 27 visits combined for SLP/PT; 12 ST visits auth 02/07/21-05/01/21   ? Authorization Time Period Visits used in 2023 (PT/ST comb: 10 on 04/10/21)   ? Authorization - Visit Number 4   ? Authorization - Number of Visits 12   ? Progress Note Due on Visit 10   ? SLP Start Time 1400   ? SLP Stop Time  1500   ? SLP Time Calculation (min) 60 min   ? Activity Tolerance Patient tolerated treatment well   ? ?  ?  ? ?  ? ? ?Past Medical History:  ?Diagnosis Date  ? Constipation   ? CVA (cerebral vascular accident) [facility])   ? Diarrhea   ? Dysarthria and anarthria   ? Hemiplegia and hemiparesis following cerebral infarction affecting right dominant side ([place])   ? Hyperlipidemia   ? Pain in right leg   ? ? ?No past surgical history on file. ? ?There were no vitals filed for this visit. ? ? Subjective Assessment - 05/16/21 0919   ? ? Subjective Pt was agitated with sister on the way over to therapy re: communication breakdown   ? Patient is accompained by: Family member   sister Jonathan Newman  ? Currently in Pain?  No/denies   ? ?  ?  ? ?  ? ? ? ? ? ? ? ? ADULT SLP TREATMENT - 05/16/21 0919   ? ?  ? General Information  ? Behavior/Cognition Alert;Cooperative;Decreased sustained attention   ? HPI Jonathan Newman is a 61 y.o. male referred by Dr. Manuella Ghazi for SLP evaluation. Hx noted for DVT, HTN Pt with reported CVA in February 2021 (records from OSH are unavailable), with subsequent rehabilitation in [place]. Pt now living with his sister in [place]. Repeat MRI brain on 09/29/20 showed "Large chronic left MCA territory and left MCA/PCA watershed territory cortical/subcortical infarct, as described. Superimposed chronic perforator infarct within the left centrum semiovale/corona radiata and left basal ganglia. Associated wallerian degeneration on the left." Chronic infarcts also within left thalamus and bilateral pons, and mild generalized cerebral and cerebellar atrophy.   ?  ? Cognitive-Linquistic Treatment  ? Treatment focused on Aphasia   ? Skilled Treatment Pt initiated conversation by pointing to "[employer]" on his device. Sister reports communication breakdown in the car; reports pt was vocalizing vs using his device to show what he wanted. Pt affirmed in session with his device that he wanted to go to [place]. Facilitated conversation by having pt answer [place] questions (When? and What?)- mod-max cues required initially. With SLP opening these sections, pt made selection "today" "clothing>shorts" to  indicate what he wanted to purchase. In session, when pt exhibited emotional changes via facial expression/affect, pt responded to "how are you feeling" questions 100% accuracy. Sister often asked pt "why?" and pt had difficulty responding to this with his device. Created "Why?" folder on pt's device to allow pt to respond with more complex information about feelings (because of my stroke, because I can't say what I want to, because I want to be more independent, because you don't understand me, because I miss mom, etc). Pt eagerly  interacted with new icons and utilized them independently in exchanges re: wanting an electric foldable wheelchair, wanting to go on vacation, and feeling sad.   ?  ? Assessment / Recommendations / Plan  ? Plan Continue with current plan of care   ?  ? Progression Toward Goals  ? Progression toward goals Progressing toward goals   ? ?  ?  ? ?  ? ? ? SLP Education - 05/16/21 0919   ? ? Education Details try to give "clues" if he can't find exact words on his device   ? Person(s) Educated Patient   ? Methods Explanation   ? Comprehension Verbalized understanding   ? ?  ?  ? ?  ? ? ? SLP Short Term Goals - 05/16/21 0921   ? ?  ? SLP SHORT TERM GOAL #1  ? Title Patient will comprehend simple Y/N questions >80% accuracy with written cues/ AAC support.   ? Baseline 80%   ? Time 5   ? Period Weeks   ? Status Achieved   ?  ? SLP SHORT TERM GOAL #2  ? Title Patient will indicate when he does not comprehend in >80% of trials (word to sentence level), using facial expression, gesture, or AAC.   ? Baseline 90%   ? Time 5   ? Period Weeks   ? Status Partially Met   ?  ? SLP SHORT TERM GOAL #3  ? Title Patient will answer simple biographical questions 80% accuracy (Y/N questions or AAC).   ? Baseline 80% with extended time, repetition, supportive conversation strategies   ? Time 5   ? Period Weeks   ? Status Partially Met   ?  ? SLP SHORT TERM GOAL #4  ? Title Per pt/caregiver report, patient will use AAC to make request related to physical needs or outings at least 5/7 days with min cues.   ? Baseline 05/01/21: sister reports pt making daily requests   ? Time 6   ? Period --   sessions  ? Status Achieved   ? Target Date --   by visit 2  ?  ? SLP SHORT TERM GOAL #5  ? Title Pt will use AAC to reduce frustration by relaying feelings or needs during a communication breakdown with min cues, per pt/family report.   ? Baseline 01/31/21 Patient has emotional outbursts  05/01/21 Patient no longer has emotional outbursts; uses facial  expressions, can use device to make common needs/feelings known but has difficulty with more abstract/less common needs 05/15/21: needs cues to use device outside of ST for this   ? Time 6   ? Period --   sessions  ? Status Partially Met   ? Target Date --   by visit 28  ? ?  ?  ? ?  ? ? ? SLP Long Term Goals - 05/16/21 0921   ? ?  ? SLP LONG TERM GOAL #1  ? Title Pt will  communicate emergency information 100% accuracy, using AAC if necessary.   ? Baseline 01/31/21 with moderate cues; progressing, will continue, 05/01/21 80% 05/15/21 90%   ? Time 12   ? Period Weeks   ? Status On-going   will renew all LTGs; goals not met as pt was unable to attend therapy since 1/17 due to scheduling  ? Target Date 07/30/21   ?  ? SLP LONG TERM GOAL #2  ? Title Patient will reduce frustration by communicating thoughts/feelings with AAC in 80% of opportunities.   ? Baseline 01/31/21 beginning to combine facial expressions with use of AAC icons; progressing, will continue 05/01/21 60% 05/15/21 80% with mod cues   ? Time 12   ? Period Weeks   ? Status On-going   ? Target Date 07/30/21   ?  ? SLP LONG TERM GOAL #3  ? Title Pt will successfully convey requests for outings/ personal needs outside of ST >75% of the time, using AAC if necessary.   ? Baseline 01/31/21 able to do so in therapy sessions; ongoing training necessary for carryover at home 05/01/21: In sessions 100%, at home 50%   ? Time 12   ? Period Weeks   ? Status On-going   ? Target Date 07/30/21   ?  ? SLP LONG TERM GOAL #4  ? Title Pt will reduce social isolation risk by participating in simple conversational exchange (3-5 minutes), using AAC.   ? Time 12   ? Period Weeks   ? Status Achieved   ?  ? SLP LONG TERM GOAL #5  ? Title Pt will answer [place] questions 80% of the time with min cues during a communication breakdown.   ? Time 12   ? Period Weeks   ? Status New   ? Target Date 07/30/21   ? ?  ?  ? ?  ? ? ? Plan - 05/16/21 0957   ? ? 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 Boby has made excellent progress using his device to communicate basic wants/needs as well as

## 2021-05-22 ENCOUNTER — Ambulatory Visit: Payer: Medicaid Other | Admitting: Speech Pathology

## 2021-05-22 ENCOUNTER — Ambulatory Visit: Payer: Medicaid Other | Admitting: Physical Therapy

## 2021-05-29 ENCOUNTER — Ambulatory Visit: Payer: Medicaid Other | Attending: Family Medicine | Admitting: Physical Therapy

## 2021-05-29 ENCOUNTER — Ambulatory Visit: Payer: Medicaid Other | Admitting: Speech Pathology

## 2021-05-29 DIAGNOSIS — R4701 Aphasia: Secondary | ICD-10-CM | POA: Insufficient documentation

## 2021-05-29 DIAGNOSIS — R2681 Unsteadiness on feet: Secondary | ICD-10-CM | POA: Diagnosis present

## 2021-05-29 DIAGNOSIS — R262 Difficulty in walking, not elsewhere classified: Secondary | ICD-10-CM | POA: Diagnosis present

## 2021-05-29 DIAGNOSIS — R482 Apraxia: Secondary | ICD-10-CM | POA: Insufficient documentation

## 2021-05-29 DIAGNOSIS — R2689 Other abnormalities of gait and mobility: Secondary | ICD-10-CM | POA: Insufficient documentation

## 2021-05-29 NOTE — Therapy (Signed)
[place] ?[facility] ?[address] ?[place], [place], [address] ?Phone: [phone]   Fax:  [phone] ? ?Physical Therapy Treatment ? ?Patient Details  ?Name: Jonathan Newman ?MRN: [id number] ?Date of Birth: Apr 14, 1960 ?No data recorded ? ?Encounter Date: 05/29/2021 ? ? PT End of Session - 05/29/21 1530   ? ? Visit Number 23   ? Number of Visits 25   ? Date for PT Re-Evaluation 07/17/21   ? Authorization Type Medicaid=Authorized combined PT/OT/SLP 27 visits   ? Authorization Time Period 9 authorized 10/31-12/31   ? Progress Note Due on Visit 30   ? PT Start Time 1517   ? PT Stop Time 1558   ? PT Time Calculation (min) 41 min   ? Equipment Utilized During Treatment Gait belt   ? Activity Tolerance Patient tolerated treatment well   ? Behavior During Therapy Impulsive;WFL for tasks assessed/performed   ? ?  ?  ? ?  ? ? ?Past Medical History:  ?Diagnosis Date  ? Constipation   ? CVA (cerebral vascular accident) [facility])   ? Diarrhea   ? Dysarthria and anarthria   ? Hemiplegia and hemiparesis following cerebral infarction affecting right dominant side (HCC)   ? Hyperlipidemia   ? Pain in right leg   ? ? ?History reviewed. No pertinent surgical history. ? ?There were no vitals filed for this visit. ? ? Subjective Assessment - 05/29/21 1528   ? ? Subjective Pt caregiver reports he had a fall last night when transfering off of toilet. Pt caregiver report no injury but had to use hoyer lift to get patient off of ground.  Patient would like to continue with walking progression and walking in parallel bars as he is able to session.   ? Patient is accompained by: Family member   Sister Jonathan Newman  ? Pertinent History Patient is a 61 year old male with recent history of L MCA stroke in Feb 2021.Per Dr. Sherryll Burger note on 09/18/2020-  Likely left Middle Cerebral Artery ischemic infract causing global aphasia affecting motor more than sensory and right flaccid hemiparesis and right hemisensory loss  in a patient with hypertension, smoking, etc. Had developed DVT afterwards. Patient is present with sister, Jonathan Newman today who reports he has been living with her since May 2022 and uses his electric w/c in the home an has not walked since CVA- has manual w/c that he uses for community outings. She reports he requires assist with all ADLs and is impulsive but does require assist with transfers for safety- but states he does go to bathroom on his own at times.   ? Limitations Lifting;Standing;Walking;Writing;Reading;House hold activities   ? How long can you sit comfortably? no issues or limits   ? How long can you stand comfortably? <   ? How long can you walk comfortably? Has not walked since CVA   ? Patient Stated Goals Caregiver goal- to be as independent and do as much as he can on his own.   ? ?  ?  ? ?  ? ? ? ? ?INTERVENTIONS: ?Therex: Nustep level 2/3 with R LE leg stabalizer in place to prevent ER of R LE.  ?x 3 min level 2 with 1 minute rest break.  ?X 3 minutes level 3  ? ?Gait training: in // bars:  ?Patient ambulated in // x 4 laps  -patient had to take rest breaks about halfway through parallel bars as well as performing increased independence this  session with only min assist from therapist for assistance with balance.  Patient still putting very little weight through right involved lower extremity but with cues patient able to take small steps this session.  Patient had right AFO donned(clinic AFO) and this did seem to improve his right lower extremity stability but he still unable to achieve adequate terminal knee extension for adequate weightbearing on the involved side.  We will continue to work on this in future sessions ? ?Static standing: in front of mirror -  ?-Lateral weight shifting over to right side and attempting to lock knee in extension. Mod A from PT to keep R knee locked in extension  ? ?Standing mini squat with mod A rom PT for weight shifting x 10  ? ?Pt required occasional rest  breaks due fatigue, PT was quick to ask when pt appeared to be fatiguing in order to prevent excessive fatigue. ?- pt had one incident where he stared off into space and had difficulty with focussing on questions caregiver is asking him. Caregiver reports this happens occasionally and only lasts a couple of minutes.  ? ?Education provided throughout session via VC/TC and demonstration to facilitate movement at target joints and correct muscle activation for all testing and exercises performed. ? ? ?Note: Portions of this document were prepared using Dragon voice recognition software and although reviewed may contain unintentional dictation errors in syntax, grammar, or spelling. ? ?Pt required occasional rest breaks due fatigue, PT was quick to ask when pt appeared to be fatiguing in order to prevent excessive fatigue. ? ? ? ? ? ? ? ? ? ? ? ? ? ? ? ? ? ? ? ? ? ? ? ? ? ? ? PT Education - 05/30/21 0815   ? ? Education Details Ankle-foot orthoses   ? Person(s) Educated Secretary/administratoratient;Caregiver(s)   ? Methods Explanation;Demonstration   ? Comprehension Verbalized understanding;Returned demonstration;Verbal cues required;Need further instruction   ? ?  ?  ? ?  ? ? ? PT Short Term Goals - 04/24/21 0815   ? ?  ? PT SHORT TERM GOAL #1  ? Title Pt will be independent with HEP in order to improve strength and balance in order to decrease fall risk and improve function at home and work.   ? Baseline 10/25/2020= Patient has no formal HEP in place12/8: Pt caregiver report sHEP has been going well.   ? Time 6   ? Period Weeks   ? Status Achieved   ? Target Date 12/06/20   ?  ? PT SHORT TERM GOAL #2  ? Title Patient will perform all sit to stand transfers with CGA for improved functional mobility and decreased dependence on caregivers.   ? Baseline 10/25/2020=Patient requires mod assist with all sit to stand transfers, 12/8: sister reports improved independence with most transfers, some guarding still necessary for safety, deomnstrates  ability to transfer to/from nuscetp and WC as well as to/from mat table.   ? Time 6   ? Period Weeks   ? Status Achieved   ? Target Date 12/06/20   ?  ? PT SHORT TERM GOAL #3  ? Title Patient perform 5 time sit to stand in 50 seconds or less in order to indicate improved lower extremity strength and stability.   ? Baseline 01/30/21 Able to complete 2 sit to stands but only able to utilize left lower extremity has difficulty with balance upon standing.  04/24/2021= 28 sec with left UE support   ? Time  12   ? Period Weeks   ? Status Achieved   ? Target Date 04/24/21   ? ?  ?  ? ?  ? ? ? ? PT Long Term Goals - 04/24/21 1535   ? ?  ? PT LONG TERM GOAL #1  ? Title Pt will improve FOTO to target score of 43  to display perceived improvements in ability to complete ADL's.   ? Baseline 10/25/2020= 29 12/8: 33.1 01/30/21: 36; 04/24/2021= Will reassess next visit   ? Time 12   ? Period Weeks   ? Status Revised   ? Target Date 07/17/21   ?  ? PT LONG TERM GOAL #2  ? Title Patient will perform stand pivot transfer with supervision from all surfaces without loss of balance or VC for safety.   ? Baseline 10/25/2020- Patient requires Mod assist for safe transfers with min ability to place any weight through right LE.01/30/21: able to perform without assistance but still requiries supervision as well as UE assistance on surfaces; 04/24/2021=Patient demo supervision with SPT today from wheelchair to mat and both he and his sister report he is doing this independently at home.   ? Time 12   ? Period Weeks   ? Status Achieved   ? Target Date 04/24/21   ?  ? PT LONG TERM GOAL #3  ? Title Patient will perform static stand with least restrictive assistive device > 2 min with > 25% weight bearing through right LE without LOB.   ? Baseline 12/8: able to stand for approximately 30 sec with R LE on airex pad and Min WB on the R LE and ModA from 2x PT for LE placement and to assist with weight shifting, 01/31/20: stands for 1:25 with CGA from PT for  balance and with min weight through R LE (foot is on floor); 04/24/2021= Patient was able to demonstrate 2 min 43 sec of static stand ensuring he was placing approx 25% weight bearing through his Right LE.   ?

## 2021-05-30 ENCOUNTER — Encounter: Payer: Self-pay | Admitting: Physical Therapy

## 2021-06-05 ENCOUNTER — Encounter: Payer: Self-pay | Admitting: Physical Therapy

## 2021-06-05 ENCOUNTER — Ambulatory Visit: Payer: Medicaid Other | Admitting: Physical Therapy

## 2021-06-05 ENCOUNTER — Ambulatory Visit: Payer: Medicaid Other | Admitting: Speech Pathology

## 2021-06-05 DIAGNOSIS — R482 Apraxia: Secondary | ICD-10-CM

## 2021-06-05 DIAGNOSIS — R262 Difficulty in walking, not elsewhere classified: Secondary | ICD-10-CM | POA: Diagnosis not present

## 2021-06-05 DIAGNOSIS — R4701 Aphasia: Secondary | ICD-10-CM

## 2021-06-05 DIAGNOSIS — R2681 Unsteadiness on feet: Secondary | ICD-10-CM

## 2021-06-05 DIAGNOSIS — R2689 Other abnormalities of gait and mobility: Secondary | ICD-10-CM

## 2021-06-05 NOTE — Therapy (Signed)
[place] [facility] ?[place], [place], [address] ?Phone: [phone]   Fax:  [phone] ? ?Speech Language Pathology Treatment ? ?Patient Details  ?Name: Jonathan Newman ?MRN: [id number] ?Date of Birth: Mar 11, 1960 ?Referring Provider (SLP): Dr. Jennings Books ? ? ?Encounter Date: 06/05/2021 ? ? End of Session - 06/05/21 1730   ? ? Visit Number 21   ? Number of Visits 27   ? Date for SLP Re-Evaluation 07/09/21   ? Authorization Type Medicaid - 27 visits combined for SLP/PT; 12 ST visits auth 02/07/21-05/01/21   ? Authorization Time Period Visits used in 2023 (PT/ST comb: 10 on 04/10/21)   ? Authorization - Visit Number 5   ? Authorization - Number of Visits 12   ? Progress Note Due on Visit 10   ? SLP Start Time 1400   ? SLP Stop Time  1500   ? SLP Time Calculation (min) 60 min   ? Activity Tolerance Patient tolerated treatment well   ? ?  ?  ? ?  ? ? ?Past Medical History:  ?Diagnosis Date  ? Constipation   ? CVA (cerebral vascular accident) [facility])   ? Diarrhea   ? Dysarthria and anarthria   ? Hemiplegia and hemiparesis following cerebral infarction affecting right dominant side ([place])   ? Hyperlipidemia   ? Pain in right leg   ? ? ?No past surgical history on file. ? ?There were no vitals filed for this visit. ? ? Subjective Assessment - 06/05/21 1727   ? ? Subjective "Way way way" (stereotypic utterance)   ? Patient is accompained by: Family member   sister Jonathan Newman  ? Currently in Pain? Yes   ? Pain Score 5    ? Pain Location Leg   ? Pain Orientation Right   ? ?  ?  ? ?  ? ? ? ? ? ? ? ? ADULT SLP TREATMENT - 06/05/21 1729   ? ?  ? General Information  ? Behavior/Cognition Alert;Cooperative;Decreased sustained attention   ? HPI Jonathan Newman is a 61 y.o. male referred by Dr. Manuella Ghazi for SLP evaluation. Hx noted for DVT, HTN Pt with reported CVA in February 2021 (records from OSH are unavailable), with subsequent rehabilitation in [place]. Pt now living with his sister  in [place]. Repeat MRI brain on 09/29/20 showed "Large chronic left MCA territory and left MCA/PCA watershed territory cortical/subcortical infarct, as described. Superimposed chronic perforator infarct within the left centrum semiovale/corona radiata and left basal ganglia. Associated wallerian degeneration on the left." Chronic infarcts also within left thalamus and bilateral pons, and mild generalized cerebral and cerebellar atrophy.   ?  ? Cognitive-Linquistic Treatment  ? Treatment focused on Aphasia   ? Skilled Treatment Pt?s sister reports pt appetite has been poor. SLP acknowledged pt?s facial expression and accessed ?feelings? section on pt?s device; pt selected he was feeling ?sad?Irena Reichmann ?why?  menu with min gestural cue; pt selected ?because of my stroke,? ?because you don?t understand me?, and ?because I want to walk.?  SLP affirmed pt?s feelings and thanked him for sharing these. Provided encouragement and support.  Pt initiated request from [employer]; could not locate the item he was looking for but used ?[employer]? ; pt confirmed that he was requesting steak. With min question cues, but used ?When? section to elaborate (July) and provide additional information (for his birthday) and ?who? section to indicate who he would like to celebrate with. Pt selected ?I  want to go on vacation.? Using a map, selected [facility]. SLP added content to ?I want? section for pt to make requests re: his birthday plans. Near end of session, pt rubbing his right leg. He accessed pain menu and ?severe pain,? however required further questioning to establish location of pain (visual and gestural), and rated as 5/10 with numerical scale on his device. Encouraged patient to share this with his PT.   ?  ? Assessment / Recommendations / Plan  ? Plan Continue with current plan of care   ?  ? Progression Toward Goals  ? Progression toward goals Progressing toward goals   ? ?  ?  ? ?  ? ? ? SLP Education - 06/05/21  1730   ? ? Education Details how to use device to give context about topic change   ? Person(s) Educated Patient   ? Methods Explanation;Demonstration;Verbal cues   ? Comprehension Verbalized understanding;Verbal cues required;Need further instruction   ? ?  ?  ? ?  ? ? ? SLP Short Term Goals - 05/16/21 0921   ? ?  ? SLP SHORT TERM GOAL #1  ? Title Patient will comprehend simple Y/N questions >80% accuracy with written cues/ AAC support.   ? Baseline 80%   ? Time 5   ? Period Weeks   ? Status Achieved   ?  ? SLP SHORT TERM GOAL #2  ? Title Patient will indicate when he does not comprehend in >80% of trials (word to sentence level), using facial expression, gesture, or AAC.   ? Baseline 90%   ? Time 5   ? Period Weeks   ? Status Partially Met   ?  ? SLP SHORT TERM GOAL #3  ? Title Patient will answer simple biographical questions 80% accuracy (Y/N questions or AAC).   ? Baseline 80% with extended time, repetition, supportive conversation strategies   ? Time 5   ? Period Weeks   ? Status Partially Met   ?  ? SLP SHORT TERM GOAL #4  ? Title Per pt/caregiver report, patient will use AAC to make request related to physical needs or outings at least 5/7 days with min cues.   ? Baseline 05/01/21: sister reports pt making daily requests   ? Time 6   ? Period --   sessions  ? Status Achieved   ? Target Date --   by visit 3  ?  ? SLP SHORT TERM GOAL #5  ? Title Pt will use AAC to reduce frustration by relaying feelings or needs during a communication breakdown with min cues, per pt/family report.   ? Baseline 01/31/21 Patient has emotional outbursts  05/01/21 Patient no longer has emotional outbursts; uses facial expressions, can use device to make common needs/feelings known but has difficulty with more abstract/less common needs 05/15/21: needs cues to use device outside of ST for this   ? Time 6   ? Period --   sessions  ? Status Partially Met   ? Target Date --   by visit 69  ? ?  ?  ? ?  ? ? ? SLP Long Term Goals - 05/16/21  0921   ? ?  ? SLP LONG TERM GOAL #1  ? Title Pt will communicate emergency information 100% accuracy, using AAC if necessary.   ? Baseline 01/31/21 with moderate cues; progressing, will continue, 05/01/21 80% 05/15/21 90%   ? Time 12   ? Period Weeks   ? Status  On-going   will renew all LTGs; goals not met as pt was unable to attend therapy since 1/17 due to scheduling  ? Target Date 07/30/21   ?  ? SLP LONG TERM GOAL #2  ? Title Patient will reduce frustration by communicating thoughts/feelings with AAC in 80% of opportunities.   ? Baseline 01/31/21 beginning to combine facial expressions with use of AAC icons; progressing, will continue 05/01/21 60% 05/15/21 80% with mod cues   ? Time 12   ? Period Weeks   ? Status On-going   ? Target Date 07/30/21   ?  ? SLP LONG TERM GOAL #3  ? Title Pt will successfully convey requests for outings/ personal needs outside of ST >75% of the time, using AAC if necessary.   ? Baseline 01/31/21 able to do so in therapy sessions; ongoing training necessary for carryover at home 05/01/21: In sessions 100%, at home 50%   ? Time 12   ? Period Weeks   ? Status On-going   ? Target Date 07/30/21   ?  ? SLP LONG TERM GOAL #4  ? Title Pt will reduce social isolation risk by participating in simple conversational exchange (3-5 minutes), using AAC.   ? Time 12   ? Period Weeks   ? Status Achieved   ?  ? SLP LONG TERM GOAL #5  ? Title Pt will answer [place] questions 80% of the time with min cues during a communication breakdown.   ? Time 12   ? Period Weeks   ? Status New   ? Target Date 07/30/21   ? ?  ?  ? ?  ? ? ? Plan - 06/05/21 1742   ? ? 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 Darey has made excellent progress using his device to communicate basic wants/needs as well as to initiate communication exchanges, and share feelings in structured setting. Pt improving use of his device to  answer [facility] questions in simulated communication breakdowns, as well as in actual breakdown in session today. He is progressing toward LTGs, anticipate he will meet goals by 07/30/21. I recommend continu

## 2021-06-05 NOTE — Therapy (Signed)
[place] ?[facility] ?[address] ?[place], [place], [address] ?Phone: [phone]   Fax:  [phone] ? ?Physical Therapy Treatment ? ?Patient Details  ?Name: Jonathan Newman ?MRN: [id number] ?Date of Birth: [birth year] ?No data recorded ? ?Encounter Date: 06/05/2021 ? ? PT End of Session - 06/05/21 1622   ? ? Visit Number 24   ? Number of Visits 25   ? Date for PT Re-Evaluation 07/17/21   ? Authorization Type Medicaid=Authorized combined PT/OT/SLP 27 visits   ? Authorization Time Period 9 authorized 10/31-12/31   ? Progress Note Due on Visit 30   ? PT Start Time 1515   ? PT Stop Time 1600   ? PT Time Calculation (min) 45 min   ? Equipment Utilized During Treatment Gait belt   ? Activity Tolerance Patient tolerated treatment well   ? Behavior During Therapy Impulsive;WFL for tasks assessed/performed   ? ?  ?  ? ?  ? ? ?Past Medical History:  ?Diagnosis Date  ? Constipation   ? CVA (cerebral vascular accident) [facility])   ? Diarrhea   ? Dysarthria and anarthria   ? Hemiplegia and hemiparesis following cerebral infarction affecting right dominant side (HCC)   ? Hyperlipidemia   ? Pain in right leg   ? ? ?History reviewed. No pertinent surgical history. ? ?There were no vitals filed for this visit. ? ? Subjective Assessment - 06/05/21 1621   ? ? Subjective Patient reports he is a little sad today because of his inability to walk as well as due to his chronic issues regarding his stroke.  Patient provided with several jokes and drug book which did improve patient's mood patient was generally less happy throughout physical therapy session.  Patient sister and reports he has also been eating less at home which is concerning for her.   ? Patient is accompained by: Family member   Sister [name]  ? Pertinent History Patient is a 61 year old male with recent history of L MCA stroke in Feb 2021.Per Dr. Sherryll Burger note on 09/18/2020-  Likely left Middle Cerebral Artery ischemic infract causing  global aphasia affecting motor more than sensory and right flaccid hemiparesis and right hemisensory loss in a patient with hypertension, smoking, etc. Had developed DVT afterwards. Patient is present with sister, [name] today who reports he has been living with her since May 2022 and uses his electric w/c in the home an has not walked since CVA- has manual w/c that he uses for community outings. She reports he requires assist with all ADLs and is impulsive but does require assist with transfers for safety- but states he does go to bathroom on his own at times.   ? Limitations Lifting;Standing;Walking;Writing;Reading;House hold activities   ? How long can you sit comfortably? no issues or limits   ? How long can you stand comfortably? <   ? How long can you walk comfortably? Has not walked since CVA   ? Patient Stated Goals Caregiver goal- to be as independent and do as much as he can on his own.   ? ?  ?  ? ?  ? ? ? ? ?INTERVENTIONS: ?Therex: Nustep level 2/3 with R LE leg stabalizer in place to prevent ER of R LE.  ?x 3 min level 2 with 1 minute rest break.  ?X 3 minutes level 3  ? ?Gait training: in // bars:  ?Patient ambulated in // x 8 laps  -on all but  last 2 laps patient had to take a rest break halfway through ambulatory bout . Patient had right AFO donned(clinic AFO) and this did seem to improve his right lower extremity stability but he still unable to achieve adequate terminal knee extension for adequate weightbearing on the involved side.  Patient demonstrated improved weightbearing on his right lower extremity as physical therapist manually assisted me to maintain and obtain extension during right lower extremity weightbearing.  Progressively throughout ambulation training patient was encouraged to take step and allow for right lower extremity weightbearing before stepping.  This was effective at improving right lower extremity weightbearing capacity.  We will continue to work on this in future  sessions ? ?2 rounds of 5 with a weight shifting with physical therapist assistant and anteriorly at obtaining right lower extremity knee extension for improved stability with ambulation.  Patient does report increased fatigue on the right lower extremity with this activity and requires rest break after 5 repetitions ? ?Pt required occasional rest breaks due fatigue, PT was quick to ask when pt appeared to be fatiguing in order to prevent excessive fatigue. ?Note: Portions of this document were prepared using Dragon voice recognition software and although reviewed may contain unintentional dictation errors in syntax, grammar, or spelling. ?Pt educated throughout session about proper posture and technique with exercises. Improved exercise technique, movement at target joints, use of target muscles after min to mod verbal, visual, tactile cues. ? ? ? ? ? ? ? ? ? ? ? ? ? ? ? ? ? ? ? ? ? ? ? ? ? ? ? PT Education - 06/05/21 1622   ? ? Education Details Ankle-foot orthoses   ? Person(s) Educated Patient   ? Methods Explanation   ? Comprehension Verbalized understanding   ? ?  ?  ? ?  ? ? ? PT Short Term Goals - 04/24/21 0815   ? ?  ? PT SHORT TERM GOAL #1  ? Title Pt will be independent with HEP in order to improve strength and balance in order to decrease fall risk and improve function at home and work.   ? Baseline 10/25/2020= Patient has no formal HEP in place12/8: Pt caregiver report sHEP has been going well.   ? Time 6   ? Period Weeks   ? Status Achieved   ? Target Date 12/06/20   ?  ? PT SHORT TERM GOAL #2  ? Title Patient will perform all sit to stand transfers with CGA for improved functional mobility and decreased dependence on caregivers.   ? Baseline 10/25/2020=Patient requires mod assist with all sit to stand transfers, 12/8: sister reports improved independence with most transfers, some guarding still necessary for safety, deomnstrates ability to transfer to/from nuscetp and WC as well as to/from mat table.    ? Time 6   ? Period Weeks   ? Status Achieved   ? Target Date 12/06/20   ?  ? PT SHORT TERM GOAL #3  ? Title Patient perform 5 time sit to stand in 50 seconds or less in order to indicate improved lower extremity strength and stability.   ? Baseline 01/30/21 Able to complete 2 sit to stands but only able to utilize left lower extremity has difficulty with balance upon standing.  04/24/2021= 28 sec with left UE support   ? Time 12   ? Period Weeks   ? Status Achieved   ? Target Date 04/24/21   ? ?  ?  ? ?  ? ? ? ?  PT Long Term Goals - 04/24/21 1535   ? ?  ? PT LONG TERM GOAL #1  ? Title Pt will improve FOTO to target score of 43  to display perceived improvements in ability to complete ADL's.   ? Baseline 10/25/2020= 29 12/8: 33.1 01/30/21: 36; 04/24/2021= Will reassess next visit   ? Time 12   ? Period Weeks   ? Status Revised   ? Target Date 07/17/21   ?  ? PT LONG TERM GOAL #2  ? Title Patient will perform stand pivot transfer with supervision from all surfaces without loss of balance or VC for safety.   ? Baseline 10/25/2020- Patient requires Mod assist for safe transfers with min ability to place any weight through right LE.01/30/21: able to perform without assistance but still requiries supervision as well as UE assistance on surfaces; 04/24/2021=Patient demo supervision with SPT today from wheelchair to mat and both he and his sister report he is doing this independently at home.   ? Time 12   ? Period Weeks   ? Status Achieved   ? Target Date 04/24/21   ?  ? PT LONG TERM GOAL #3  ? Title Patient will perform static stand with least restrictive assistive device > 2 min with > 25% weight bearing through right LE without LOB.   ? Baseline 12/8: able to stand for approximately 30 sec with R LE on airex pad and Min WB on the R LE and ModA from 2x PT for LE placement and to assist with weight shifting, 01/31/20: stands for 1:25 with CGA from PT for balance and with min weight through R LE (foot is on floor); 04/24/2021= Patient  was able to demonstrate 2 min 43 sec of static stand ensuring he was placing approx 25% weight bearing through his Right LE.   ? Time 12   ? Period Weeks   ? Status Achieved   ? Target Date 03/22/21   ?

## 2021-06-12 ENCOUNTER — Ambulatory Visit: Payer: Medicaid Other | Admitting: Physical Therapy

## 2021-06-12 ENCOUNTER — Inpatient Hospital Stay
Admission: EM | Admit: 2021-06-12 | Discharge: 2021-06-14 | DRG: 101 | Disposition: A | Discharge status: Home / Self Care | Payer: Medicaid Other | Attending: Internal Medicine | Admitting: Internal Medicine

## 2021-06-12 ENCOUNTER — Other Ambulatory Visit: Payer: Self-pay

## 2021-06-12 ENCOUNTER — Emergency Department: Payer: Medicaid Other

## 2021-06-12 ENCOUNTER — Ambulatory Visit: Payer: Medicaid Other | Admitting: Speech Pathology

## 2021-06-12 ENCOUNTER — Encounter: Payer: Self-pay | Admitting: Radiology

## 2021-06-12 DIAGNOSIS — Z7901 Long term (current) use of anticoagulants: Secondary | ICD-10-CM | POA: Diagnosis not present

## 2021-06-12 DIAGNOSIS — Z87891 Personal history of nicotine dependence: Secondary | ICD-10-CM | POA: Diagnosis not present

## 2021-06-12 DIAGNOSIS — F32A Depression, unspecified: Secondary | ICD-10-CM | POA: Diagnosis present

## 2021-06-12 DIAGNOSIS — Z79899 Other long term (current) drug therapy: Secondary | ICD-10-CM

## 2021-06-12 DIAGNOSIS — Z95818 Presence of other cardiac implants and grafts: Secondary | ICD-10-CM | POA: Diagnosis present

## 2021-06-12 DIAGNOSIS — Z7401 Bed confinement status: Secondary | ICD-10-CM

## 2021-06-12 DIAGNOSIS — R569 Unspecified convulsions: Principal | ICD-10-CM

## 2021-06-12 DIAGNOSIS — Z8673 Personal history of transient ischemic attack (TIA), and cerebral infarction without residual deficits: Secondary | ICD-10-CM

## 2021-06-12 DIAGNOSIS — Z86718 Personal history of other venous thrombosis and embolism: Secondary | ICD-10-CM

## 2021-06-12 DIAGNOSIS — G40909 Epilepsy, unspecified, not intractable, without status epilepticus: Secondary | ICD-10-CM | POA: Diagnosis present

## 2021-06-12 DIAGNOSIS — E785 Hyperlipidemia, unspecified: Secondary | ICD-10-CM

## 2021-06-12 DIAGNOSIS — I69351 Hemiplegia and hemiparesis following cerebral infarction affecting right dominant side: Secondary | ICD-10-CM | POA: Diagnosis not present

## 2021-06-12 DIAGNOSIS — E876 Hypokalemia: Secondary | ICD-10-CM | POA: Diagnosis present

## 2021-06-12 DIAGNOSIS — Z993 Dependence on wheelchair: Secondary | ICD-10-CM | POA: Diagnosis not present

## 2021-06-12 DIAGNOSIS — N179 Acute kidney failure, unspecified: Secondary | ICD-10-CM | POA: Diagnosis present

## 2021-06-12 LAB — CBC WITH DIFFERENTIAL/PLATELET
Abs Immature Granulocytes: 0.03 10*3/uL (ref 0.00–0.07)
Basophils Absolute: 0.1 10*3/uL (ref 0.0–0.1)
Basophils Relative: 1 %
Eosinophils Absolute: 0.1 10*3/uL (ref 0.0–0.5)
Eosinophils Relative: 1 %
HCT: 39.1 % (ref 39.0–52.0)
Hemoglobin: 13.1 g/dL (ref 13.0–17.0)
Immature Granulocytes: 1 %
Lymphocytes Relative: 37 %
Lymphs Abs: 2.1 10*3/uL (ref 0.7–4.0)
MCH: 32.8 pg (ref 26.0–34.0)
MCHC: 33.5 g/dL (ref 30.0–36.0)
MCV: 98 fL (ref 80.0–100.0)
Monocytes Absolute: 0.5 10*3/uL (ref 0.1–1.0)
Monocytes Relative: 9 %
Neutro Abs: 2.9 10*3/uL (ref 1.7–7.7)
Neutrophils Relative %: 51 %
Platelets: 244 10*3/uL (ref 150–400)
RBC: 3.99 MIL/uL — ABNORMAL LOW (ref 4.22–5.81)
RDW: 13.7 % (ref 11.5–15.5)
WBC: 5.7 10*3/uL (ref 4.0–10.5)
nRBC: 0 % (ref 0.0–0.2)

## 2021-06-12 LAB — COMPREHENSIVE METABOLIC PANEL
ALT: 34 U/L (ref 0–44)
AST: 29 U/L (ref 15–41)
Albumin: 3.7 g/dL (ref 3.5–5.0)
Alkaline Phosphatase: 52 U/L (ref 38–126)
Anion gap: 8 (ref 5–15)
BUN: 45 mg/dL — ABNORMAL HIGH (ref 6–20)
CO2: 30 mmol/L (ref 22–32)
Calcium: 8.3 mg/dL — ABNORMAL LOW (ref 8.9–10.3)
Chloride: 99 mmol/L (ref 98–111)
Creatinine, Ser: 1.57 mg/dL — ABNORMAL HIGH (ref 0.61–1.24)
GFR, Estimated: 50 mL/min — ABNORMAL LOW (ref >60)
Glucose, Bld: 94 mg/dL (ref 70–99)
Potassium: 3.3 mmol/L — ABNORMAL LOW (ref 3.5–5.1)
Sodium: 137 mmol/L (ref 135–145)
Total Bilirubin: 0.7 mg/dL (ref 0.3–1.2)
Total Protein: 6.5 g/dL (ref 6.5–8.1)

## 2021-06-12 LAB — LIPASE, BLOOD: Lipase: 30 U/L (ref 11–51)

## 2021-06-12 IMAGING — CT CT HEAD W/O CM
4 series · 16 of 47 positions shown, 18 images · non-contrast
Comparison: [DATE]

CLINICAL DATA: Seizure



[Series 2: head wo · axial · 0.41mm/px · z∈[-135,-20]mm · 7 of 31 slices shown, 9 images]
[im 4/31  brain]
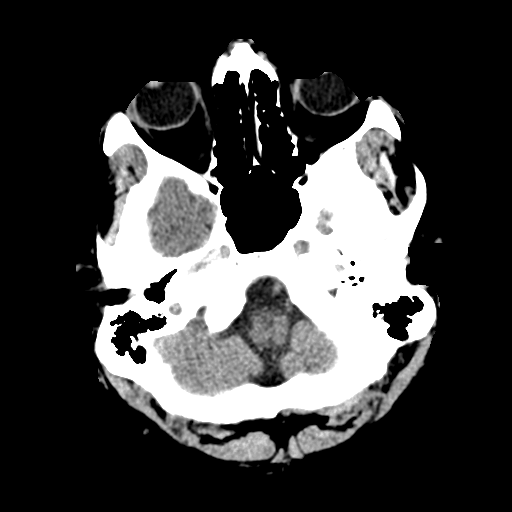
[im 4/31  bone]
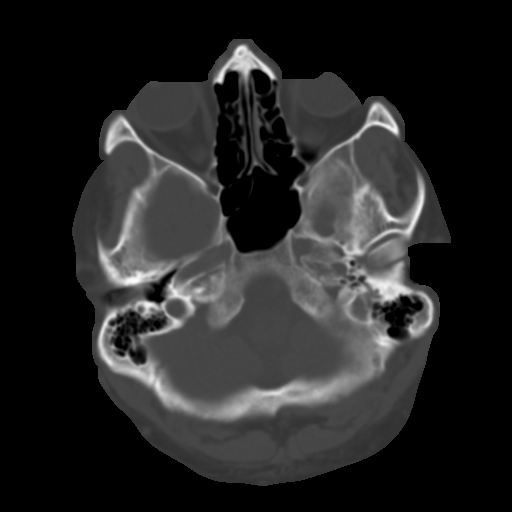
[im 8/31  brain]
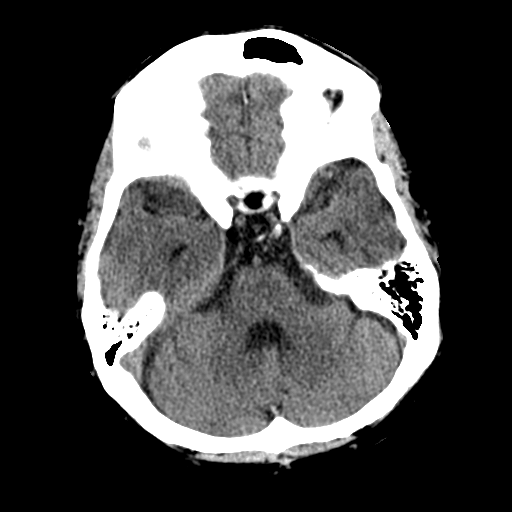
[im 12/31  brain]
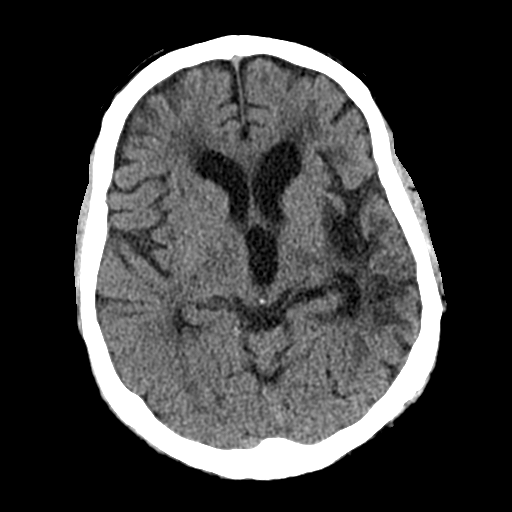
[im 16/31  brain]
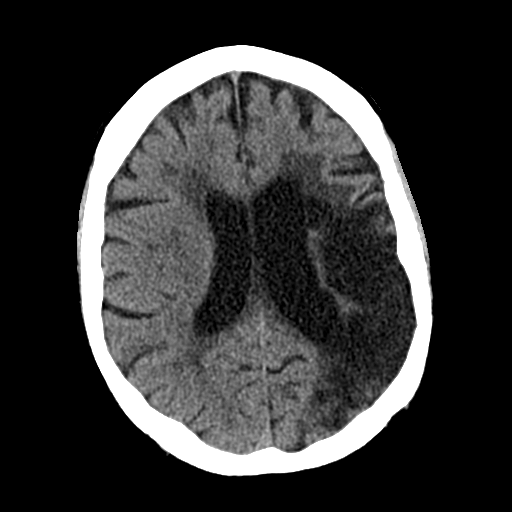
[im 19/31  brain]
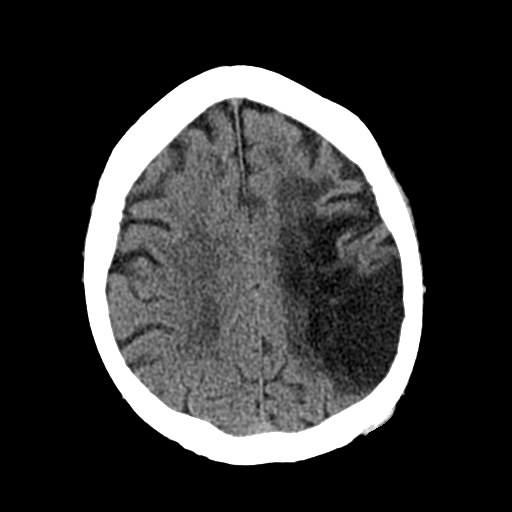
[im 19/31  bone]
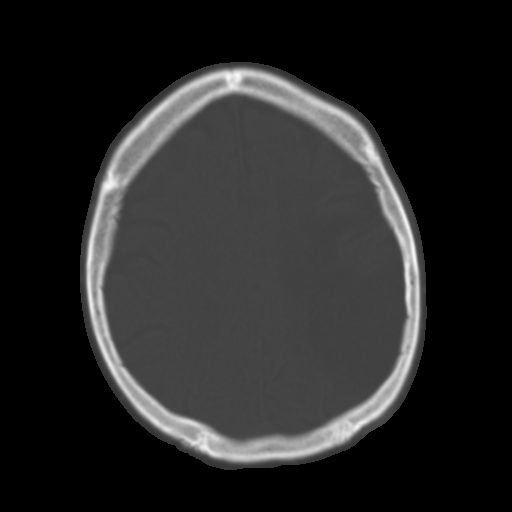
[im 23/31  brain]
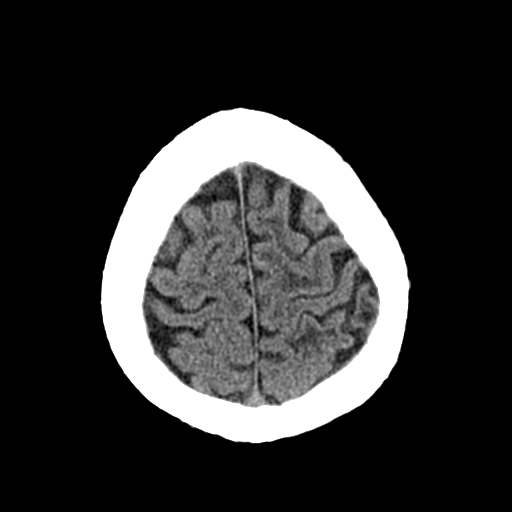
[im 27/31  brain]
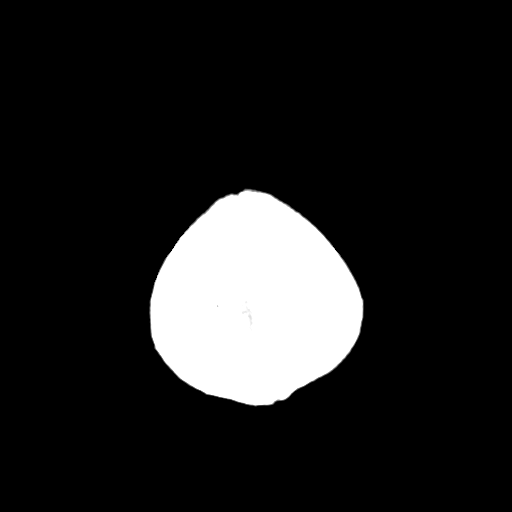

[Series 3: head bone · axial · 0.41mm/px · z∈[-136,-106]mm · 3 of 77 slices shown]
[im 8/77  bone]
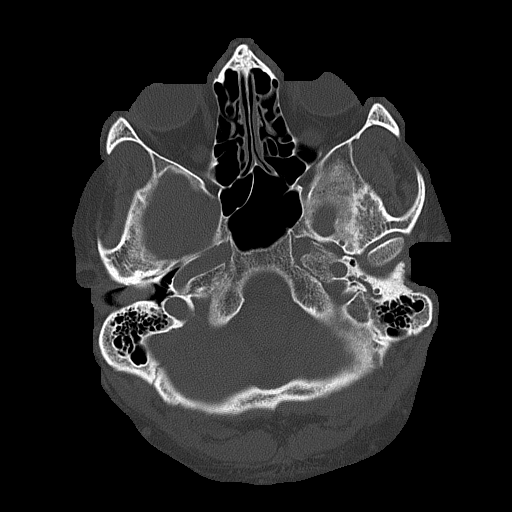
[im 16/77  bone]
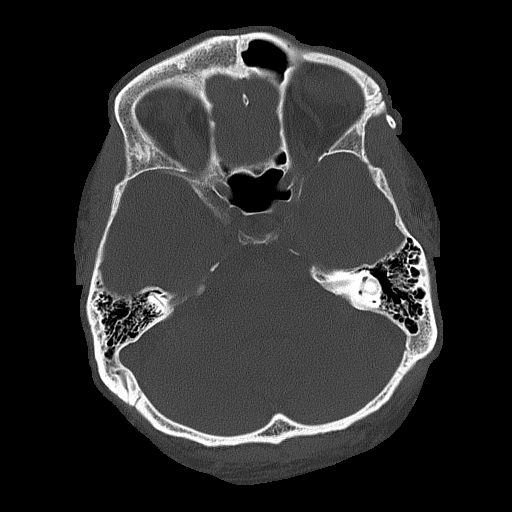
[im 23/77  bone]
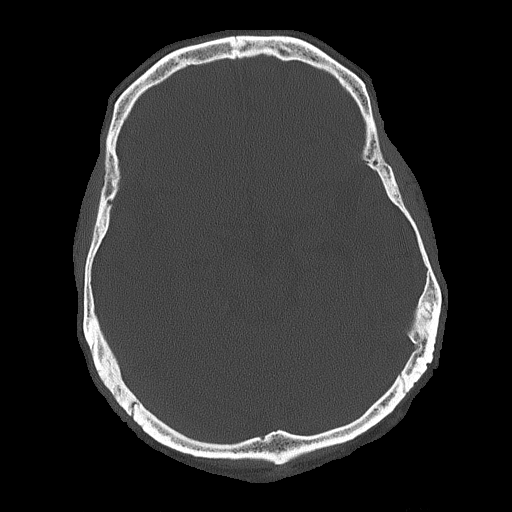

[Series 4: coronal soft tissue · coronal · 0.32mm/px · 3 of 64 slices shown]
[im 22/64  brain]
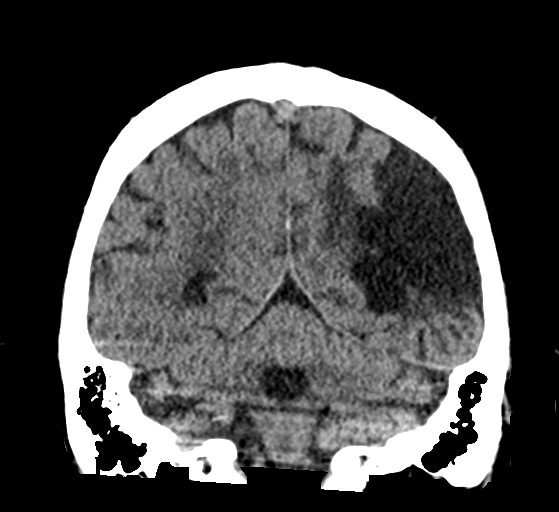
[im 29/64  brain]
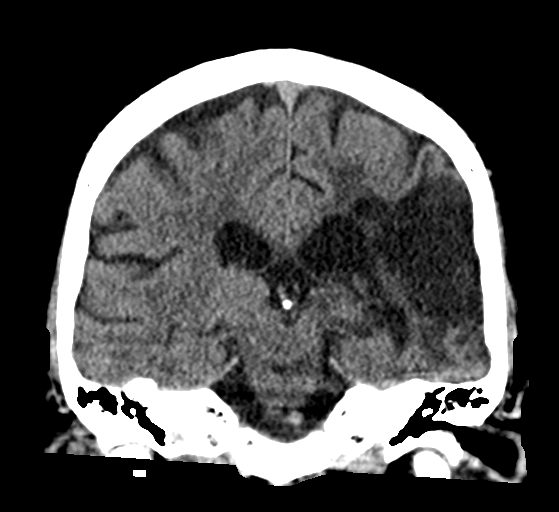
[im 36/64  brain]
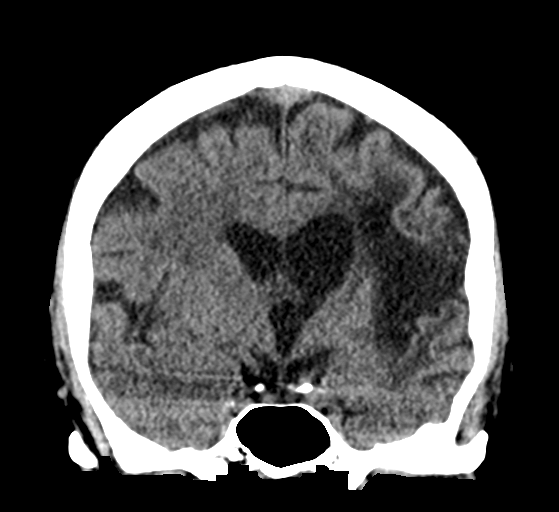

[Series 5: sagittal soft tissue · sagittal · 0.32mm/px · 3 of 58 slices shown]
[im 20/58  brain]
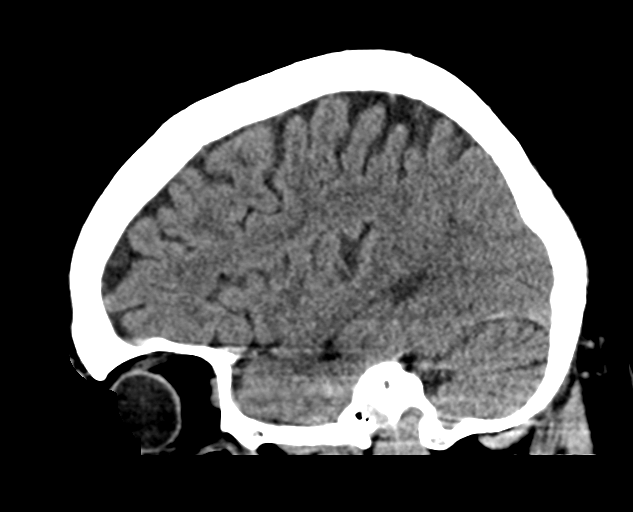
[im 29/58  brain]
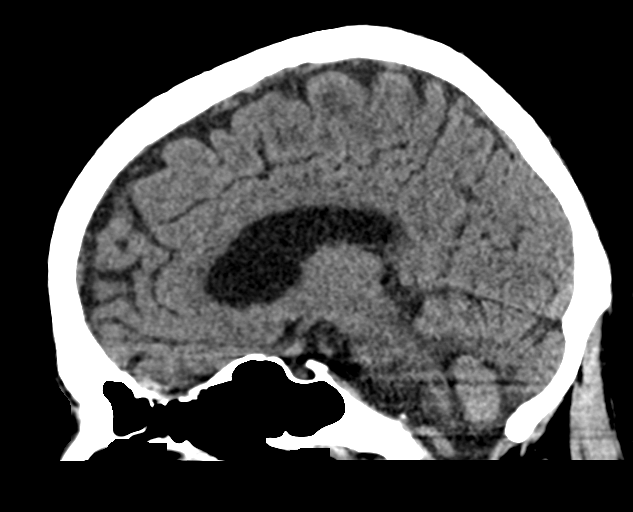
[im 39/58  brain]
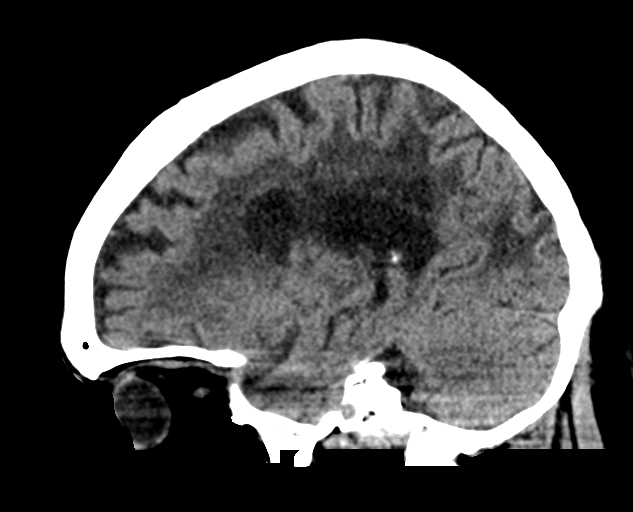

[16 of 47 positions shown; findings below may reference images not displayed]

FINDINGS: Brain: No evidence of acute infarction, hemorrhage, hydrocephalus,
extra-axial collection or mass lesion/mass effect.

Encephalomalacic changes related to old left MCA distribution
infarct. Old left basal ganglia lacunar infarct. Subcortical white
matter and periventricular small vessel ischemic changes.

Vascular: No hyperdense vessel or unexpected calcification.

Skull: Normal. Negative for fracture or focal lesion.

Sinuses/Orbits: The visualized paranasal sinuses are essentially
clear. The mastoid air cells are unopacified.

Other: None.
IMPRESSION: No acute intracranial abnormality.

Old left MCA distribution infarct. Old left basal ganglia lacunar
infarct. Small vessel ischemic changes.

## 2021-06-12 IMAGING — CT CT ABD-PELV W/ CM
2 of 5 series · 16 of 46 positions shown, 18 images · IV contrast (APPLIED)
Comparison: [DATE]

CLINICAL DATA: Nausea and vomiting. Recent hospitalization for
epilepsy. Bradycardia.

EXAM:
CT ABDOMEN AND PELVIS WITH CONTRAST
TECHNIQUE: Multidetector CT imaging of the abdomen and pelvis was performed
using the standard protocol following bolus administration of
intravenous contrast.

[Series 2: routine abd/pel with · axial · 0.98mm/px · z∈[-482,-2]mm · 13 of 109 slices shown, 15 images]
[im 7/109  soft-tissue]
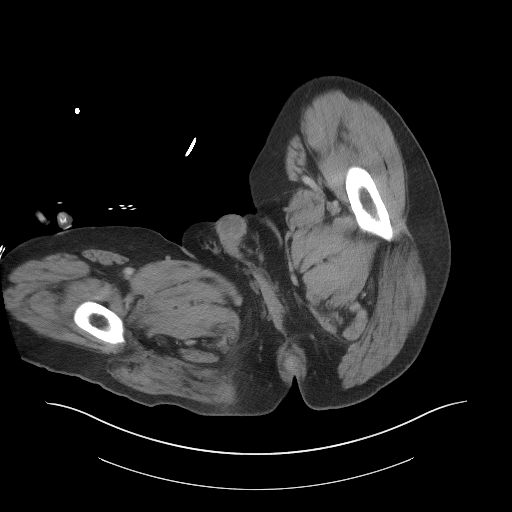
[im 7/109  bone]
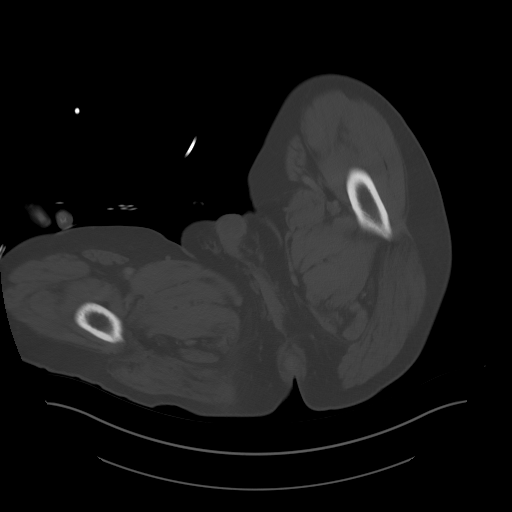
[im 13/109  soft-tissue]
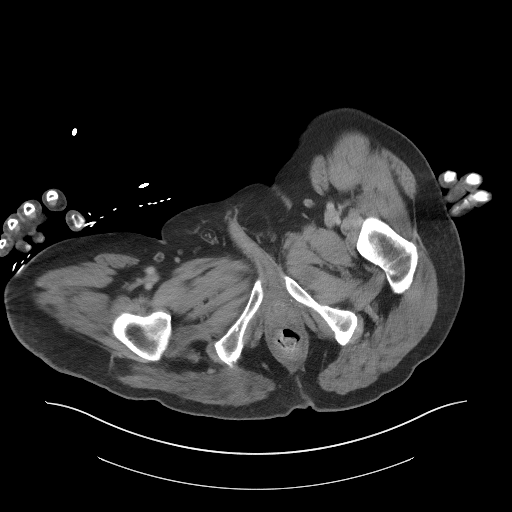
[im 25/109  soft-tissue]
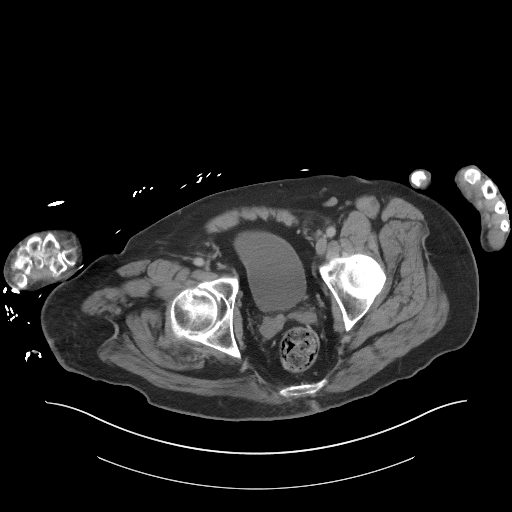
[im 31/109  soft-tissue]
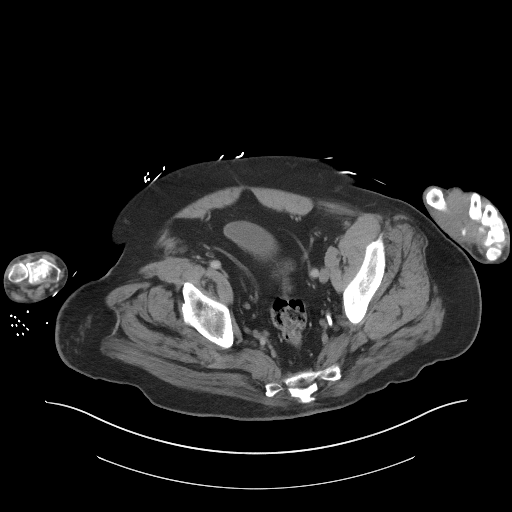
[im 37/109  soft-tissue]
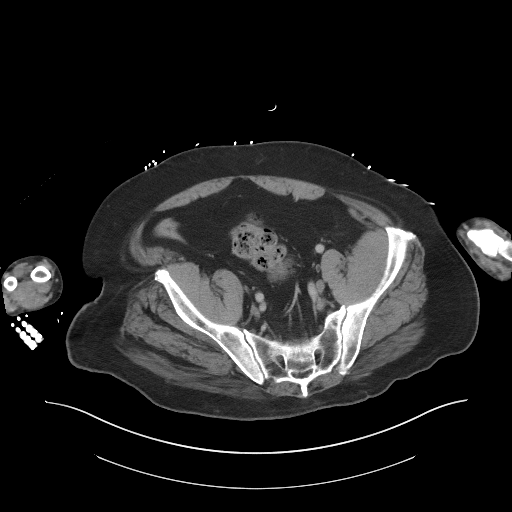
[im 49/109  soft-tissue]
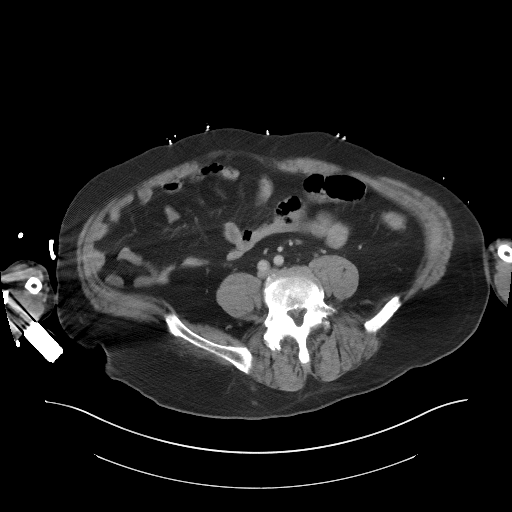
[im 55/109  soft-tissue]
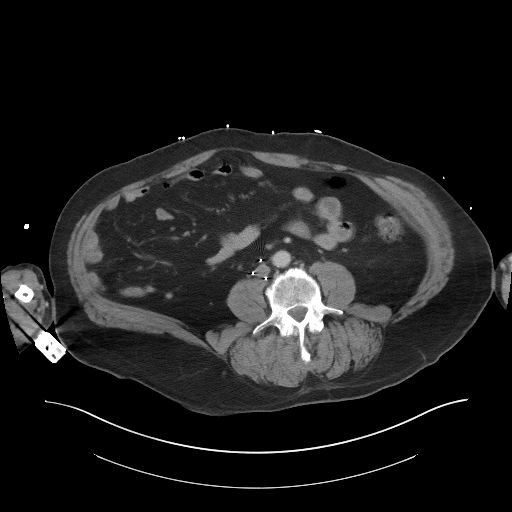
[im 61/109  soft-tissue]
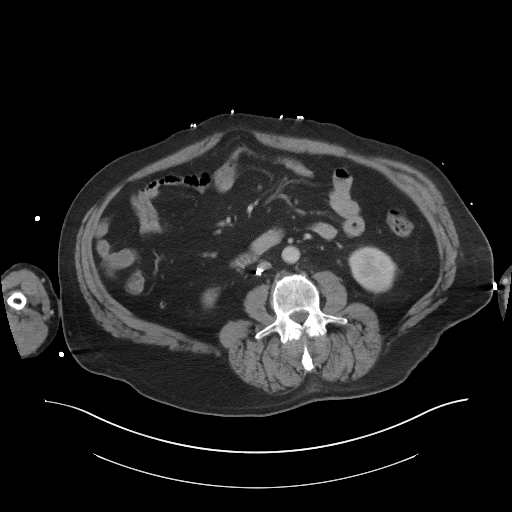
[im 73/109  soft-tissue]
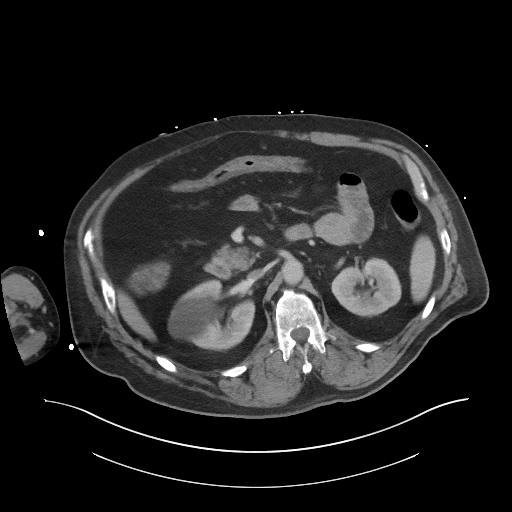
[im 73/109  bone]
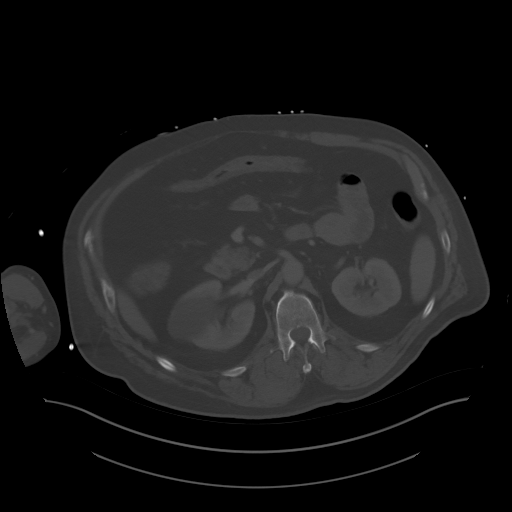
[im 79/109  soft-tissue]
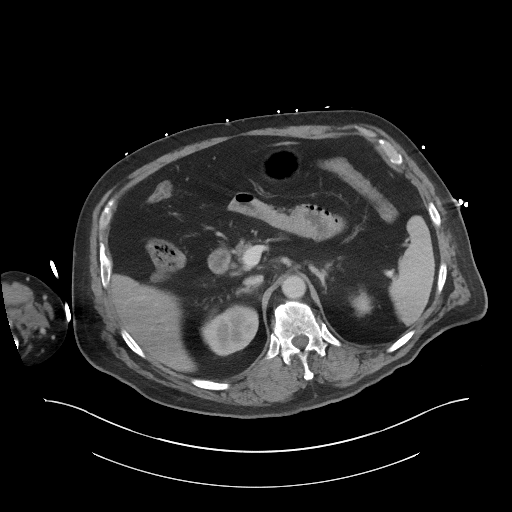
[im 85/109  soft-tissue]
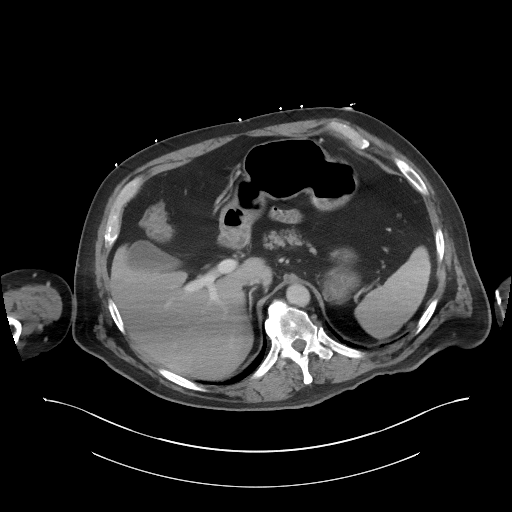
[im 97/109  soft-tissue]
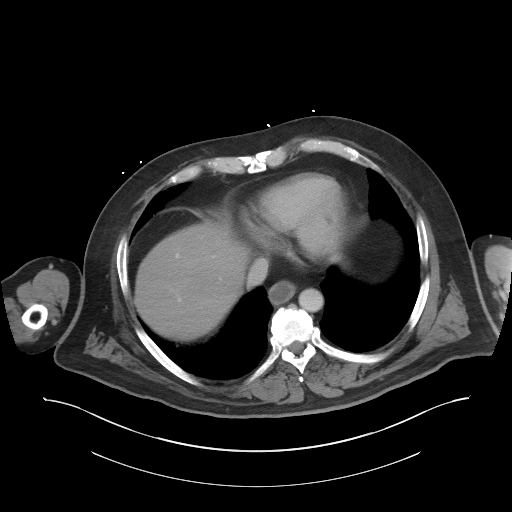
[im 103/109  soft-tissue]
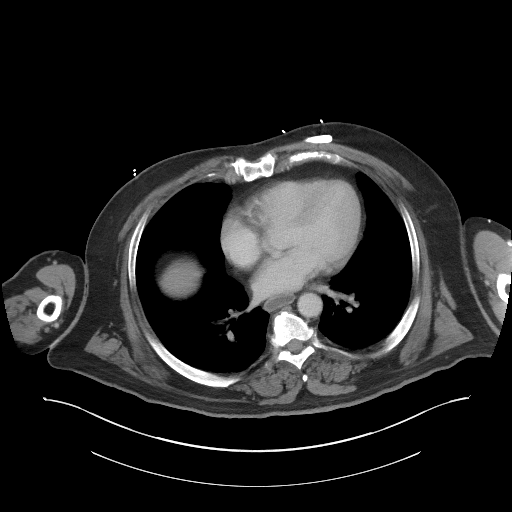

[Series 5: coronal st · coronal · 0.84mm/px · 3 of 100 slices shown]
[im 34/100  soft-tissue]
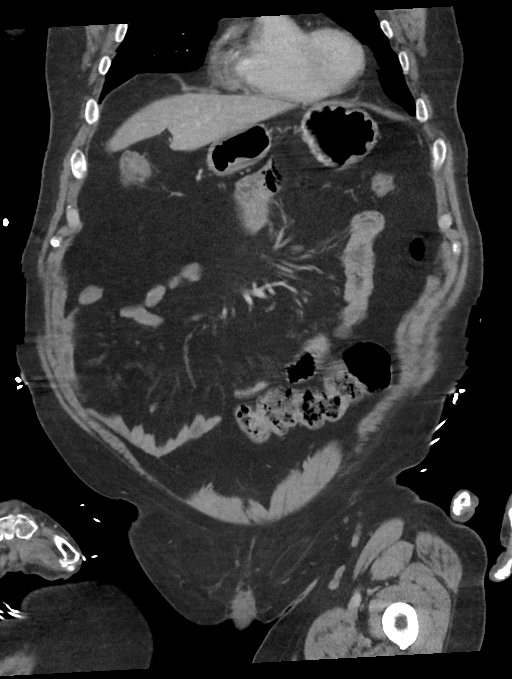
[im 45/100  soft-tissue]
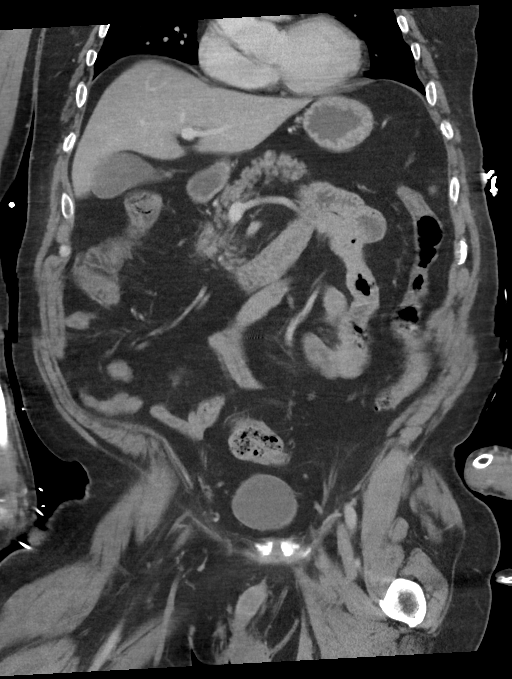
[im 56/100  soft-tissue]
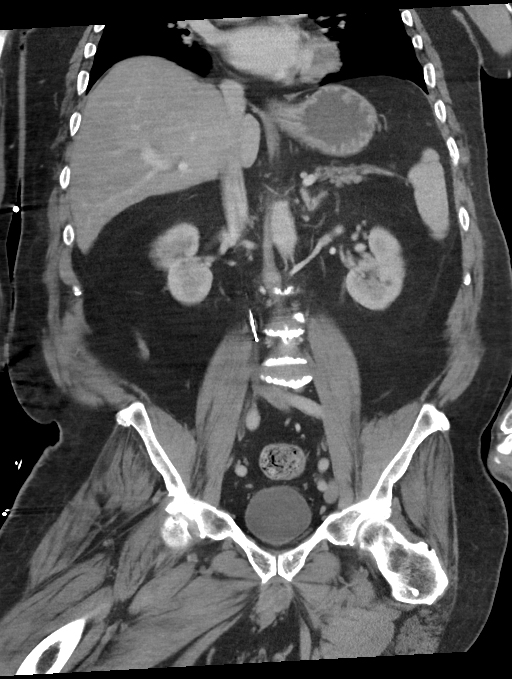

[16 of 46 positions shown; findings below may reference images not displayed]

RADIATION DOSE REDUCTION: This exam was performed according to the
departmental dose-optimization program which includes automated
exposure control, adjustment of the mA and/or kV according to
patient size and/or use of iterative reconstruction technique.

CONTRAST:  100mL OMNIPAQUE IOHEXOL 300 MG/ML  SOLN
FINDINGS: Lower chest: Atelectasis in the lung bases. Fluid-filled mildly
distended esophagus, likely indicating reflux disease or
dysmotility.

Hepatobiliary: No focal liver abnormality is seen. No gallstones,
gallbladder wall thickening, or biliary dilatation.

Pancreas: Unremarkable. No pancreatic ductal dilatation or
surrounding inflammatory changes.

Spleen: Normal in size without focal abnormality.

Adrenals/Urinary Tract: No adrenal gland nodules. Kidneys are
symmetrical. No hydronephrosis or hydroureter. No solid mass
lesions. Bladder is normal.

Stomach/Bowel: Stomach, small bowel, and colon are not abnormally
distended. No wall thickening or inflammatory changes. Appendix is
normal.

Vascular/Lymphatic: Scattered calcification of the aorta. No
aneurysm. Inferior vena caval filter is present.

Reproductive: Prostate gland is mildly enlarged.

Other: No abdominal wall hernia or abnormality. No abdominopelvic
ascites.

Musculoskeletal: Degenerative changes in the spine. No destructive
bone lesions.
IMPRESSION: 1. Fluid-filled distended esophagus likely indicating reflux disease
or dysmotility. No obstructing lesion is identified.
2. No evidence of bowel obstruction or inflammation.
3. Aortic atherosclerosis.

## 2021-06-12 MED ORDER — IOHEXOL 300 MG/ML  SOLN
100.0000 mL | Freq: Once | INTRAMUSCULAR | Status: AC | PRN
  Administered 2021-06-12: 100 mL via INTRAVENOUS

## 2021-06-12 MED ORDER — ACETAMINOPHEN 650 MG RE SUPP: 650.0000 mg | Freq: Four times a day (QID) | RECTAL | Status: DC | PRN

## 2021-06-12 MED ORDER — ONDANSETRON HCL 4 MG/2ML IJ SOLN
4.0000 mg | Freq: Four times a day (QID) | INTRAMUSCULAR | Status: DC | PRN
  Administered 2021-06-13: 4 mg via INTRAVENOUS
  Filled 2021-06-12: qty 2

## 2021-06-12 MED ORDER — PANTOPRAZOLE SODIUM 40 MG IV SOLR
40.0000 mg | Freq: Once | INTRAVENOUS | Status: AC
  Administered 2021-06-12: 40 mg via INTRAVENOUS
  Filled 2021-06-12: qty 10

## 2021-06-12 MED ORDER — MAGNESIUM HYDROXIDE 400 MG/5ML PO SUSP: 30.0000 mL | Freq: Every day | ORAL | Status: DC | PRN

## 2021-06-12 MED ORDER — POTASSIUM CHLORIDE 20 MEQ PO PACK
40.0000 meq | PACK | Freq: Once | ORAL | Status: AC
  Administered 2021-06-13: 40 meq via ORAL
  Filled 2021-06-12: qty 2

## 2021-06-12 MED ORDER — TRAZODONE HCL 50 MG PO TABS: 25.0000 mg | ORAL_TABLET | Freq: Every evening | ORAL | Status: DC | PRN

## 2021-06-12 MED ORDER — ONDANSETRON HCL 4 MG PO TABS: 4.0000 mg | ORAL_TABLET | Freq: Four times a day (QID) | ORAL | Status: DC | PRN

## 2021-06-12 MED ORDER — LORAZEPAM 2 MG/ML IJ SOLN
INTRAMUSCULAR | Status: AC
Start: 2021-06-12 — End: 2021-06-12
  Filled 2021-06-12: qty 1

## 2021-06-12 MED ORDER — ATROPINE SULFATE 1 MG/10ML IJ SOSY
PREFILLED_SYRINGE | INTRAMUSCULAR | Status: AC
  Administered 2021-06-12: 0.5 mg
  Filled 2021-06-12: qty 10

## 2021-06-12 MED ORDER — ACETAMINOPHEN 325 MG PO TABS
650.0000 mg | ORAL_TABLET | Freq: Four times a day (QID) | ORAL | Status: DC | PRN
  Administered 2021-06-13: 650 mg via ORAL
  Filled 2021-06-12: qty 2

## 2021-06-12 MED ORDER — LORAZEPAM 2 MG/ML IJ SOLN
1.0000 mg | INTRAMUSCULAR | Status: DC | PRN
Start: 2021-06-12 — End: 2021-06-14

## 2021-06-12 MED ORDER — SODIUM CHLORIDE 0.9 % IV SOLN: INTRAVENOUS | Status: DC

## 2021-06-12 MED ORDER — ENOXAPARIN SODIUM 60 MG/0.6ML IJ SOSY
0.5000 mg/kg | PREFILLED_SYRINGE | INTRAMUSCULAR | Status: DC
  Administered 2021-06-13: 45 mg via SUBCUTANEOUS
  Filled 2021-06-12: qty 0.6

## 2021-06-12 MED ORDER — SODIUM CHLORIDE 0.9 % IV BOLUS
1000.0000 mL | Freq: Once | INTRAVENOUS | Status: AC
Start: 2021-06-12 — End: 2021-06-13
  Administered 2021-06-12: 1000 mL via INTRAVENOUS

## 2021-06-12 NOTE — ED Notes (Signed)
Seizure like activity noted. Family called RN into room and RN notified provider during episode, provider at bedside. Pt did not have any convulsions or nystagmus. Pt was not able to be stimulated with tactile stimulation. Provider ordered 2mg  of ativan to be given IV.

## 2021-06-12 NOTE — Progress Notes (Signed)
Anticoagulation monitoring(Lovenox):  61 yo male ordered Lovenox 40 mg Q24h    Filed Weights   06/12/21 1932  Weight: 90.7 kg (200 lb)   BMI 30.4   Lab Results  Component Value Date   CREATININE 1.57 (H) 06/12/2021   CREATININE 0.89 10/02/2020   CREATININE 0.89 06/06/2020   Estimated Creatinine Clearance: 54.7 mL/min (A) (by C-G formula based on SCr of 1.57 mg/dL (H)). Hemoglobin & Hematocrit     Component Value Date/Time   HGB 13.1 06/12/2021 1954   HCT 39.1 06/12/2021 1954     Per Protocol for Patient with estCrcl > 30 ml/min and BMI > 30, will transition to Lovenox 45 mg Q24h.

## 2021-06-12 NOTE — ED Notes (Signed)
Assumed care of patient. Upon initial assessment, patient with bradycardia down to 39. Dr. Derrill Kay arrived promptly to bedside, EKG performed. IV atropine administered.

## 2021-06-12 NOTE — ED Triage Notes (Signed)
Pt arrived via EMS from home. Pt was released from [facility] recently due to epilepsy. Per EMS when they arrived on scene pt was postictal and bradycardia. EMS gave atropine due to bradycardia in low 40's. Pt has had a previous stroke with right sided paralysis. Pt is at his baseline at this time.

## 2021-06-12 NOTE — ED Provider Notes (Signed)
[facility] Provider Note    Event Date/Time   First MD Initiated Contact with Patient 06/12/21 1930     (approximate)   History   Seizures   HPI  Jonathan Newman is a 61 y.o. male who, per discharge summary from [facility] dated 4 days ago had admission for seizure like activity and had negative 24 hour EEG, who presents to the emergency department today via EMS because of concern for seizure.  Per family the patient typically just stares off with his seizures but they did notice today a slightly different seizure.  They stated that it is more his whole body and it caused him to fall out of his bed which would be unusual.  The patient has continued to have nausea and vomiting which was occurring during his previous admission.  They state that he has had very poor oral intake.   Physical Exam   Triage Vital Signs: ED Triage Vitals  Enc Vitals Group     BP 06/12/21 1936 98/71     Pulse Rate 06/12/21 1936 62     Resp 06/12/21 1936 18     Temp 06/12/21 1936 97.6 F (36.4 C)     Temp Source 06/12/21 1936 Oral     SpO2 06/12/21 1936 94 %     Weight 06/12/21 1932 200 lb (90.7 kg)   Most recent vital signs: Vitals:   06/12/21 1936  BP: 98/71  Pulse: 62  Resp: 18  Temp: 97.6 F (36.4 C)  SpO2: 94%   General: Awake, alert. Non verbal CV:  Good peripheral perfusion. Bradycardia Resp:  Normal effort. Lungs clear. Abd:  No distention. Non tender. Other:  Awake, alert. Non verbal but able to nod yes and no to questions.   ED Results / Procedures / Treatments   Labs (all labs ordered are listed, but only abnormal results are displayed) Labs Reviewed  CBC WITH DIFFERENTIAL/PLATELET - Abnormal; Notable for the following components:      Result Value   RBC 3.99 (*)    All other components within normal limits  COMPREHENSIVE METABOLIC PANEL - Abnormal; Notable for the following components:   Potassium 3.3 (*)    BUN 45 (*)    Creatinine, Ser 1.57  (*)    Calcium 8.3 (*)    GFR, Estimated 50 (*)    All other components within normal limits  LIPASE, BLOOD  URINALYSIS, ROUTINE W REFLEX MICROSCOPIC     EKG  I, Phineas Semen, attending physician, personally viewed and interpreted this EKG  EKG Time: 2329 Rate: 40 Rhythm: sinus bradycardia Axis: normal Intervals: qtc 404 QRS: narrow ST changes: no st elevation Impression: abnormal ekg   RADIOLOGY  I independently interpreted and visualized the CT abd/pel. My interpretation: No free air Radiology interpretation:  IMPRESSION:  1. Fluid-filled distended esophagus likely indicating reflux disease  or dysmotility. No obstructing lesion is identified.  2. No evidence of bowel obstruction or inflammation.  3. Aortic atherosclerosis    I independently interpreted and visualized the CT head. My interpretation: No acute bleed, old infarct Radiology interpretation:  IMPRESSION:  No acute intracranial abnormality.     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 Old left basal ganglia lacunar  infarct. Small vessel ischemic changes.        PROCEDURES:  Critical Care performed: No  Procedures   MEDICATIONS ORDERED IN ED: Medications - No data to display   IMPRESSION / MDM / ASSESSMENT AND PLAN / ED COURSE  I reviewed the triage vital signs and the nursing notes.                              Differential diagnosis includes, but is not limited to, hyponatremia, medication non compliance, head bleed.  Patient presented to the emergency department today after concerns for seizure activity.  Patient had recent admission to [facility] for seizures.  Family however states that the seizure they saw today was unlike his normal seizures.  Patient was awake and alert on initial examination.  He then did have what I believed to be a seizure while here in the emergency department which was more in line with his reported staring type seizures.  Because of this he was given Ativan.   Did obtain a head CT which did not show any acute abnormality.  Additionally given reports of nausea vomiting I obtained a CT abdomen pelvis which was concerning for possible reflux.  I do wonder if this is causing some of the patient's poor oral intake.  Creatinine was elevated here which is consistent with poor oral intake.  Will be started on IV fluids.  Discussed with Dr. Sidney Ace with the hospitalist service who will plan on admission.  After hospitalist service was paged and during consultation nursing staff alerted me that patients hear rate had dropped into the thirties. EKG was done at that time which showed a sinus bradycardia at 40. Patient with strong peripheral pulses. Did instruct nursing staff to give atropine to see if it helps with the heart rate.    FINAL CLINICAL IMPRESSION(S) / ED DIAGNOSES   Final diagnoses:  Seizure ([place])  Bradycardia    Note:  This document was prepared using Dragon voice recognition software and may include unintentional dictation errors.    Nance Pear, MD 06/12/21 [phone]

## 2021-06-13 ENCOUNTER — Other Ambulatory Visit: Payer: Self-pay

## 2021-06-13 ENCOUNTER — Encounter: Payer: Self-pay | Admitting: Family Medicine

## 2021-06-13 ENCOUNTER — Inpatient Hospital Stay: Payer: Medicaid Other

## 2021-06-13 DIAGNOSIS — Z8673 Personal history of transient ischemic attack (TIA), and cerebral infarction without residual deficits: Secondary | ICD-10-CM

## 2021-06-13 DIAGNOSIS — F32A Depression, unspecified: Secondary | ICD-10-CM

## 2021-06-13 DIAGNOSIS — E876 Hypokalemia: Secondary | ICD-10-CM

## 2021-06-13 DIAGNOSIS — N179 Acute kidney failure, unspecified: Secondary | ICD-10-CM | POA: Diagnosis not present

## 2021-06-13 DIAGNOSIS — E785 Hyperlipidemia, unspecified: Secondary | ICD-10-CM

## 2021-06-13 DIAGNOSIS — R569 Unspecified convulsions: Secondary | ICD-10-CM | POA: Diagnosis not present

## 2021-06-13 LAB — GASTROINTESTINAL PANEL BY PCR, STOOL (REPLACES STOOL CULTURE)

## 2021-06-13 LAB — BASIC METABOLIC PANEL
Anion gap: 7 (ref 5–15)
BUN: 42 mg/dL — ABNORMAL HIGH (ref 6–20)
CO2: 30 mmol/L (ref 22–32)
Calcium: 8.1 mg/dL — ABNORMAL LOW (ref 8.9–10.3)
Chloride: 101 mmol/L (ref 98–111)
Creatinine, Ser: 1.31 mg/dL — ABNORMAL HIGH (ref 0.61–1.24)
GFR, Estimated: >60 mL/min (ref >60)
Glucose, Bld: 85 mg/dL (ref 70–99)
Potassium: 3.7 mmol/L (ref 3.5–5.1)
Sodium: 138 mmol/L (ref 135–145)

## 2021-06-13 LAB — C DIFFICILE QUICK SCREEN W PCR REFLEX
C Diff antigen: NEGATIVE
C Diff interpretation: NOT DETECTED
C Diff toxin: NEGATIVE

## 2021-06-13 LAB — CBC
HCT: 36.1 % — ABNORMAL LOW (ref 39.0–52.0)
Hemoglobin: 12.4 g/dL — ABNORMAL LOW (ref 13.0–17.0)
MCH: 33.6 pg (ref 26.0–34.0)
MCHC: 34.3 g/dL (ref 30.0–36.0)
MCV: 97.8 fL (ref 80.0–100.0)
Platelets: 220 10*3/uL (ref 150–400)
RBC: 3.69 MIL/uL — ABNORMAL LOW (ref 4.22–5.81)
RDW: 13.7 % (ref 11.5–15.5)
WBC: 6.4 10*3/uL (ref 4.0–10.5)
nRBC: 0 % (ref 0.0–0.2)

## 2021-06-13 LAB — URINALYSIS, ROUTINE W REFLEX MICROSCOPIC
Bilirubin Urine: NEGATIVE
Glucose, UA: NEGATIVE mg/dL
Hgb urine dipstick: NEGATIVE
Ketones, ur: NEGATIVE mg/dL
Leukocytes,Ua: NEGATIVE
Nitrite: NEGATIVE
Protein, ur: NEGATIVE mg/dL
Specific Gravity, Urine: >1.046 — ABNORMAL HIGH (ref 1.005–1.030)
pH: 5 (ref 5.0–8.0)

## 2021-06-13 LAB — TROPONIN I (HIGH SENSITIVITY)
Troponin I (High Sensitivity): 7 ng/L (ref <18)
Troponin I (High Sensitivity): 7 ng/L (ref <18)

## 2021-06-13 LAB — HEMOGLOBIN A1C
Hgb A1c MFr Bld: 5.5 % (ref 4.8–5.6)
Mean Plasma Glucose: 111.15 mg/dL

## 2021-06-13 LAB — VALPROIC ACID LEVEL: Valproic Acid Lvl: 66 ug/mL (ref 50.0–100.0)

## 2021-06-13 LAB — MAGNESIUM: Magnesium: 2.2 mg/dL (ref 1.7–2.4)

## 2021-06-13 LAB — HIV ANTIBODY (ROUTINE TESTING W REFLEX): HIV Screen 4th Generation wRfx: NONREACTIVE

## 2021-06-13 IMAGING — MR MR HEAD W/O CM
12 of 13 series · 38 of 48 positions shown · non-contrast
Comparison: CT head [DATE].  MRI head [DATE]

CLINICAL DATA: Seizure disorder.

EXAM:
MRI HEAD WITHOUT CONTRAST
TECHNIQUE: Multiplanar, multiecho pulse sequences of the brain and surrounding
structures were obtained without intravenous contrast.

[Series 5: ax dwi_tracew · axial · 3.0mm · 0.71mm/px · z∈[-106,+59]mm · 3 of 56 slices shown]
[im 1/56]
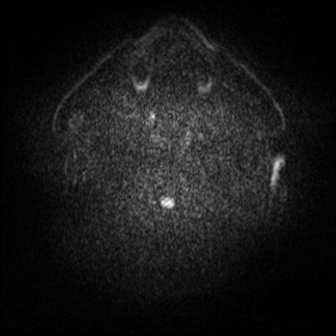
[im 28/56]
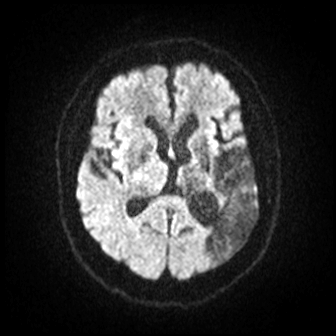
[im 56/56]
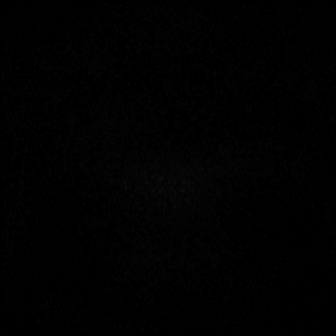

[Series 6: ax dwi_adc · axial · 3.0mm · 0.71mm/px · z∈[-106,+56]mm · 3 of 55 slices shown]
[im 1/55]
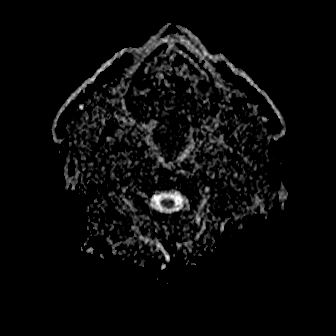
[im 28/55]
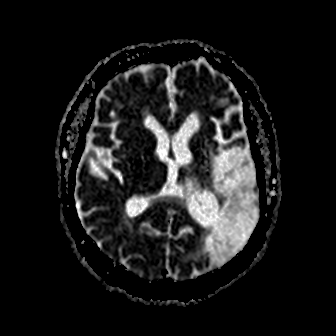
[im 55/55]
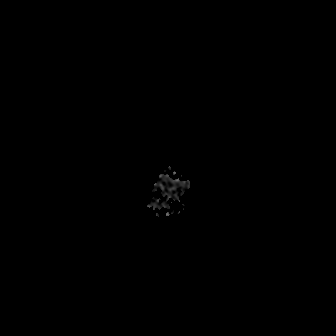

[Series 7: cor dwi_tracew · coronal · 5.0mm · 0.68mm/px · 3 of 40 slices shown]
[im 1/40]
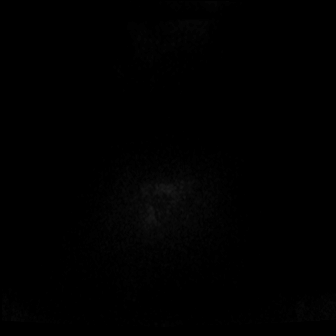
[im 20/40]
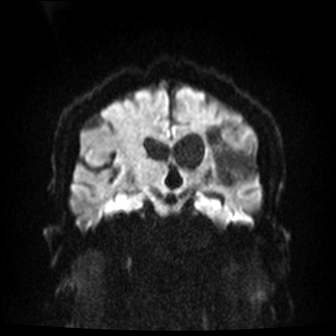
[im 40/40]
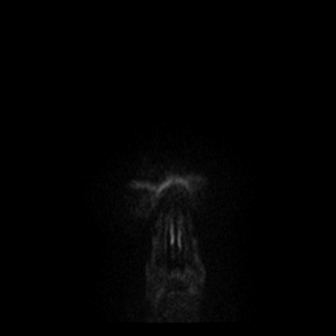

[Series 8: cor dwi_adc · coronal · 5.0mm · 0.68mm/px · 3 of 40 slices shown]
[im 1/40]
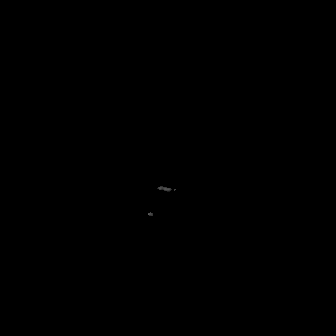
[im 20/40]
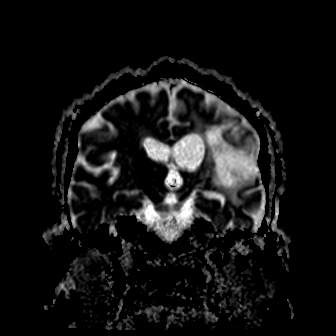
[im 40/40]
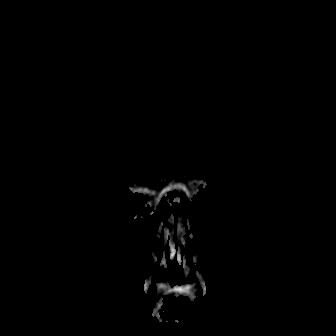

[Series 9: T1 · sagittal · 5.0mm · 0.47mm/px · 2 of 24 slices shown (1 of 2)]
[im 1/24]
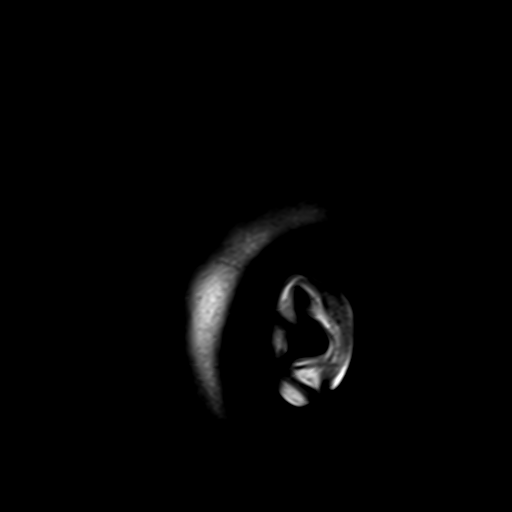
[im 24/24]
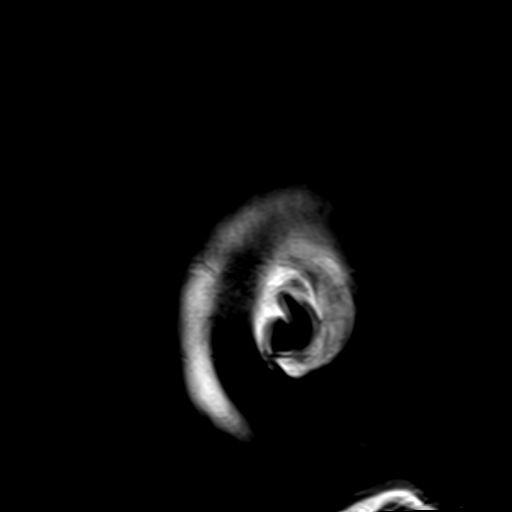

[Series 10: T2 · axial · 5.0mm · 0.86mm/px · z∈[-96,+48]mm · 2 of 25 slices shown (1 of 3)]
[im 1/25]
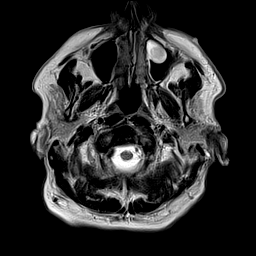
[im 25/25]
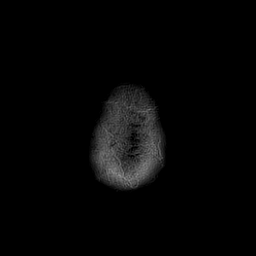

[Series 12: ax swi_pha · axial · 3.0mm · 0.90mm/px · z∈[-107,+58]mm · 4 of 56 slices shown]
[im 1/56]
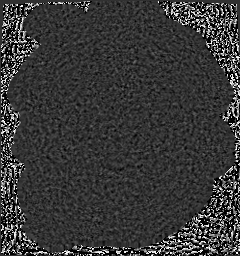
[im 19/56]
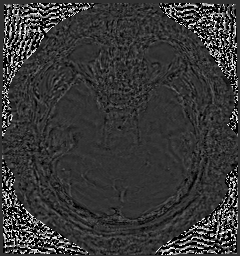
[im 37/56]
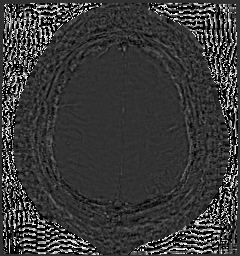
[im 56/56]
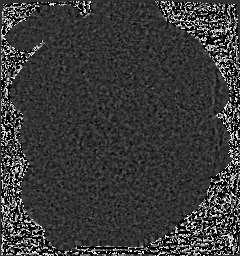

[Series 13: ax swi_swi · axial · 3.0mm · 0.90mm/px · z∈[-107,+58]mm · 3 of 55 slices shown]
[im 1/55]
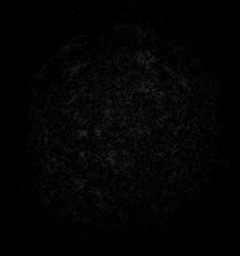
[im 28/55]
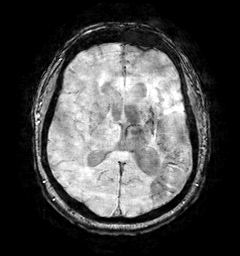
[im 55/55]
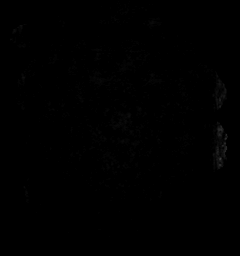

[Series 15: FLAIR · axial · 3.0mm · 0.69mm/px · z∈[-105,+57]mm · 3 of 55 slices shown]
[im 1/55]
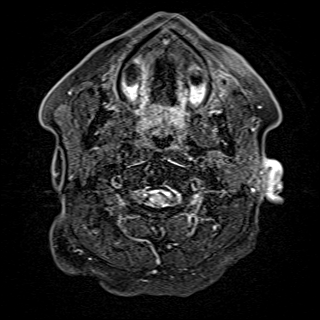
[im 28/55]
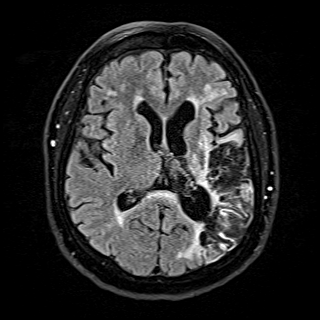
[im 55/55]
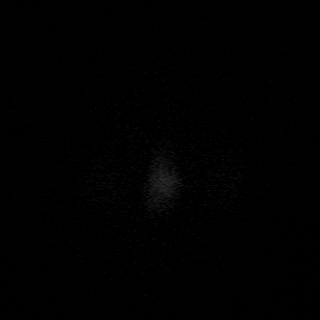

[Series 16: T1 · axial · 1.0mm · 0.98mm/px · z∈[-112,+63]mm · 8 of 174 slices shown (2 of 2)]
[im 1/174]
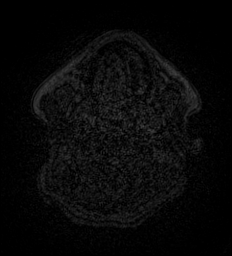
[im 35/174]
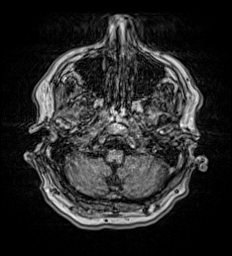
[im 52/174]
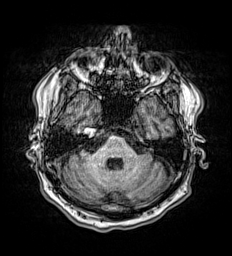
[im 70/174]
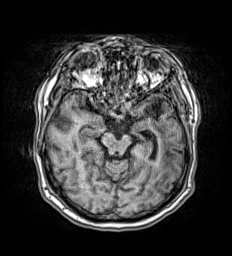
[im 104/174]
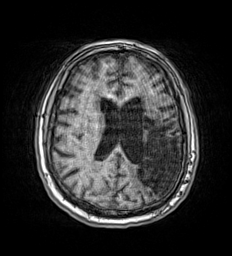
[im 122/174]
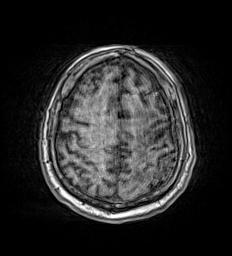
[im 139/174]
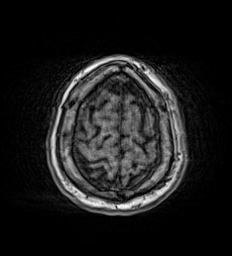
[im 174/174]
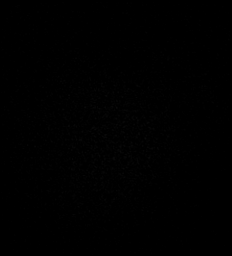

[Series 17: T2 · coronal · 5.0mm · 0.45mm/px · 2 of 31 slices shown (2 of 3)]
[im 1/31]
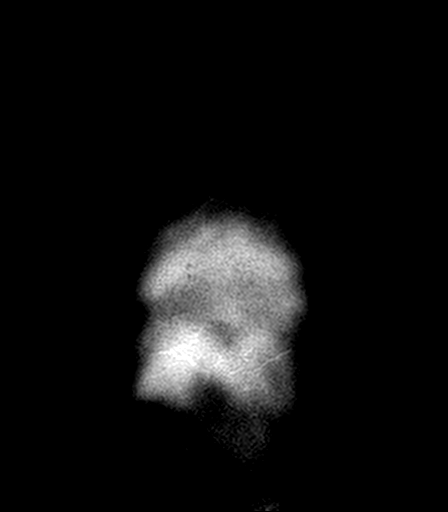
[im 31/31]
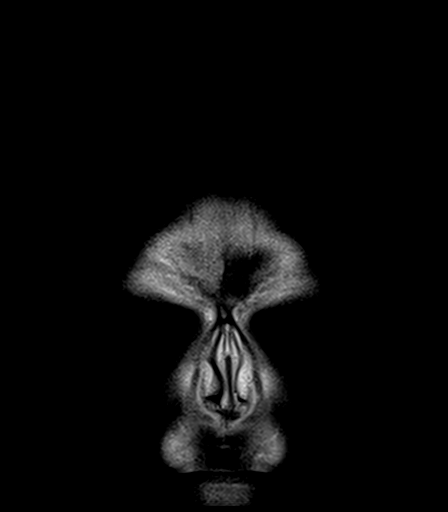

[Series 18: T2 · coronal · 3.0mm · 0.23mm/px · 2 of 35 slices shown (3 of 3)]
[im 1/35]
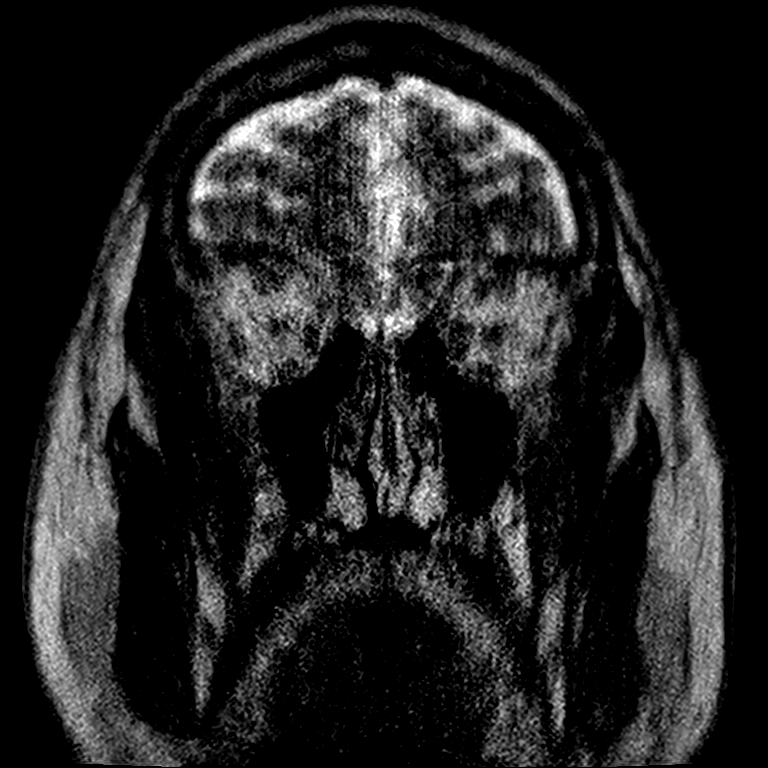
[im 35/35]
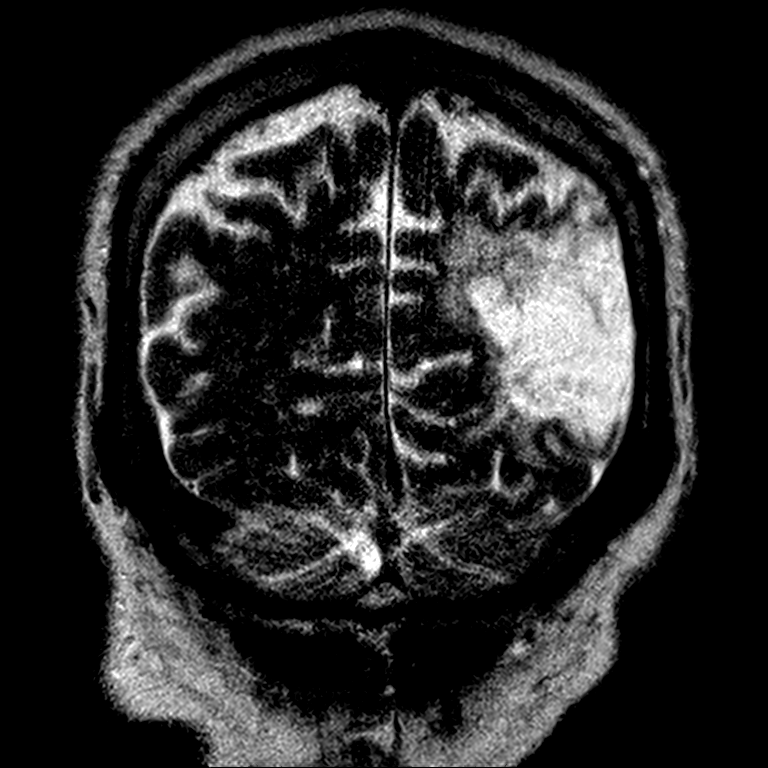

[38 of 48 positions shown; findings below may reference images not displayed]

FINDINGS: Brain: Negative for acute infarct. Large territory chronic infarct
left posterior MCA territory with chronic hemorrhage. Wallerian
degeneration left cerebral peduncle. Chronic lacunar infarction in
the right pons and left posterior thalamus. Chronic microvascular
ischemic change in the white matter. Negative for hydrocephalus.
Negative for mass or fluid collection

Vascular: Normal arterial flow voids at the skull base. Possible
occlusion left middle cerebral artery. This is unchanged from [9E].

Skull and upper cervical spine: No focal skeletal lesion.

Sinuses/Orbits: Retention cyst left maxillary sinus. Remaining
sinuses clear. Negative orbit

Other: None
IMPRESSION: No acute intracranial abnormality

Large territory chronic infarct left posterior MCA territory.
Additional areas of chronic ischemia as described above.

## 2021-06-13 MED ORDER — DIVALPROEX SODIUM 500 MG PO DR TAB
500.0000 mg | DELAYED_RELEASE_TABLET | Freq: Two times a day (BID) | ORAL | Status: DC
  Administered 2021-06-13 – 2021-06-14 (×3): 500 mg via ORAL
  Filled 2021-06-13 (×3): qty 1

## 2021-06-13 MED ORDER — QUETIAPINE FUMARATE 25 MG PO TABS
25.0000 mg | ORAL_TABLET | Freq: Every day | ORAL | Status: DC
  Administered 2021-06-13: 25 mg via ORAL
  Filled 2021-06-13: qty 1

## 2021-06-13 MED ORDER — POTASSIUM CHLORIDE 20 MEQ PO PACK
40.0000 meq | PACK | Freq: Once | ORAL | Status: AC
  Administered 2021-06-13: 40 meq via ORAL
  Filled 2021-06-13: qty 2

## 2021-06-13 MED ORDER — SODIUM CHLORIDE 0.9 % IV BOLUS
500.0000 mL | Freq: Once | INTRAVENOUS | Status: AC
Start: 2021-06-13 — End: 2021-06-13
  Administered 2021-06-13: 500 mL via INTRAVENOUS

## 2021-06-13 MED ORDER — LISINOPRIL 20 MG PO TABS: 20.0000 mg | ORAL_TABLET | Freq: Every day | ORAL | Status: DC

## 2021-06-13 MED ORDER — TAMSULOSIN HCL 0.4 MG PO CAPS
0.4000 mg | ORAL_CAPSULE | Freq: Every day | ORAL | Status: DC
Start: 2021-06-13 — End: 2021-06-14
  Filled 2021-06-13: qty 1

## 2021-06-13 MED ORDER — STROKE: EARLY STAGES OF RECOVERY BOOK: Freq: Once | Status: AC

## 2021-06-13 MED ORDER — SERTRALINE HCL 50 MG PO TABS
25.0000 mg | ORAL_TABLET | Freq: Every day | ORAL | Status: DC
  Administered 2021-06-13 – 2021-06-14 (×2): 25 mg via ORAL
  Filled 2021-06-13 (×2): qty 1

## 2021-06-13 MED ORDER — APIXABAN 5 MG PO TABS
5.0000 mg | ORAL_TABLET | Freq: Two times a day (BID) | ORAL | Status: DC
Start: 2021-06-13 — End: 2021-06-14
  Administered 2021-06-13 – 2021-06-14 (×3): 5 mg via ORAL
  Filled 2021-06-13 (×3): qty 1

## 2021-06-13 MED ORDER — ATROPINE SULFATE 1 MG/10ML IJ SOSY
0.5000 mg | PREFILLED_SYRINGE | Freq: Once | INTRAMUSCULAR | Status: AC
  Administered 2021-06-13: 0.5 mg via INTRAVENOUS
  Filled 2021-06-13: qty 10

## 2021-06-13 NOTE — Evaluation (Signed)
Occupational Therapy Evaluation Patient Details Name: Jonathan Newman MRN: [id number] DOB: 05/28/1960 Today's Date: 06/13/2021   History of Present Illness 61 y.o. male who, per discharge summary from [facility] dated 4 days ago had admission for seizure like activity and had negative 24 hour EEG, who presents to the emergency department today via EMS because of concern for seizure.  Per family the patient typically just stares off with his seizures but they did notice today a slightly different seizure.  They stated that it is more his whole body and it caused him to fall out of his bed which would be unusual.   Clinical Impression    Patient presenting with  decreased Ind in self care, balance, functional mobility/transfers, endurance, and safety awareness. Patient's sister is present in the room and she provides care for him at baseline. Pt transfers with sister into power wheelchair, onto toilet, and onto shower chair at home. Pt needs assistance for self care tasks and cuing/guidance for safety awareness as he is impulsive at baseline. R hemi from prior CVA recently getting outpt PT and SLP.  Patient currently functioning at mod A for bed mobility and min-mod of 2 to stand . Family wants to take pt home at hospital discharge and requesting to continue outpt therapy services. Will continue to address functional transfers and endurance while in hospital. Patient will benefit from acute OT to increase overall independence in the areas of ADLs, functional mobility, and safety awareness in order to safely discharge home.    Recommendations for follow up therapy are one component of a multi-disciplinary discharge planning process, led by the attending physician.  Recommendations may be updated based on patient status, additional functional criteria and insurance authorization.   Follow Up Recommendations  Outpatient OT    Assistance Recommended at Discharge Frequent or constant Supervision/Assistance   Patient can return home with the following A lot of help with walking and/or transfers;A lot of help with bathing/dressing/bathroom    Functional Status Assessment  Patient has had a recent decline in their functional status and demonstrates the ability to make significant improvements in function in a reasonable and predictable amount of time.  Equipment Recommendations  None recommended by OT       Precautions / Restrictions Precautions Precautions: Fall      Mobility Bed Mobility Overal bed mobility: Needs Assistance Bed Mobility: Supine to Sit     Supine to sit: Mod assist     General bed mobility comments: for trunk support and R LE    Transfers Overall transfer level: Needs assistance Equipment used: 2 person hand held assist Transfers: Sit to/from Stand Sit to Stand: Min assist, Mod assist, +2 physical assistance, +2 safety/equipment           General transfer comment: B feet and R knee blocked for pt to stand with +2 assistance from standard height bed      Balance Overall balance assessment: Needs assistance Sitting-balance support: Feet supported, Single extremity supported Sitting balance-Leahy Scale: Fair     Standing balance support: Single extremity supported Standing balance-Leahy Scale: Poor                             ADL either performed or assessed with clinical judgement   ADL Overall ADL's : Needs assistance/impaired Eating/Feeding: Set up;Minimal assistance;Cueing for safety;Cueing for sequencing;Bed level  Vision Patient Visual Report: No change from baseline              Pertinent Vitals/Pain Pain Assessment Pain Assessment: Faces Faces Pain Scale: No hurt     Hand Dominance Left   Extremity/Trunk Assessment Upper Extremity Assessment Upper Extremity Assessment: RUE deficits/detail RUE Deficits / Details: impaired from prior CVA            Communication Communication Communication: Expressive difficulties   Cognition Arousal/Alertness: Awake/alert Behavior During Therapy: Impulsive, WFL for tasks assessed/performed Overall Cognitive Status: History of cognitive impairments - at baseline                                 General Comments: Sister does report he appears more sleepy but he follows 1 step commands with increased cuing.                Home Living Family/patient expects to be discharged to:: Private residence Living Arrangements: Other relatives (sister) Available Help at Discharge: Family;Available 24 hours/day Type of Home: Mobile home Home Access: Ramped entrance     Home Layout: One level     Bathroom Shower/Tub: Walk-in shower         Home Equipment: Wheelchair - power;Tub bench;Hospital bed;Wheelchair - manual          Prior Functioning/Environment Prior Level of Function : Needs assist             Mobility Comments: Pt transfers with assistance from sister to power wheelchair, into car, and onto TTB for bathing. Use of manual wheelchair in community with sister pushing him. ADLs Comments: Sister assists pt with self care and IADLs. He does feed himself with cuing for safety and supervision.        OT Problem List: Decreased strength;Decreased coordination;Decreased activity tolerance;Decreased safety awareness;Impaired balance (sitting and/or standing);Decreased knowledge of use of DME or AE;Decreased knowledge of precautions      OT Treatment/Interventions: Self-care/ADL training;Manual therapy;Therapeutic exercise;Patient/family education;Balance training;Energy conservation;Therapeutic activities;DME and/or AE instruction    OT Goals(Current goals can be found in the care plan section) Acute Rehab OT Goals Patient Stated Goal: to go home OT Goal Formulation: With family Time For Goal Achievement: 06/27/21 Potential to Achieve Goals: Fair ADL Goals Pt Will  Perform Grooming: with set-up;with supervision;sitting Pt Will Perform Upper Body Dressing: with min assist;sitting Pt Will Perform Lower Body Dressing: with mod assist;sit to/from stand Pt Will Transfer to Toilet: with min assist Pt Will Perform Toileting - Clothing Manipulation and hygiene: with min assist  OT Frequency: Min 2X/week       AM-PAC OT "6 Clicks" Daily Activity     Outcome Measure Help from another person eating meals?: A Little Help from another person taking care of personal grooming?: A Little Help from another person toileting, which includes using toliet, bedpan, or urinal?: A Lot Help from another person bathing (including washing, rinsing, drying)?: A Lot Help from another person to put on and taking off regular upper body clothing?: A Lot Help from another person to put on and taking off regular lower body clothing?: Total 6 Click Score: 13   End of Session    Activity Tolerance: Patient tolerated treatment well;Patient limited by fatigue Patient left: in bed;with call bell/phone within reach;with family/visitor present;Other (comment) (PT in room with pt)  OT Visit Diagnosis: Unsteadiness on feet (R26.81);Repeated falls (R29.6);Muscle weakness (generalized) (M62.81);History of falling (Z91.81);Hemiplegia and hemiparesis Hemiplegia - Right/Left:  Right                Time: 1024-1040 OT Time Calculation (min): 16 min Charges:  OT General Charges $OT Visit: 1 Visit OT Evaluation $OT Eval Moderate Complexity: 1 [address] [place], OTR/L , CBIS ascom [phone]  06/13/21, 1:57 PM

## 2021-06-13 NOTE — Progress Notes (Signed)
SLP Cancellation Note  Patient Details Name: Jonathan Newman MRN: [id number] DOB: 10-19-1960   Cancelled treatment:       Reason Eval/Treat Not Completed: SLP screened, no needs identified, will sign off. ST cognitive-linguistic eval orders received. Pt known to this therapist from outpatient therapy. Spoke with pt and sister; pt appears at baseline functional level with regard to communication and cognitive status. Pt with baseline right facial droop/weakness. Pt appears alert, is able to respond to simple questions with gestures, vocalizations (chronic global aphasia at baseline) and make selections on his communication device (tablet) to indicate feelings and basic needs requests. Nsg present and report pt has been cleared for regular diet. Please feel free to reconsult ST if worsening cognitive-communication status or if any difficulties noted with regular diet.  Rondel Baton, [place], [employer] Pathologist [phone]    Jonathan Newman 06/13/2021, 10:14 AM

## 2021-06-13 NOTE — Assessment & Plan Note (Addendum)
-  History of large left MCA/PCA infarct - Residual right sided flaccid hemiparesis, right hemisensory loss, and global aphasia - also had repeat MRI brain at [facility] on 5/18 which showed "small recent infarction involving the right lentiform nucleus". Noted in d/c summary and was not felt to be contributing to his further absence seizures

## 2021-06-13 NOTE — Assessment & Plan Note (Addendum)
-   baseline creatinine ~ 0.9 - patient presents with increase in creat >0.3 mg/dL above baseline, creat increase >1.5x baseline presumed to have occurred within past 7 days PTA -Creatinine 1.57 on admission.  Etiology considered prerenal from GI losses -Creatinine improved with fluids

## 2021-06-13 NOTE — Assessment & Plan Note (Addendum)
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 He was recently hospitalized at [facility] from 5/17 - 5/19 for similar; 24 hr EEG negative; there was report of 4 episodes diarrhea & nausea raising suspicion for viral illness contributing (no change in meds and supportive care otherwise recommended); of note was positive for THC on UDS as well (patient bedbound/WC bound with severe aphasia at baseline too, thus would be rather dependent on family help) - differential includes low/normal depakote level causing breakthru possibly with superimposed infection still at play (sister does report some ongoing vomiting at home prior to admission) VS his typical frequency which may be evolving from his dense chronic CVA and/or from new CVA seen on recent MRI on 06/07/21 -Depakote level on admission lower end of normal range -Patient also evaluated by neurology during hospitalization - Continue Depakote at current dosing for now -Obvious infection ruled out and negative stool studies in setting of his diarrhea complaints prior to admission; work-up was also negative for diarrhea recently at [facility]

## 2021-06-13 NOTE — Evaluation (Signed)
Physical Therapy Evaluation Patient Details Name: Jonathan Newman MRN: [id number] DOB: 1960/09/24 Today's Date: 06/13/2021  History of Present Illness  Pt is a 61 y.o. male with PMH that includes CVA with R-sided weakness who per discharge summary from [facility] had recent admission for seizure like activity and had negative 24 hour EEG, who presented to the ED via EMS because of concern for seizure.  Per family the patient typically just stares off with his seizures but they did notice today a slightly different seizure.  They stated that it is more his whole body and it caused him to fall out of his bed which would be unusual.  MD assessment includes: seizure, hypokalemia, AKI, and depression.   Clinical Impression  Pt anxious to attempt functional mobility tasks but with encouragement was willing to sit at EOB and participate in sit to/from stand training.  Pt able to come to standing twice with min A and +2 HHA for pt confidence.  Pt required no physical assistance to go from sitting to supine with pt using his LLE to assist his RLE into bed.  Per pt's sister who was in the room the pt appears close to his functional baseline with sister stating that she felt comfortable with pt returning home under her care once medically appropriate for discharge.  Pt was actively receiving OPPT prior to this admission with the recommendation being for patient to resume OPPT upon discharge.         Recommendations for follow up therapy are one component of a multi-disciplinary discharge planning process, led by the attending physician.  Recommendations may be updated based on patient status, additional functional criteria and insurance authorization.  Follow Up Recommendations Outpatient PT    Assistance Recommended at Discharge Frequent or constant Supervision/Assistance  Patient can return home with the following  A little help with walking and/or transfers;A little help with  bathing/dressing/bathroom;Assistance with cooking/housework;Direct supervision/assist for medications management;Direct supervision/assist for financial management;Assist for transportation;Help with stairs or ramp for entrance    Equipment Recommendations None recommended by PT  Recommendations for Other Services       Functional Status Assessment Patient has had a recent decline in their functional status and demonstrates the ability to make significant improvements in function in a reasonable and predictable amount of time.     Precautions / Restrictions Precautions Precautions: Fall Restrictions Weight Bearing Restrictions: No      Mobility  Bed Mobility Overal bed mobility: Needs Assistance Bed Mobility: Sit to Supine       Sit to supine: Supervision   General bed mobility comments: No physical assist needed during sit to sup with pt using LLE to assist RLE to bed    Transfers Overall transfer level: Needs assistance Equipment used: 2 person hand held assist Transfers: Sit to/from Stand Sit to Stand: Min assist, +2 safety/equipment           General transfer comment: Mod encouragement provided with +2 for safety and to block R knee but pt able to stand with only min A with no R knee buckling noted    Ambulation/Gait               General Gait Details: Pt declined to attempt amb  Stairs            Wheelchair Mobility    Modified Rankin (Stroke Patients Only)       Balance Overall balance assessment: Needs assistance Sitting-balance support: Feet supported, Single extremity supported Sitting balance-Leahy Scale:  Fair     Standing balance support: Bilateral upper extremity supported, During functional activity Standing balance-Leahy Scale: Poor                               Pertinent Vitals/Pain Pain Assessment Faces Pain Scale: No hurt    Home Living Family/patient expects to be discharged to:: Private residence Living  Arrangements: Other relatives (sister) Available Help at Discharge: Family;Available 24 hours/day Type of Home: Mobile home Home Access: Ramped entrance       Home Layout: One level Home Equipment: Wheelchair - power;Tub bench;Hospital bed;Wheelchair - manual      Prior Function Prior Level of Function : Needs assist             Mobility Comments: Pt transfers with assistance from sister to power wheelchair, into car, and onto TTB for bathing. Use of manual wheelchair in community with sister pushing him. ADLs Comments: Sister assists pt with self care and IADLs. He does feed himself with cuing for safety and supervision.     Hand Dominance   Dominant Hand: Left    Extremity/Trunk Assessment   Upper Extremity Assessment Upper Extremity Assessment: RUE deficits/detail RUE Deficits / Details: impaired from prior CVA    Lower Extremity Assessment Lower Extremity Assessment: Generalized weakness;RLE deficits/detail RLE Deficits / Details: chronic weakness from CVA       Communication   Communication: Expressive difficulties  Cognition Arousal/Alertness: Awake/alert Behavior During Therapy: Impulsive Overall Cognitive Status: History of cognitive impairments - at baseline                                 General Comments: Follows 1-step commands with extra time cuing        General Comments      Exercises     Assessment/Plan    PT Assessment Patient needs continued PT services  PT Problem List Decreased strength;Decreased activity tolerance;Decreased balance;Decreased mobility;Decreased knowledge of use of DME       PT Treatment Interventions DME instruction;Gait training;Functional mobility training;Therapeutic activities;Therapeutic exercise;Balance training;Patient/family education    PT Goals (Current goals can be found in the Care Plan section)  Acute Rehab PT Goals Patient Stated Goal: To return home PT Goal Formulation: With  patient/family Time For Goal Achievement: 06/26/21 Potential to Achieve Goals: Good    Frequency Min 2X/week     Co-evaluation               AM-PAC PT "6 Clicks" Mobility  Outcome Measure Help needed turning from your back to your side while in a flat bed without using bedrails?: A Little Help needed moving from lying on your back to sitting on the side of a flat bed without using bedrails?: A Little Help needed moving to and from a bed to a chair (including a wheelchair)?: A Little Help needed standing up from a chair using your arms (e.g., wheelchair or bedside chair)?: A Little Help needed to walk in hospital room?: Total Help needed climbing 3-5 steps with a railing? : Total 6 Click Score: 14    End of Session Equipment Utilized During Treatment: Gait belt Activity Tolerance: Patient tolerated treatment well Patient left: in bed;with call bell/phone within reach;with bed alarm set;with family/visitor present Nurse Communication: Mobility status PT Visit Diagnosis: Difficulty in walking, not elsewhere classified (R26.2);Muscle weakness (generalized) (M62.81)    Time: [id number] PT Time  Calculation (min) (ACUTE ONLY): 12 min   Charges:   PT Evaluation $PT Eval Moderate Complexity: 1 Mod          D. Scott Makell Cyr PT, DPT 06/13/21, 2:40 PM

## 2021-06-13 NOTE — Hospital Course (Signed)
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  He is cared for by his sister who lives with him.  He also has a recent history of falls with associated increased frequency of seizure activity after falling. He was also recently hospitalized at [facility] for similar breakthrough seizure activity.  He underwent work-up including 24-hour EEG testing.  Etiology of his breakthrough seizures was considered due to a viral illness.  No changes in medications were made.  Upon returning home his sister states that he has had ongoing increased frequency of breakthrough seizures and therefore was brought to the hospital for further evaluation.

## 2021-06-13 NOTE — Assessment & Plan Note (Addendum)
-   recently seen by cardiology on 03/21/21; interrogation showed no atrial fibrillation; 1 lifetime episode lasting 2 minutes.  He is noted to have ongoing bradycardia which is not new -He was continued on Eliquis for treatment of prior right femoral/popliteal DVT (diagnosed 01/31/20)

## 2021-06-13 NOTE — Assessment & Plan Note (Addendum)
Repleted. °

## 2021-06-13 NOTE — Assessment & Plan Note (Addendum)
-   On Lipitor at home 

## 2021-06-13 NOTE — Progress Notes (Signed)
   06/13/21 0158  Assess: MEWS Score  Temp 97.6 F (36.4 C)  BP 99/71  Pulse Rate (!) 46  ECG Heart Rate (!) 46  Resp 17  Level of Consciousness Responds to Voice  SpO2 97 %  O2 Device Room Air  Assess: if the MEWS score is Yellow or Red  Were vital signs taken at a resting state? Yes  Focused Assessment Change from prior assessment (see assessment flowsheet)  Does the patient meet 2 or more of the SIRS criteria? No  MEWS guidelines implemented *See Row Information* Yes  Treat  MEWS Interventions Administered scheduled meds/treatments  Take Vital Signs  Increase Vital Sign Frequency  Yellow: Q 2hr X 2 then Q 4hr X 2, if remains yellow, continue Q 4hrs  Escalate  MEWS: Escalate Yellow: discuss with charge nurse/RN and consider discussing with provider and RRT  Notify: Charge Nurse/RN  Name of Charge Nurse/RN Notified Wilacynt RN  Date Charge Nurse/RN Notified 06/13/21  Time Charge Nurse/RN Notified 0158  Notify: Provider  Provider Name/Title Mansy  Date Provider Notified 06/13/21  Time Provider Notified 0202  Method of Notification Page  Notification Reason Other (Comment) (HR 46 and BP 99/71)  Provider response See new orders  Date of Provider Response 06/13/21  Time of Provider Response [id number]

## 2021-06-13 NOTE — Assessment & Plan Note (Addendum)
Continue Zoloft and Seroquel 

## 2021-06-13 NOTE — Plan of Care (Signed)

## 2021-06-13 NOTE — Progress Notes (Signed)
Patient Sisters's was given the stroke manuel and was educated on signs and symptoms, they stated they have a lot of theses symptoms in there brother

## 2021-06-13 NOTE — Progress Notes (Signed)
Progress Note    Jonathan Newman   ZOX:[id number]  DOB: 03/02/1960  DOA: 06/12/2021     1 PCP: Shane Crutch, PA  Initial CC: breakthru seizure  Hospital Course: Mr. 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  He is cared for by his sister who lives with him.  He also has a recent history of falls with associated increased frequency of seizure activity after falling. He was also recently hospitalized at [facility] for similar breakthrough seizure activity.  He underwent work-up including 24-hour EEG testing.  Etiology of his breakthrough seizures was considered due to a viral illness.  No changes in medications were made.  Upon returning home his sister states that he has had ongoing increased frequency of breakthrough seizures and therefore was brought to the hospital for further evaluation.  Interval History:  Seen this morning with sister bedside.  His mentation had resolved back to his normal baseline.  At baseline he is nonverbal and will sound out a few commands or words that are mostly unintelligible however his sister is able to understand him.  He has dense hemiparesis involving his right side from prior CVA.  He also passed bedside swallow evaluation easily and was restarted on a diet.  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 He was recently hospitalized at [facility] from 5/17 - 5/19 for similar; 24 hr EEG negative; there was report of 4 episodes diarrhea & nausea raising suspicion for viral illness contributing (no change in meds and supportive care otherwise recommended); of note was positive for THC on UDS as well (patient bedbound/WC bound with severe aphasia at baseline too, thus would be rather dependent on family help) - differential  includes low/normal depakote level causing breakthru possibly with superimposed infection still at play (sister does report some ongoing vomiting at home prior to admission) VS his typical frequency which may be evolving from his dense chronic CVA and/or from new CVA seen on recent MRI on 06/07/21 - at [facility] his valproic level was 75 on 06/06/21 and on admission here is 66 on 5/24 (sister noted patient did have some vomiting after meds at home recently) -Neurology also following as well, appreciate assistance - Continue Depakote at current dosing for now -will continue monitoring for any further evidence of systemic infection.  If recurrent diarrhea, will test  AKI (acute kidney injury) (HCC) - baseline creatinine ~ 0.9 - patient presents with increase in creat >0.3 mg/dL above baseline, creat increase >1.5x baseline presumed to have occurred within past 7 days PTA -Creatinine 1.57 on admission.  Etiology considered prerenal from GI losses - Continue fluids - BMP in a.m.   History of CVA (cerebrovascular accident) -History of large left MCA/PCA infarct - Residual right sided flaccid hemiparesis, right hemisensory loss, and global aphasia - also had repeat MRI brain at [facility] on 5/18 which showed "small recent infarction involving the right lentiform nucleus". Noted in d/c summary and was not felt to be contributing to his further absence seizures   Status post placement of implantable loop recorder - recently seen by cardiology on 03/21/21; interrogation showed no atrial fibrillation; 1 lifetime episode lasting 2 minutes.  He is noted to have ongoing bradycardia which is not new -He was continued on Eliquis for  treatment of prior right femoral/popliteal DVT (diagnosed 01/31/20)  Dyslipidemia - On Lipitor at home  Depression - Continue Zoloft and Seroquel  Hypokalemia - Replete as needed   Old records reviewed in assessment of this patient  Antimicrobials:   DVT prophylaxis:   apixaban  (ELIQUIS) tablet 5 mg   Code Status:   Code Status: Full Code  Disposition Plan: Home in 2 to 3 days Status is: Inpatient  Objective: Blood pressure 118/80, pulse (!) 48, temperature 98 F (36.7 C), temperature source Oral, resp. rate 16, weight 90.7 kg, SpO2 99 %.  Examination:  Physical Exam Constitutional:      Comments: Awake alert and is able to nod head appropriately and follow commands  HENT:     Head: Normocephalic and atraumatic.     Mouth/Throat:     Mouth: Mucous membranes are moist.  Eyes:     Extraocular Movements: Extraocular movements intact.     Pupils: Pupils are equal, round, and reactive to light.  Cardiovascular:     Rate and Rhythm: Normal rate and regular rhythm.  Pulmonary:     Effort: Pulmonary effort is normal.     Breath sounds: Normal breath sounds.  Abdominal:     General: Bowel sounds are normal. There is no distension.     Palpations: Abdomen is soft.     Tenderness: There is no abdominal tenderness.  Musculoskeletal:     Cervical back: Normal range of motion and neck supple.     Right lower leg: No edema.     Left lower leg: No edema.  Skin:    General: Skin is warm and dry.  Neurological:     Comments: Dense right-sided hemiparesis appreciated with loss of sensation as well.  4/5 strength left upper extremity and left lower extremity.  No left-sided paresthesia appreciated.  Psychiatric:        Behavior: Behavior normal.     Consultants:  Neurology  Procedures:    Data Reviewed: Results for orders placed or performed during the hospital encounter of 06/12/21 (from the past 24 hour(s))  CBC with Differential     Status: Abnormal   Collection Time: 06/12/21  7:54 PM  Result Value Ref Range   WBC 5.7 4.0 - 10.5 K/uL   RBC 3.99 (L) 4.22 - 5.81 MIL/uL   Hemoglobin 13.1 13.0 - 17.0 g/dL   HCT 16.139.1 09.639.0 - 04.552.0 %   MCV 98.0 80.0 - 100.0 fL   MCH 32.8 26.0 - 34.0 pg   MCHC 33.5 30.0 - 36.0 g/dL   RDW 40.913.7 81.111.5 - 91.415.5 %   Platelets  244 150 - 400 K/uL   nRBC 0.0 0.0 - 0.2 %   Neutrophils Relative % 51 %   Neutro Abs 2.9 1.7 - 7.7 K/uL   Lymphocytes Relative 37 %   Lymphs Abs 2.1 0.7 - 4.0 K/uL   Monocytes Relative 9 %   Monocytes Absolute 0.5 0.1 - 1.0 K/uL   Eosinophils Relative 1 %   Eosinophils Absolute 0.1 0.0 - 0.5 K/uL   Basophils Relative 1 %   Basophils Absolute 0.1 0.0 - 0.1 K/uL   Immature Granulocytes 1 %   Abs Immature Granulocytes 0.03 0.00 - 0.07 K/uL  Comprehensive metabolic panel     Status: Abnormal   Collection Time: 06/12/21  7:54 PM  Result Value Ref Range   Sodium 137 135 - 145 mmol/L   Potassium 3.3 (L) 3.5 - 5.1 mmol/L   Chloride 99 98 -  111 mmol/L   CO2 30 22 - 32 mmol/L   Glucose, Bld 94 70 - 99 mg/dL   BUN 45 (H) 6 - 20 mg/dL   Creatinine, Ser 2.37 (H) 0.61 - 1.24 mg/dL   Calcium 8.3 (L) 8.9 - 10.3 mg/dL   Total Protein 6.5 6.5 - 8.1 g/dL   Albumin 3.7 3.5 - 5.0 g/dL   AST 29 15 - 41 U/L   ALT 34 0 - 44 U/L   Alkaline Phosphatase 52 38 - 126 U/L   Total Bilirubin 0.7 0.3 - 1.2 mg/dL   GFR, Estimated 50 (L) >60 mL/min   Anion gap 8 5 - 15  Lipase, blood     Status: None   Collection Time: 06/12/21  7:54 PM  Result Value Ref Range   Lipase 30 11 - 51 U/L  Basic metabolic panel     Status: Abnormal   Collection Time: 06/13/21  5:16 AM  Result Value Ref Range   Sodium 138 135 - 145 mmol/L   Potassium 3.7 3.5 - 5.1 mmol/L   Chloride 101 98 - 111 mmol/L   CO2 30 22 - 32 mmol/L   Glucose, Bld 85 70 - 99 mg/dL   BUN 42 (H) 6 - 20 mg/dL   Creatinine, Ser 6.28 (H) 0.61 - 1.24 mg/dL   Calcium 8.1 (L) 8.9 - 10.3 mg/dL   GFR, Estimated >31 >51 mL/min   Anion gap 7 5 - 15  CBC     Status: Abnormal   Collection Time: 06/13/21  5:16 AM  Result Value Ref Range   WBC 6.4 4.0 - 10.5 K/uL   RBC 3.69 (L) 4.22 - 5.81 MIL/uL   Hemoglobin 12.4 (L) 13.0 - 17.0 g/dL   HCT 76.1 (L) 60.7 - 37.1 %   MCV 97.8 80.0 - 100.0 fL   MCH 33.6 26.0 - 34.0 pg   MCHC 34.3 30.0 - 36.0 g/dL   RDW 06.2  69.4 - 85.4 %   Platelets 220 150 - 400 K/uL   nRBC 0.0 0.0 - 0.2 %  HIV Antibody (routine testing w rflx)     Status: None   Collection Time: 06/13/21  5:16 AM  Result Value Ref Range   HIV Screen 4th Generation wRfx Non Reactive Non Reactive  Troponin I (High Sensitivity)     Status: None   Collection Time: 06/13/21  5:16 AM  Result Value Ref Range   Troponin I (High Sensitivity) 7 <18 ng/L  Magnesium     Status: None   Collection Time: 06/13/21  5:16 AM  Result Value Ref Range   Magnesium 2.2 1.7 - 2.4 mg/dL  Hemoglobin O2V     Status: None   Collection Time: 06/13/21  5:16 AM  Result Value Ref Range   Hgb A1c MFr Bld 5.5 4.8 - 5.6 %   Mean Plasma Glucose 111.15 mg/dL  Troponin I (High Sensitivity)     Status: None   Collection Time: 06/13/21  6:16 AM  Result Value Ref Range   Troponin I (High Sensitivity) 7 <18 ng/L  Valproic acid level     Status: None   Collection Time: 06/13/21 11:09 AM  Result Value Ref Range   Valproic Acid Lvl 66 50.0 - 100.0 ug/mL    I have Reviewed nursing notes, Vitals, and Lab results since pt's last encounter. Pertinent lab results : see above I have ordered test including BMP, CBC, Mg I have reviewed the last note from staff over past  24 hours I have discussed pt's care plan and test results with nursing staff, case manager   LOS: 1 day   Lewie Chamber, MD [facility] 06/13/2021, 6:14 PM

## 2021-06-13 NOTE — H&P (Signed)
[place]   PATIENT NAME: Jonathan Newman    MR#:  [id number]  DATE OF BIRTH:  1960/09/05  DATE OF ADMISSION:  06/12/2021  PRIMARY CARE PHYSICIAN: Shane Crutch, PA   Patient is coming from: Home  REQUESTING/REFERRING PHYSICIAN: Phineas Semen, MD  CHIEF COMPLAINT:   Chief Complaint  Patient presents with   Seizures    HISTORY OF PRESENT ILLNESS:  Jonathan Newman is a 61 y.o. male with medical history significant for hypertension, lipidemia, CVA and left-sided hemiparesis, who presented to the emergency room with acute onset of suspected seizure with subsequent fall.  The patient is nonverbal at baseline.  He was recently admitted to [facility] for frequent seizures that are mainly absent seizures and apparently had work-up including EEG that was negative.  On Saturday night he almost fell from his bed to the right side and was resting with his right shoulder on a nearby table.  Before he came to the ER he had an actual fall that was heard by his sister who lives with him.  In the ER he had a staring episode.  He was given Ativan and was very somnolent during my interview and difficult to arouse.  He has not been having much appetite since discharge.  He was having mild diarrhea on Friday for which she was given Imodium with no bowel movement since then.  His sister thinks he hit his head against a table.  He had nausea and vomiting once.  No chest pain no cough or wheezing.  He was noted to have right facial droop and drooling  ED Course: When he came to the ER vital signs revealed mild bradycardia of 52 and later 45 then 39.  Labs revealed a creatinine 1.57 with a BUN of 45 above previous normal levels.  CBC was within normal.    EKG as reviewed by me : Than 39 sinus bradycardia with rate 40 with prolonged PR interval and borderline low voltage. Imaging: Noncontrast head CT scan revealed old left MCA distribution infarct and old left basal ganglia lacunar infarct with small vessel  ischemic changes and no acute abnormalities.  Abdominal and pelvic CT scan revealed fluid-filled distended esophagus likely indicating reflux disease.  This may still 80 with no obstructing lesions identified and no evidence for bowel obstruction or inflammation.  It showed aortic atherosclerosis.  The patient was given 40 mg IV Protonix and 40 mill equivalent p.o. potassium chloride as well as 1 and half liter bolus of IV normal saline.  He will be admitted to a medical bed for further evaluation and management. PAST MEDICAL HISTORY:   Past Medical History:  Diagnosis Date   Constipation    CVA (cerebral vascular accident) (HCC)    Diarrhea    Dysarthria and anarthria    Hemiplegia and hemiparesis following cerebral infarction affecting right dominant side (HCC)    Hyperlipidemia    Pain in right leg     PAST SURGICAL HISTORY:  No past surgical history on file.  No reported previous surgeries.  SOCIAL HISTORY:   Social History   Tobacco Use   Smoking status: Former   Smokeless tobacco: Never  Substance Use Topics   Alcohol use: Not Currently    FAMILY HISTORY:  No family history on file.  No reported family history.  DRUG ALLERGIES:  No Known Allergies  REVIEW OF SYSTEMS:   ROS As per history of present illness. All pertinent systems were reviewed above. Constitutional, HEENT, cardiovascular, respiratory,  GI, GU, musculoskeletal, neuro, psychiatric, endocrine, integumentary and hematologic systems were reviewed and are otherwise negative/unremarkable except for positive findings mentioned above in the HPI.   MEDICATIONS AT HOME:   Prior to Admission medications   Medication Sig Start Date End Date Taking? Authorizing Provider  ACETAMINOPHEN PO Take 650 mg by mouth every 6 (six) hours as needed.   Yes [provider]  atorvastatin (LIPITOR) 40 MG tablet Take 40 mg by mouth at bedtime.   Yes [provider]  divalproex (DEPAKOTE) 500 MG DR tablet Take  500 mg by mouth 2 (two) times daily. 05/16/21  Yes [provider]  ELIQUIS 5 MG TABS tablet Take 5 mg by mouth 2 (two) times daily. 09/04/20  Yes [provider]  fluticasone (FLONASE) 50 MCG/ACT nasal spray Place 1 spray into both nostrils daily. 03/16/21  Yes [provider]  hydrochlorothiazide (HYDRODIURIL) 12.5 MG tablet Take 12.5 mg by mouth daily. 05/16/21  Yes [provider]  lisinopril (ZESTRIL) 20 MG tablet Take 20 mg by mouth daily. 06/01/20  Yes [provider]  loratadine (CLARITIN) 10 MG tablet Take 10 mg by mouth daily. 06/09/20  Yes [provider]  mupirocin ointment (BACTROBAN) 2 % Apply 1 application. topically 3 (three) times daily. 05/28/21  Yes [provider]  ondansetron (ZOFRAN) 4 MG tablet Take 4 mg by mouth every 8 (eight) hours as needed for nausea or vomiting.   Yes [provider]  QUEtiapine (SEROQUEL) 25 MG tablet Take 25 mg by mouth at bedtime. 05/17/21  Yes [provider]  sertraline (ZOLOFT) 25 MG tablet Take 25 mg by mouth daily. 05/16/21  Yes [provider]  tamsulosin (FLOMAX) 0.4 MG CAPS capsule Take 0.4 mg by mouth daily. 04/15/21  Yes [provider]  doxycycline (VIBRA-TABS) 100 MG tablet Take 100 mg by mouth 2 (two) times daily. Patient not taking: Reported on 06/12/2021 05/28/21   [provider]  loperamide (IMODIUM) 2 MG capsule Take 2 mg by mouth as needed for diarrhea or loose stools.    [provider]  PARoxetine (PAXIL) 10 MG tablet Take 10 mg by mouth daily. Patient not taking: Reported on 06/12/2021 06/09/20   [provider]      VITAL SIGNS:  Blood pressure 116/79, pulse (!) 51, temperature 98 F (36.7 C), temperature source Oral, resp. rate 17, weight 90.7 kg, SpO2 100 %.  PHYSICAL EXAMINATION:  Physical Exam  GENERAL:  61 y.o.-year-old Caucasian male patient lying in the bed with no acute distress.  He is very somnolent  and difficult to arouse. EYES: Pupils equal, round, reactive to light and accommodation. No scleral icterus. Extraocular muscles intact.  HEENT: Head atraumatic, normocephalic. Oropharynx and nasopharynx clear.  NECK:  Supple, no jugular venous distention. No thyroid enlargement, no tenderness.  LUNGS: Normal breath sounds bilaterally, no wheezing, rales,rhonchi or crepitation. No use of accessory muscles of respiration.  CARDIOVASCULAR: Regular rate and rhythm, S1, S2 normal. No murmurs, rubs, or gallops.  ABDOMEN: Soft, nondistended, nontender. Bowel sounds present. No organomegaly or mass.  EXTREMITIES: No pedal edema, cyanosis, or clubbing.  NEUROLOGIC: Cranial nerves II through XII are intact. Muscle strength 5/5 in all extremities. Sensation intact. Gait not checked.  PSYCHIATRIC: He is very somnolent and difficult to arouse. SKIN: No obvious rash, lesion, or ulcer.   LABORATORY PANEL:   CBC Recent Labs  Lab 06/13/21 0516  WBC 6.4  HGB 12.4*  HCT 36.1*  PLT 220   ------------------------------------------------------------------------------------------------------------------  Chemistries  Recent Labs  Lab 06/12/21 1954 06/13/21 0516  NA 137 138  K 3.3* 3.7  CL 99 101  CO2 30 30  GLUCOSE 94 85  BUN 45* 42*  CREATININE 1.57* 1.31*  CALCIUM 8.3* 8.1*  MG  --  2.2  AST 29  --   ALT 34  --   ALKPHOS 52  --   BILITOT 0.7  --    ------------------------------------------------------------------------------------------------------------------  Cardiac Enzymes No results for input(s): TROPONINI in the last 168 hours. ------------------------------------------------------------------------------------------------------------------  RADIOLOGY:  CT Head Wo Contrast  Result Date: 06/12/2021 CLINICAL DATA:  Seizure EXAM: CT HEAD WITHOUT CONTRAST TECHNIQUE: Contiguous axial images were obtained from the base of the skull through the vertex without intravenous contrast.  RADIATION DOSE REDUCTION: This exam was performed according to the departmental dose-optimization program which includes automated exposure control, adjustment of the mA and/or kV according to patient size and/or use of iterative reconstruction technique. COMPARISON:  10/02/2020 FINDINGS: Brain: No evidence of acute infarction, hemorrhage, hydrocephalus, extra-axial collection or mass lesion/mass effect. Encephalomalacic changes related to old left MCA distribution infarct. Old left basal ganglia lacunar infarct. Subcortical white matter and periventricular small vessel ischemic changes. Vascular: No hyperdense vessel or unexpected calcification. Skull: Normal. Negative for fracture or focal lesion. Sinuses/Orbits: The visualized paranasal sinuses are essentially clear. The mastoid air cells are unopacified. Other: None. IMPRESSION: No acute intracranial abnormality. 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 Old left basal ganglia lacunar infarct. Small vessel ischemic changes. Electronically Signed   By: Charline BillsSriyesh  Krishnan M.D.   On: 06/12/2021 22:45   CT ABDOMEN PELVIS W CONTRAST  Result Date: 06/12/2021 CLINICAL DATA:  Nausea and vomiting. Recent hospitalization for epilepsy. Bradycardia. EXAM: CT ABDOMEN AND PELVIS WITH CONTRAST TECHNIQUE: Multidetector CT imaging of the abdomen and pelvis was performed using the standard protocol following bolus administration of intravenous contrast. RADIATION DOSE REDUCTION: This exam was performed according to the departmental dose-optimization program which includes automated exposure control, adjustment of the mA and/or kV according to patient size and/or use of iterative reconstruction technique. CONTRAST:  100mL OMNIPAQUE IOHEXOL 300 MG/ML  SOLN COMPARISON:  06/06/2020 FINDINGS: Lower chest: Atelectasis in the lung bases. Fluid-filled mildly distended esophagus, likely indicating reflux disease or dysmotility. Hepatobiliary: No focal liver abnormality is seen. No gallstones,  gallbladder wall thickening, or biliary dilatation. Pancreas: Unremarkable. No pancreatic ductal dilatation or surrounding inflammatory changes. Spleen: Normal in size without focal abnormality. Adrenals/Urinary Tract: No adrenal gland nodules. Kidneys are symmetrical. No hydronephrosis or hydroureter. No solid mass lesions. Bladder is normal. Stomach/Bowel: Stomach, small bowel, and colon are not abnormally distended. No wall thickening or inflammatory changes. Appendix is normal. Vascular/Lymphatic: Scattered calcification of the aorta. No aneurysm. Inferior vena caval filter is present. Reproductive: Prostate gland is mildly enlarged. Other: No abdominal wall hernia or abnormality. No abdominopelvic ascites. Musculoskeletal: Degenerative changes in the spine. No destructive bone lesions. IMPRESSION: 1. Fluid-filled distended esophagus likely indicating reflux disease or dysmotility. No obstructing lesion is identified. 2. No evidence of bowel obstruction or inflammation. 3. Aortic atherosclerosis. Electronically Signed   By: Burman NievesWilliam  Stevens M.D.   On: 06/12/2021 22:48      IMPRESSION AND PLAN:  Assessment and Plan: * Seizure [facility](HCC) - The patient will be admitted to a medical telemetry bed. - Seizure precautions will be provided. - The patient be placed on as needed IV Ativan. - Neurology consult will be obtained. - I notified Dr. Selina CooleyStack about the patient  Hypokalemia - Potassium will be replaced and  magnesium level will be checked  AKI (acute kidney injury) (HCC) - The patient will be hydrated with IV normal saline and will follow BMP  Dyslipidemia - We will continue statin therapy.  Depression - We will continue SSRI and Seroquel therapy.  Stroke [facility]) - The patient has residual left-sided hemiparesis. - We will follow neurochecks every 4 hours for 24 hours. - We will obtain a brain MRI without contrast for further assessment for recurrent CVA.   DVT prophylaxis: Lovenox.  Advanced  Care Planning:  Code Status: full code.  Family Communication:  The plan of care was discussed in details with the patient (and family). I answered all questions. The patient agreed to proceed with the above mentioned plan. Further management will depend upon hospital course. Disposition Plan: Back to previous home environment Consults called: Neurology all the records are reviewed and case discussed with ED provider.  Status is: Inpatient  At the time of the admission, it appears that the appropriate admission status for this patient is inpatient.  This is judged to be reasonable and necessary in order to provide the required intensity of service to ensure the patient's safety given the presenting symptoms, physical exam findings and initial radiographic and laboratory data in the context of comorbid conditions.  The patient requires inpatient status due to high intensity of service, high risk of further deterioration and high frequency of surveillance required.  I certify that at the time of admission, it is my clinical judgment that the patient will require inpatient hospital care extending more than 2 midnights.                            Dispo: The patient is from: Home              Anticipated d/c is to: Home              Patient currently is not medically stable to d/c.              Difficult to place patient: No  Hannah Beat M.D on 06/13/2021 at 7:49 AM  [facility]   From 7 PM-7 AM, contact night-coverage [email or web address]  CC: Primary care physician; Shane Crutch, PA

## 2021-06-14 DIAGNOSIS — R569 Unspecified convulsions: Secondary | ICD-10-CM | POA: Diagnosis not present

## 2021-06-14 DIAGNOSIS — N179 Acute kidney failure, unspecified: Secondary | ICD-10-CM | POA: Diagnosis not present

## 2021-06-14 LAB — COMPREHENSIVE METABOLIC PANEL
ALT: 23 U/L (ref 0–44)
AST: 26 U/L (ref 15–41)
Albumin: 3.3 g/dL — ABNORMAL LOW (ref 3.5–5.0)
Alkaline Phosphatase: 46 U/L (ref 38–126)
Anion gap: 6 (ref 5–15)
BUN: 26 mg/dL — ABNORMAL HIGH (ref 6–20)
CO2: 27 mmol/L (ref 22–32)
Calcium: 8.2 mg/dL — ABNORMAL LOW (ref 8.9–10.3)
Chloride: 106 mmol/L (ref 98–111)
Creatinine, Ser: 0.93 mg/dL (ref 0.61–1.24)
GFR, Estimated: >60 mL/min (ref >60)
Glucose, Bld: 76 mg/dL (ref 70–99)
Potassium: 3.7 mmol/L (ref 3.5–5.1)
Sodium: 139 mmol/L (ref 135–145)
Total Bilirubin: 0.9 mg/dL (ref 0.3–1.2)
Total Protein: 5.9 g/dL — ABNORMAL LOW (ref 6.5–8.1)

## 2021-06-14 LAB — MAGNESIUM: Magnesium: 2.1 mg/dL (ref 1.7–2.4)

## 2021-06-14 LAB — CBC WITH DIFFERENTIAL/PLATELET
Abs Immature Granulocytes: 0.01 10*3/uL (ref 0.00–0.07)
Basophils Absolute: 0 10*3/uL (ref 0.0–0.1)
Basophils Relative: 1 %
Eosinophils Absolute: 0.1 10*3/uL (ref 0.0–0.5)
Eosinophils Relative: 1 %
HCT: 37.8 % — ABNORMAL LOW (ref 39.0–52.0)
Hemoglobin: 12.7 g/dL — ABNORMAL LOW (ref 13.0–17.0)
Immature Granulocytes: 0 %
Lymphocytes Relative: 49 %
Lymphs Abs: 2.6 10*3/uL (ref 0.7–4.0)
MCH: 32.8 pg (ref 26.0–34.0)
MCHC: 33.6 g/dL (ref 30.0–36.0)
MCV: 97.7 fL (ref 80.0–100.0)
Monocytes Absolute: 0.3 10*3/uL (ref 0.1–1.0)
Monocytes Relative: 6 %
Neutro Abs: 2.3 10*3/uL (ref 1.7–7.7)
Neutrophils Relative %: 43 %
Platelets: 204 10*3/uL (ref 150–400)
RBC: 3.87 MIL/uL — ABNORMAL LOW (ref 4.22–5.81)
RDW: 13.9 % (ref 11.5–15.5)
WBC: 5.3 10*3/uL (ref 4.0–10.5)
nRBC: 0 % (ref 0.0–0.2)

## 2021-06-14 LAB — LIPID PANEL
Cholesterol: 82 mg/dL (ref 0–200)
HDL: 39 mg/dL — ABNORMAL LOW (ref >40)
LDL Cholesterol: 22 mg/dL (ref 0–99)
Total CHOL/HDL Ratio: 2.1 RATIO
Triglycerides: 106 mg/dL (ref <150)
VLDL: 21 mg/dL (ref 0–40)

## 2021-06-14 NOTE — Consult Note (Signed)
NEUROLOGY CONSULTATION NOTE   Date of service: Jun 14, 2021 Patient Name: Jonathan Newman MRN:  [id number] DOB:  December 14, 1960 Reason for consult: breakthrough seizure Requesting physician: Dr. Lewie Chamber _ _ _   _ __   _ __ _ _  __ __   _ __   __ _  History of Present Illness   This is a 61 yo gentleman with pmhx large L MCA stroke with reidual severe expressive aphasia and R hemiplegia and secondary seizure disorder admitted after breakthrough seizure. Hx obtained from sister at bedside. Prior to the past few weeks patient had approx 1 seizure per month described as staring spell. He is on VPA DR 500mg  q 12 hrs outpatient. He was admitted to [facility] one week ago for breakthrough seizure in the setting of diarrheal illness. His AEDs were not changed at that time. Yesterday sister heard a large thud and found him in his room next to his bed with eyes rolled back in his head unresponsive. When EMS arrived per sister report patient had SBP 70 and HR approx 40. He was brought in for further evaluation. VPA level 66. Head CT NAICP (personal review).   ROS   UTA 2/2 aphasia  Past History   I have reviewed the following:  Past Medical History:  Diagnosis Date   Constipation    CVA (cerebral vascular accident) (HCC)    Diarrhea    Dysarthria and anarthria    Hemiplegia and hemiparesis following cerebral infarction affecting right dominant side (HCC)    Hyperlipidemia    Pain in right leg    No past surgical history on file. No family history on file. Social History   Socioeconomic History   Marital status: Single    Spouse name: Not on file   Number of children: Not on file   Years of education: Not on file   Highest education level: Not on file  Occupational History   Not on file  Tobacco Use   Smoking status: Former   Smokeless tobacco: Never  Substance and Sexual Activity   Alcohol use: Not Currently   Drug use: Not Currently   Sexual activity: Not on file  Other Topics Concern    Not on file  Social History Narrative   Not on file   Social Determinants of Health   Financial Resource Strain: Not on file  Food Insecurity: Not on file  Transportation Needs: Not on file  Physical Activity: Not on file  Stress: Not on file  Social Connections: Not on file   No Known Allergies  Medications   Medications Prior to Admission  Medication Sig Dispense Refill Last Dose   ACETAMINOPHEN PO Take 650 mg by mouth every 6 (six) hours as needed.   prn at prn   atorvastatin (LIPITOR) 40 MG tablet Take 40 mg by mouth at bedtime.   06/12/2021 at 2000   divalproex (DEPAKOTE) 500 MG DR tablet Take 500 mg by mouth 2 (two) times daily.   06/12/2021 at 1000   ELIQUIS 5 MG TABS tablet Take 5 mg by mouth 2 (two) times daily.   06/12/2021 at 2000   fluticasone (FLONASE) 50 MCG/ACT nasal spray Place 1 spray into both nostrils daily.   06/12/2021 at 0800   hydrochlorothiazide (HYDRODIURIL) 12.5 MG tablet Take 12.5 mg by mouth daily.   06/12/2021 at 0800   lisinopril (ZESTRIL) 20 MG tablet Take 20 mg by mouth daily.   06/12/2021 at 0800   loratadine (CLARITIN) 10 MG  tablet Take 10 mg by mouth daily.   06/12/2021 at 0800   mupirocin ointment (BACTROBAN) 2 % Apply 1 application. topically 3 (three) times daily.   06/12/2021 at 2000   ondansetron (ZOFRAN) 4 MG tablet Take 4 mg by mouth every 8 (eight) hours as needed for nausea or vomiting.   06/12/2021   QUEtiapine (SEROQUEL) 25 MG tablet Take 25 mg by mouth at bedtime.   06/12/2021 at 2000   sertraline (ZOLOFT) 25 MG tablet Take 25 mg by mouth daily.   06/12/2021 at 0800   tamsulosin (FLOMAX) 0.4 MG CAPS capsule Take 0.4 mg by mouth daily.   06/12/2021 at 0800   doxycycline (VIBRA-TABS) 100 MG tablet Take 100 mg by mouth 2 (two) times daily. (Patient not taking: Reported on 06/12/2021)   Completed Course   loperamide (IMODIUM) 2 MG capsule Take 2 mg by mouth as needed for diarrhea or loose stools.      PARoxetine (PAXIL) 10 MG tablet Take 10 mg by  mouth daily. (Patient not taking: Reported on 06/12/2021)   Not Taking      Current Facility-Administered Medications:    0.9 %  sodium chloride infusion, , Intravenous, Continuous, Mansy, Jan A, MD, Last Rate: 100 mL/hr at 06/13/21 1630, New Bag at 06/13/21 1630   acetaminophen (TYLENOL) tablet 650 mg, 650 mg, Oral, Q6H PRN, 650 mg at 06/13/21 1921 **OR** acetaminophen (TYLENOL) suppository 650 mg, 650 mg, Rectal, Q6H PRN, Mansy, Jan A, MD   apixaban (ELIQUIS) tablet 5 mg, 5 mg, Oral, BID, Lewie ChamberGirguis, David, MD, 5 mg at 06/13/21 2310   divalproex (DEPAKOTE) DR tablet 500 mg, 500 mg, Oral, BID, Lewie ChamberGirguis, David, MD, 500 mg at 06/13/21 2311   LORazepam (ATIVAN) injection 1 mg, 1 mg, Intravenous, Q1H PRN, Mansy, Jan A, MD   magnesium hydroxide (MILK OF MAGNESIA) suspension 30 mL, 30 mL, Oral, Daily PRN, Mansy, Jan A, MD   ondansetron (ZOFRAN) tablet 4 mg, 4 mg, Oral, Q6H PRN **OR** ondansetron (ZOFRAN) injection 4 mg, 4 mg, Intravenous, Q6H PRN, Mansy, Jan A, MD, 4 mg at 06/13/21 1622   QUEtiapine (SEROQUEL) tablet 25 mg, 25 mg, Oral, QHS, Lewie ChamberGirguis, David, MD, 25 mg at 06/13/21 2310   sertraline (ZOLOFT) tablet 25 mg, 25 mg, Oral, Daily, Lewie ChamberGirguis, David, MD, 25 mg at 06/13/21 1054   tamsulosin (FLOMAX) capsule 0.4 mg, 0.4 mg, Oral, Daily, Lewie ChamberGirguis, David, MD   traZODone (DESYREL) tablet 25 mg, 25 mg, Oral, QHS PRN, Mansy, Jan A, MD  Vitals   Vitals:   06/13/21 2127 06/14/21 0001 06/14/21 0416 06/14/21 0538  BP: 117/68 110/76 115/81 129/72  Pulse: (!) 47 (!) 58 (!) 46 (!) 47  Resp: 17 17 12 18   Temp: 97.6 F (36.4 C) 98.1 F (36.7 C) 97.8 F (36.6 C) 98 F (36.7 C)  TempSrc:   Axillary   SpO2: 95% 97% 99% 99%  Weight:         Body mass index is 30.41 kg/m.  Physical Exam   Physical Exam Gen: arousable, able to follow commands but not able to answer orientation questions HEENT: Atraumatic, normocephalic;mucous membranes moist; oropharynx clear, tongue without atrophy or  fasciculations. Neck: Supple, trachea midline. Resp: CTAB, no w/r/r CV: RRR, no m/g/r; nml S1 and S2. 2+ symmetric peripheral pulses. Abd: soft/NT/ND; nabs x 4 quad Extrem: Nml bulk; no cyanosis, clubbing, or edema.  Neuro: *MS: arousable, able to follow commands but not able to answer orientation questions *Speech: severe expressive aphasia; no intelligible speech *CN:  PERRL, blinks to threat bilat, EOMI, sensation intact, face symmetric, hearing intact to voice *Motor:   Normal bulk.  LUE and LLE 5/5. RUE and RLE 0/5. *Sensory: No withdrawal to noxious stimuli RUE and RLE *Coordination:  UTA *Reflexes:  2+ more brisk on R, toes down L and mute R *Gait: deferred  Labs   CBC:  Recent Labs  Lab 06/12/21 1954 06/13/21 0516 06/14/21 0355  WBC 5.7 6.4 5.3  NEUTROABS 2.9  --  2.3  HGB 13.1 12.4* 12.7*  HCT 39.1 36.1* 37.8*  MCV 98.0 97.8 97.7  PLT 244 220 204    Basic Metabolic Panel:  Lab Results  Component Value Date   NA 139 06/14/2021   K 3.7 06/14/2021   CO2 27 06/14/2021   GLUCOSE 76 06/14/2021   BUN 26 (H) 06/14/2021   CREATININE 0.93 06/14/2021   CALCIUM 8.2 (L) 06/14/2021   GFRNONAA >60 06/14/2021   Lipid Panel:  Lab Results  Component Value Date   LDLCALC 22 06/14/2021   HgbA1c:  Lab Results  Component Value Date   HGBA1C 5.5 06/13/2021   Urine Drug Screen: No results found for: LABOPIA, COCAINSCRNUR, LABBENZ, AMPHETMU, THCU, LABBARB  Alcohol Level No results found for: ETH    Impression   This is a 61 yo gentleman with pmhx large L MCA stroke with reidual severe expressive aphasia and R hemiplegia and secondary seizure disorder admitted after breakthrough seizure. I am concerned that some systemic process is precipitating his breakthrough seizures given his reported severe hypotension and bradycardia during the event yesterday as well as diarrhea and n/v over the past 2 weeks. He is therapeutic on his VPA despite recent episodes of vomiting  therefore I do not wish to increase it 2/2 concern that when he stops vomiting his level will actually probably be higher than it is now.   Recommendations   Recommend MRI brain wo contrast Further medical workup r/o precipitating factors Will continue to follow ______________________________________________________________________   Thank you for the opportunity to take part in the care of this patient. If you have any further questions, please contact the neurology consultation attending.  Signed,  Bing Neighbors, MD [facility] [phone]  If 7pm- 7am, please page neurology on call as listed in [facility]ION.

## 2021-06-14 NOTE — Progress Notes (Signed)
   06/14/21 0416 06/14/21 0538  Assess: MEWS Score  Temp 97.8 F (36.6 C) 98 F (36.7 C)  BP 115/81 129/72  Pulse Rate (!) 46 (!) 47  Resp 12 18  Level of Consciousness Alert  --   SpO2 99 % 99 %  O2 Device Room Air Room Air  Assess: MEWS Score  MEWS Temp 0 0  MEWS Systolic 0 0  MEWS Pulse 1 1  MEWS RR 1 0  MEWS LOC 0 0  MEWS Score 2 1  MEWS Score Color Yellow Green  Assess: SIRS CRITERIA  SIRS Temperature  0 0  SIRS Pulse 0 0  SIRS Respirations  0 0  SIRS WBC 0 0  SIRS Score Sum  0 0

## 2021-06-14 NOTE — Plan of Care (Signed)

## 2021-06-14 NOTE — Discharge Summary (Signed)
Physician Discharge Summary   Jalontae Bolan [id number] DOB: 22-May-1960 DOA: 06/12/2021  PCP: Ranae Plumber, PA  Admit date: 06/12/2021 Discharge date: 06/14/2021   Admitted From: Home Disposition:  Home Discharging physician: Dwyane Dee, MD  Recommendations for Outpatient Follow-up:  Follow up with neurology   Home Health:  Equipment/Devices:   Discharge Condition: stable CODE STATUS: Full Diet recommendation:  Diet Orders (From admission, onward)     Start     Ordered   06/14/21 0000  Diet general        06/14/21 1211   06/13/21 0952  Diet regular Room service appropriate? Yes; Fluid consistency: Thin  Diet effective now       Question Answer Comment  Room service appropriate? Yes   Fluid consistency: Thin      06/13/21 0951            Hospital Course: Mr. 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  He is cared for by his sister who lives with him.  He also has a recent history of falls with associated increased frequency of seizure activity after falling. He was also recently hospitalized at [facility] for similar breakthrough seizure activity.  He underwent work-up including 24-hour EEG testing.  Etiology of his breakthrough seizures was considered due to a viral illness.  No changes in medications were made.  Upon returning home his sister states that he has had ongoing increased frequency of breakthrough seizures and therefore was brought to the hospital for further evaluation.  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 He was recently hospitalized at [facility] from 5/17 - 5/19 for similar; 24 hr EEG negative; there was report of 4 episodes diarrhea & nausea raising suspicion for viral illness contributing (no change  in meds and supportive care otherwise recommended); of note was positive for THC on UDS as well (patient bedbound/WC bound with severe aphasia at baseline too, thus would be rather dependent on family help) - differential includes low/normal depakote level causing breakthru possibly with superimposed infection still at play (sister does report some ongoing vomiting at home prior to admission) VS his typical frequency which may be evolving from his dense chronic CVA and/or from new CVA seen on recent MRI on 06/07/21 -Depakote level on admission lower end of normal range -Patient also evaluated by neurology during hospitalization - Continue Depakote at current dosing for now -Obvious infection ruled out and negative stool studies in setting of his diarrhea complaints prior to admission; work-up was also negative for diarrhea recently at [facility]  AKI (acute kidney injury) ([place]) - baseline creatinine ~ 0.9 - patient presents with increase in creat >0.3 mg/dL above baseline, creat increase >1.5x baseline presumed to have occurred within past 7 days PTA -Creatinine 1.57 on admission.  Etiology considered prerenal from GI losses -Creatinine improved with fluids   History of CVA (cerebrovascular accident) -History of large left MCA/PCA infarct - Residual right sided flaccid hemiparesis, right hemisensory loss, and global aphasia - also had repeat MRI brain at [facility] on 5/18 which showed "small recent infarction involving the right lentiform nucleus". Noted in d/c summary and was not felt to be contributing to his further absence seizures   Status post placement of implantable loop recorder - recently seen by  cardiology on 03/21/21; interrogation showed no atrial fibrillation; 1 lifetime episode lasting 2 minutes.  He is noted to have ongoing bradycardia which is not new -He was continued on Eliquis for treatment of prior right femoral/popliteal DVT (diagnosed 01/31/20)  Dyslipidemia - On Lipitor at  home  Depression - Continue Zoloft and Seroquel  Hypokalemia - Repleted   The patient's chronic medical conditions were treated accordingly per the patient's home medication regimen except as noted.  On day of discharge, patient was felt deemed stable for discharge. Patient/family member advised to call PCP or come back to ER if needed.   Principal Diagnosis: Seizure [facility])  Discharge Diagnoses: Principal Problem:   Seizure ([place]) Active Problems:   AKI (acute kidney injury) ([place])   History of CVA (cerebrovascular accident)   Status post placement of implantable loop recorder   Hypokalemia   Depression   Dyslipidemia   Discharge Instructions     Diet general   Complete by: As directed    Increase activity slowly   Complete by: As directed       Allergies as of 06/14/2021   No Known Allergies      Medication List     STOP taking these medications    doxycycline 100 MG tablet Commonly known as: VIBRA-TABS   hydrochlorothiazide 12.5 MG tablet Commonly known as: HYDRODIURIL   PARoxetine 10 MG tablet Commonly known as: PAXIL       TAKE these medications    ACETAMINOPHEN PO Take 650 mg by mouth every 6 (six) hours as needed.   atorvastatin 40 MG tablet Commonly known as: LIPITOR Take 40 mg by mouth at bedtime.   divalproex 500 MG DR tablet Commonly known as: DEPAKOTE Take 500 mg by mouth 2 (two) times daily.   Eliquis 5 MG Tabs tablet Generic drug: apixaban Take 5 mg by mouth 2 (two) times daily.   fluticasone 50 MCG/ACT nasal spray Commonly known as: FLONASE Place 1 spray into both nostrils daily.   lisinopril 20 MG tablet Commonly known as: ZESTRIL Take 20 mg by mouth daily.   loperamide 2 MG capsule Commonly known as: IMODIUM Take 2 mg by mouth as needed for diarrhea or loose stools.   loratadine 10 MG tablet Commonly known as: CLARITIN Take 10 mg by mouth daily.   mupirocin ointment 2 % Commonly known as: BACTROBAN Apply 1  application. topically 3 (three) times daily.   ondansetron 4 MG tablet Commonly known as: ZOFRAN Take 4 mg by mouth every 8 (eight) hours as needed for nausea or vomiting.   QUEtiapine 25 MG tablet Commonly known as: SEROQUEL Take 25 mg by mouth at bedtime.   sertraline 25 MG tablet Commonly known as: ZOLOFT Take 25 mg by mouth daily.   tamsulosin 0.4 MG Caps capsule Commonly known as: FLOMAX Take 0.4 mg by mouth daily.        Follow-up Information     Vladimir Crofts, MD. Schedule an appointment as soon as possible for a visit in 2 week(s).   Specialty: Neurology Contact information: [facility] [place] [address] [phone]         Ranae Plumber, [place]. Schedule an appointment as soon as possible for a visit in 2 week(s).   Specialty: Family Medicine Contact information: [place] [address] [phone]                No Known Allergies  Consultations: Neurology  Procedures:   Discharge  Exam: BP 122/81 (BP Location: Left Arm)   Pulse (!) 55   Temp 97.8 F (36.6 C)   Resp 18   Wt 90.7 kg   SpO2 99%   BMI 30.41 kg/m  Physical Exam Constitutional:      Comments: Awake alert and is able to nod head appropriately and follow commands  HENT:     Head: Normocephalic and atraumatic.     Mouth/Throat:     Mouth: Mucous membranes are moist.  Eyes:     Extraocular Movements: Extraocular movements intact.     Pupils: Pupils are equal, round, and reactive to light.  Cardiovascular:     Rate and Rhythm: Normal rate and regular rhythm.  Pulmonary:     Effort: Pulmonary effort is normal.     Breath sounds: Normal breath sounds.  Abdominal:     General: Bowel sounds are normal. There is no distension.     Palpations: Abdomen is soft.     Tenderness: There is no abdominal tenderness.  Musculoskeletal:     Cervical back: Normal range of motion and neck supple.     Right lower leg: No  edema.     Left lower leg: No edema.  Skin:    General: Skin is warm and dry.  Neurological:     Comments: Dense right-sided hemiparesis appreciated with loss of sensation as well.  4/5 strength left upper extremity and left lower extremity.  No left-sided paresthesia appreciated.  Psychiatric:        Behavior: Behavior normal.     The results of significant diagnostics from this hospitalization (including imaging, microbiology, ancillary and laboratory) are listed below for reference.   Microbiology: Recent Results (from the past 240 hour(s))  C Difficile Quick Screen w PCR reflex     Status: None   Collection Time: 06/13/21  8:20 PM   Specimen: STOOL  Result Value Ref Range Status   C Diff antigen NEGATIVE NEGATIVE Final   C Diff toxin NEGATIVE NEGATIVE Final   C Diff interpretation No C. difficile detected.  Final    Comment: Performed at [facility], [address] [place], [place] [address]  Gastrointestinal Panel by PCR , Stool     Status: None   Collection Time: 06/13/21  8:20 PM   Specimen: Stool  Result Value Ref Range Status   Campylobacter species NOT DETECTED NOT DETECTED Final   Plesimonas shigelloides NOT DETECTED NOT DETECTED Final   Salmonella species NOT DETECTED NOT DETECTED Final   Yersinia enterocolitica NOT DETECTED NOT DETECTED Final   Vibrio species NOT DETECTED NOT DETECTED Final   Vibrio cholerae NOT DETECTED NOT DETECTED Final   Enteroaggregative E coli (EAEC) NOT DETECTED NOT DETECTED Final   Enteropathogenic E coli (EPEC) NOT DETECTED NOT DETECTED Final   Enterotoxigenic E coli (ETEC) NOT DETECTED NOT DETECTED Final   Shiga like toxin producing E coli (STEC) NOT DETECTED NOT DETECTED Final   Shigella/Enteroinvasive E coli (EIEC) NOT DETECTED NOT DETECTED Final   Cryptosporidium NOT DETECTED NOT DETECTED Final   Cyclospora cayetanensis NOT DETECTED NOT DETECTED Final   Entamoeba histolytica NOT DETECTED NOT DETECTED Final   Giardia lamblia  NOT DETECTED NOT DETECTED Final   Adenovirus F40/41 NOT DETECTED NOT DETECTED Final   Astrovirus NOT DETECTED NOT DETECTED Final   Norovirus GI/GII NOT DETECTED NOT DETECTED Final   Rotavirus A NOT DETECTED NOT DETECTED Final   Sapovirus (I, II, IV, and V) NOT DETECTED NOT DETECTED Final  Comment: Performed at [facility], [address] [place], [place] [address]     Labs: BNP (last 3 results) No results for input(s): BNP in the last 8760 hours. Basic Metabolic Panel: Recent Labs  Lab 06/12/21 1954 06/13/21 0516 06/14/21 0355  NA 137 138 139  K 3.3* 3.7 3.7  CL 99 101 106  CO2 30 30 27   GLUCOSE 94 85 76  BUN 45* 42* 26*  CREATININE 1.57* 1.31* 0.93  CALCIUM 8.3* 8.1* 8.2*  MG  --  2.2 2.1   Liver Function Tests: Recent Labs  Lab 06/12/21 1954 06/14/21 0355  AST 29 26  ALT 34 23  ALKPHOS 52 46  BILITOT 0.7 0.9  PROT 6.5 5.9*  ALBUMIN 3.7 3.3*   Recent Labs  Lab 06/12/21 1954  LIPASE 30   No results for input(s): AMMONIA in the last 168 hours. CBC: Recent Labs  Lab 06/12/21 1954 06/13/21 0516 06/14/21 0355  WBC 5.7 6.4 5.3  NEUTROABS 2.9  --  2.3  HGB 13.1 12.4* 12.7*  HCT 39.1 36.1* 37.8*  MCV 98.0 97.8 97.7  PLT 244 220 204   Cardiac Enzymes: No results for input(s): CKTOTAL, CKMB, CKMBINDEX, TROPONINI in the last 168 hours. BNP: Invalid input(s): POCBNP CBG: No results for input(s): GLUCAP in the last 168 hours. D-Dimer No results for input(s): DDIMER in the last 72 hours. Hgb A1c Recent Labs    06/13/21 0516  HGBA1C 5.5   Lipid Profile Recent Labs    06/14/21 0355  CHOL 82  HDL 39*  LDLCALC 22  TRIG 106  CHOLHDL 2.1   Thyroid function studies No results for input(s): TSH, T4TOTAL, T3FREE, THYROIDAB in the last 72 hours.  Invalid input(s): FREET3 Anemia work up No results for input(s): VITAMINB12, FOLATE, FERRITIN, TIBC, IRON, RETICCTPCT in the last 72 hours. Urinalysis    Component Value Date/Time    COLORURINE YELLOW (A) 06/12/2021 0553   APPEARANCEUR CLEAR (A) 06/12/2021 0553   APPEARANCEUR Clear 06/12/2020 1400   LABSPEC >1.046 (H) 06/12/2021 0553   PHURINE 5.0 06/12/2021 0553   GLUCOSEU NEGATIVE 06/12/2021 0553   HGBUR NEGATIVE 06/12/2021 0553   BILIRUBINUR NEGATIVE 06/12/2021 0553   BILIRUBINUR Negative 06/12/2020 1400   KETONESUR NEGATIVE 06/12/2021 0553   PROTEINUR NEGATIVE 06/12/2021 0553   NITRITE NEGATIVE 06/12/2021 0553   LEUKOCYTESUR NEGATIVE 06/12/2021 0553   Sepsis Labs Invalid input(s): PROCALCITONIN,  WBC,  LACTICIDVEN Microbiology Recent Results (from the past 240 hour(s))  C Difficile Quick Screen w PCR reflex     Status: None   Collection Time: 06/13/21  8:20 PM   Specimen: STOOL  Result Value Ref Range Status   C Diff antigen NEGATIVE NEGATIVE Final   C Diff toxin NEGATIVE NEGATIVE Final   C Diff interpretation No C. difficile detected.  Final    Comment: Performed at [facility], [address] [place], [place] [address]  Gastrointestinal Panel by PCR , Stool     Status: None   Collection Time: 06/13/21  8:20 PM   Specimen: Stool  Result Value Ref Range Status   Campylobacter species NOT DETECTED NOT DETECTED Final   Plesimonas shigelloides NOT DETECTED NOT DETECTED Final   Salmonella species NOT DETECTED NOT DETECTED Final   Yersinia enterocolitica NOT DETECTED NOT DETECTED Final   Vibrio species NOT DETECTED NOT DETECTED Final   Vibrio cholerae NOT DETECTED NOT DETECTED Final   Enteroaggregative E coli (EAEC) NOT DETECTED NOT DETECTED Final   Enteropathogenic E coli (  EPEC) NOT DETECTED NOT DETECTED Final   Enterotoxigenic E coli (ETEC) NOT DETECTED NOT DETECTED Final   Shiga like toxin producing E coli (STEC) NOT DETECTED NOT DETECTED Final   Shigella/Enteroinvasive E coli (EIEC) NOT DETECTED NOT DETECTED Final   Cryptosporidium NOT DETECTED NOT DETECTED Final   Cyclospora cayetanensis NOT DETECTED NOT DETECTED Final   Entamoeba  histolytica NOT DETECTED NOT DETECTED Final   Giardia lamblia NOT DETECTED NOT DETECTED Final   Adenovirus F40/41 NOT DETECTED NOT DETECTED Final   Astrovirus NOT DETECTED NOT DETECTED Final   Norovirus GI/GII NOT DETECTED NOT DETECTED Final   Rotavirus A NOT DETECTED NOT DETECTED Final   Sapovirus (I, II, IV, and V) NOT DETECTED NOT DETECTED Final    Comment: Performed at [facility], [address] [place], [place] [address]    Procedures/Studies: CT Head Wo Contrast  Result Date: 06/12/2021 CLINICAL DATA:  Seizure EXAM: CT HEAD WITHOUT CONTRAST TECHNIQUE: Contiguous axial images were obtained from the base of the skull through the vertex without intravenous contrast. RADIATION DOSE REDUCTION: This exam was performed according to the departmental dose-optimization program which includes automated exposure control, adjustment of the mA and/or kV according to patient size and/or use of iterative reconstruction technique. COMPARISON:  10/02/2020 FINDINGS: Brain: No evidence of acute infarction, hemorrhage, hydrocephalus, extra-axial collection or mass lesion/mass effect. Encephalomalacic changes related to old left MCA distribution infarct. Old left basal ganglia lacunar infarct. Subcortical white matter and periventricular small vessel ischemic changes. Vascular: No hyperdense vessel or unexpected calcification. Skull: Normal. Negative for fracture or focal lesion. Sinuses/Orbits: The visualized paranasal sinuses are essentially clear. The mastoid air cells are unopacified. Other: None. IMPRESSION: No acute intracranial abnormality. 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 Old left basal ganglia lacunar infarct. Small vessel ischemic changes. Electronically Signed   By: Julian Hy M.D.   On: 06/12/2021 22:45   MR BRAIN WO CONTRAST  Result Date: 06/13/2021 CLINICAL DATA:  Seizure disorder. EXAM: MRI HEAD WITHOUT CONTRAST TECHNIQUE: Multiplanar, multiecho pulse sequences of the brain and  surrounding structures were obtained without intravenous contrast. COMPARISON:  CT head 06/12/2021.  MRI head 09/29/2020 FINDINGS: Brain: Negative for acute infarct. Large territory chronic infarct left posterior MCA territory with chronic hemorrhage. Wallerian degeneration left cerebral peduncle. Chronic lacunar infarction in the right pons and left posterior thalamus. Chronic microvascular ischemic change in the white matter. Negative for hydrocephalus. Negative for mass or fluid collection Vascular: Normal arterial flow voids at the skull base. Possible occlusion left middle cerebral artery. This is unchanged from 2022. Skull and upper cervical spine: No focal skeletal lesion. Sinuses/Orbits: Retention cyst left maxillary sinus. Remaining sinuses clear. Negative orbit Other: None IMPRESSION: No acute intracranial abnormality Large territory chronic infarct left posterior MCA territory. Additional areas of chronic ischemia as described above. Electronically Signed   By: Franchot Gallo M.D.   On: 06/13/2021 18:14   CT ABDOMEN PELVIS W CONTRAST  Result Date: 06/12/2021 CLINICAL DATA:  Nausea and vomiting. Recent hospitalization for epilepsy. Bradycardia. EXAM: CT ABDOMEN AND PELVIS WITH CONTRAST TECHNIQUE: Multidetector CT imaging of the abdomen and pelvis was performed using the standard protocol following bolus administration of intravenous contrast. RADIATION DOSE REDUCTION: This exam was performed according to the departmental dose-optimization program which includes automated exposure control, adjustment of the mA and/or kV according to patient size and/or use of iterative reconstruction technique. CONTRAST:  143mL OMNIPAQUE IOHEXOL 300 MG/ML  SOLN COMPARISON:  06/06/2020 FINDINGS: Lower chest: Atelectasis in the lung bases. Fluid-filled  mildly distended esophagus, likely indicating reflux disease or dysmotility. Hepatobiliary: No focal liver abnormality is seen. No gallstones, gallbladder wall thickening,  or biliary dilatation. Pancreas: Unremarkable. No pancreatic ductal dilatation or surrounding inflammatory changes. Spleen: Normal in size without focal abnormality. Adrenals/Urinary Tract: No adrenal gland nodules. Kidneys are symmetrical. No hydronephrosis or hydroureter. No solid mass lesions. Bladder is normal. Stomach/Bowel: Stomach, small bowel, and colon are not abnormally distended. No wall thickening or inflammatory changes. Appendix is normal. Vascular/Lymphatic: Scattered calcification of the aorta. No aneurysm. Inferior vena caval filter is present. Reproductive: Prostate gland is mildly enlarged. Other: No abdominal wall hernia or abnormality. No abdominopelvic ascites. Musculoskeletal: Degenerative changes in the spine. No destructive bone lesions. IMPRESSION: 1. Fluid-filled distended esophagus likely indicating reflux disease or dysmotility. No obstructing lesion is identified. 2. No evidence of bowel obstruction or inflammation. 3. Aortic atherosclerosis. Electronically Signed   By: Lucienne Capers M.D.   On: 06/12/2021 22:48     Time coordinating discharge: Over 30 minutes    Dwyane Dee, MD  [facility] 06/14/2021, 2:51 PM

## 2021-06-14 NOTE — Progress Notes (Signed)
Patient sister [name] was given verbal and written discharge instructions, she acknowledge understanding and states she will comply.Patient taken to car by wheelchair, no distress when leaving the floor.

## 2021-06-19 ENCOUNTER — Telehealth: Payer: Self-pay | Admitting: Physical Therapy

## 2021-06-19 ENCOUNTER — Ambulatory Visit: Payer: Medicaid Other | Admitting: Physical Therapy

## 2021-06-19 ENCOUNTER — Ambulatory Visit: Payer: Medicaid Other | Admitting: Speech Pathology

## 2021-06-19 NOTE — Telephone Encounter (Signed)
Spoke with Jonathan Newman ( Pt caregiver and sister) and she reports he would like to cancel appointment for SLP and PT for 06/19/21. Pt has not quite bounced back after recent hospitalization but hopes to be feeling good enough for therapy sessions in the next few weeks. Pt having increased difficulty with bed mobility and appetite still. Pt caregiver also informed regarding need for new order prior continuation of rehab services and she verbalizes understating. New order has been sent to doctor for approval.   Jonathan Newman PT, DPT

## 2021-06-26 ENCOUNTER — Ambulatory Visit: Payer: Medicaid Other | Admitting: Physical Therapy

## 2021-06-26 ENCOUNTER — Ambulatory Visit: Payer: Medicaid Other | Attending: Family Medicine | Admitting: Speech Pathology

## 2021-06-26 DIAGNOSIS — R4701 Aphasia: Secondary | ICD-10-CM | POA: Insufficient documentation

## 2021-06-26 DIAGNOSIS — R262 Difficulty in walking, not elsewhere classified: Secondary | ICD-10-CM | POA: Diagnosis present

## 2021-06-26 DIAGNOSIS — R41841 Cognitive communication deficit: Secondary | ICD-10-CM | POA: Insufficient documentation

## 2021-06-26 DIAGNOSIS — R482 Apraxia: Secondary | ICD-10-CM | POA: Diagnosis present

## 2021-06-26 DIAGNOSIS — R2681 Unsteadiness on feet: Secondary | ICD-10-CM | POA: Diagnosis present

## 2021-06-26 DIAGNOSIS — R2689 Other abnormalities of gait and mobility: Secondary | ICD-10-CM | POA: Insufficient documentation

## 2021-06-26 DIAGNOSIS — M6281 Muscle weakness (generalized): Secondary | ICD-10-CM | POA: Diagnosis present

## 2021-06-26 DIAGNOSIS — R278 Other lack of coordination: Secondary | ICD-10-CM | POA: Diagnosis present

## 2021-06-26 NOTE — Therapy (Signed)
[place] [facility] MAIN [facility] SERVICES [address] [place], [place], [address] Phone: [phone]   Fax:  [phone]  Physical Therapy Treatment  Patient Details  Name: Jonathan Newman MRN: [id number] Date of Birth: January 02, 1961 No data recorded  Encounter Date: 06/26/2021   PT End of Session - 06/26/21 1555     Visit Number 26    Number of Visits 30    Date for PT Re-Evaluation 07/17/21    Authorization Type Medicaid=Authorized combined PT/OT/SLP 27 visits    Progress Note Due on Visit 30    PT Start Time 1517    PT Stop Time 1600    PT Time Calculation (min) 43 min    Equipment Utilized During Treatment Gait belt    Activity Tolerance Patient tolerated treatment well    Behavior During Therapy Impulsive             Past Medical History:  Diagnosis Date   Constipation    CVA (cerebral vascular accident) (HCC)    Diarrhea    Dysarthria and anarthria    Hemiplegia and hemiparesis following cerebral infarction affecting right dominant side (HCC)    Hyperlipidemia    Pain in right leg     No past surgical history on file.  There were no vitals filed for this visit.   Subjective Assessment - 06/26/21 1714     Subjective Patient reports he is feeling better following recent hospitalization.  Patient's caregiver reports he is eating a little bit better but still his appetite has not returned to its previous level.  Patient has had no new falls or loss of balance and also has not had no reports of seizure-like activity since hospitalization.    Patient is accompained by: Family member   Sister [name]   Pertinent History Patient is a 61 year old male with recent history of L MCA stroke in Feb 2021.Per Dr. 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 Had  developed DVT afterwards. Patient is present with sister, [name] today who reports he has been living with her since May 2022 and uses his electric w/c in the home an has not walked since CVA- has manual w/c that he uses for community outings. She reports he requires assist with all ADLs and is impulsive but does require assist with transfers for safety- but states he does go to bathroom on his own at times.    Limitations Lifting;Standing;Walking;Writing;Reading;House hold activities    How long can you sit comfortably? no issues or limits    How long can you stand comfortably? <    How long can you walk comfortably? Has not walked since CVA    Patient Stated Goals Caregiver goal- to be as independent and do as much as he can on his own.               Physical therapy treatment session today consisted of completing assessment of goals and administration of testing as demonstrated in flow sheet. Addition treatments may be found below.      INTERVENTIONS: Therex: Nustep level 2/3 with R LE leg stabalizer in place to prevent ER of R LE.  x 3 min level 2 with 1 minute rest break.  X 3 minutes level 2   Gait training: in // bars:  Patient ambulated in //  x 4 laps  -patient had to take rest breaks about halfway through parallel bars as well as performing increased independence this session with only min assist from therapist for assistance with balance.  Patient still putting very little weight through right involved lower extremity but with cues patient able to take small steps this session.  Patient had right AFO donned(clinic AFO) and this did seem to improve his right lower extremity stability but he still unable to achieve adequate terminal knee extension for adequate weightbearing on the involved side.  We will continue to work on this in future sessions  Static standing:  -Lateral weight shifting over to right side and attempting to lock knee in extension. Mod A from PT to keep R  knee locked in extension     Pt required occasional rest breaks due fatigue, PT was quick to ask when pt appeared to be fatiguing in order to prevent excessive fatigue. - pt had one incident where he stared off into space and had difficulty with focussing on questions caregiver is asking him. Caregiver reports this happens occasionally and only lasts a couple of minutes.   Education provided throughout session via VC/TC and demonstration to facilitate movement at target joints and correct muscle activation for all testing and exercises performed.   Note: Portions of this document were prepared using Dragon voice recognition software and although reviewed may contain unintentional dictation errors in syntax, grammar, or spelling.  Pt required occasional rest breaks due fatigue, PT was quick to ask when pt appeared to be fatiguing in order to prevent excessive fatigue.                            PT Education - 06/26/21 1714     Education Details Walking and standing technique    Person(s) Educated Patient    Methods Explanation    Comprehension Verbalized understanding              PT Short Term Goals - 04/24/21 0815       PT SHORT TERM GOAL #1   Title Pt will be independent with HEP in order to improve strength and balance in order to decrease fall risk and improve function at home and work.    Baseline 10/25/2020= Patient has no formal HEP in place12/8: Pt caregiver report sHEP has been going well.    Time 6    Period Weeks    Status Achieved    Target Date 12/06/20      PT SHORT TERM GOAL #2   Title Patient will perform all sit to stand transfers with CGA for improved functional mobility and decreased dependence on caregivers.    Baseline 10/25/2020=Patient requires mod assist with all sit to stand transfers, 12/8: sister reports improved independence with most transfers, some guarding still necessary for safety, deomnstrates ability to transfer to/from  nuscetp and WC as well as to/from mat table.    Time 6    Period Weeks    Status Achieved    Target Date 12/06/20      PT SHORT TERM GOAL #3   Title Patient perform 5 time sit to stand in 50 seconds or less in order to indicate improved lower extremity strength and stability.    Baseline 01/30/21 Able to complete 2 sit to stands but only able to utilize left lower extremity has difficulty with balance upon standing.  04/24/2021= 28 sec with left UE support  Time 12    Period Weeks    Status Achieved    Target Date 04/24/21               PT Long Term Goals - 06/26/21 1544       PT LONG TERM GOAL #1   Title Pt will improve FOTO to target score of 43  to display perceived improvements in ability to complete ADL's.    Baseline 10/25/2020= 29 12/8: 33.1 01/30/21: 36; 06/26/21: 31    Time 12    Period Weeks    Status Revised    Target Date 07/17/21      PT LONG TERM GOAL #2   Title Patient will perform stand pivot transfer with supervision from all surfaces without loss of balance or VC for safety.    Baseline 10/25/2020- Patient requires Mod assist for safe transfers with min ability to place any weight through right LE.01/30/21: able to perform without assistance but still requiries supervision as well as UE assistance on surfaces; 04/24/2021=Patient demo supervision with SPT today from wheelchair to mat and both he and his sister report he is doing this independently at home.    Time 12    Period Weeks    Status Achieved    Target Date 04/24/21      PT LONG TERM GOAL #3   Title Patient will perform static stand with least restrictive assistive device > 2 min with > 25% weight bearing through right LE without LOB.    Baseline 12/8: able to stand for approximately 30 sec with R LE on airex pad and Min WB on the R LE and ModA from 2x PT for LE placement and to assist with weight shifting, 01/31/20: stands for 1:25 with CGA from PT for balance and with min weight through R LE (foot is on  floor); 04/24/2021= Patient was able to demonstrate 2 min 43 sec of static stand ensuring he was placing approx 25% weight bearing through his Right LE.    Time 12    Period Weeks    Status Achieved    Target Date 03/22/21      PT LONG TERM GOAL #4   Title Patient will perform 5 times sit to stand test and 40 seconds or less in order to indicate increased lower extremity power and ability to perform transfers.    Baseline 1/10/23There were to perform several repetitions of sit to stand and unable to bear weight through left lower extremity and also signs of unsteadiness when going to standing position. 04/24/2021= 28 sec with left UE support and although approx 25 % WB through Right LE    Time 12    Period Weeks    Status Achieved    Target Date 04/24/21      PT LONG TERM GOAL #5   Title Patient will ambulate with least restrictive assistive device and min assist > 25 feet without loss of balance exhibited > 25% weight bearing through Right LE for improved functional mobility in home.    Baseline 04/24/2021- patient demo ability to walk in // bars using 1 rail with max VC and mod assist to transfer some weight over to right side - minimal WB through Right LE. 06/26/21: Patient ambulates 8 feet at a time in parallel bars with minimal weightbearing through right lower extremity    Time 12    Period Weeks    Status On-going    Target Date 07/17/21      PT LONG  TERM GOAL #6   Title Patient will perform static stand with least restrictive assistive device > 2 min with 50% weight bearing through right LE without LOB.    Baseline 04/24/2021-Patient was able to demonstrate 2 min 43 sec of static stand ensuring he was placing approx 25% weight bearing through his Right LE.6/6: able to stand 1:33 with R AFo and PT assist for R LE WB > 50%    Time 12    Period Weeks    Status On-going    Target Date 07/17/21                   Plan - 06/26/21 1722     Clinical Impression Statement Patient  presents with good motivation for completion of physical therapy activities.  Goals were assessed this session and patient demonstrates improved ability to stand and ambulate with increased weightbearing on right lower extremity although weightbearing on the side is still minimal.  Patient's focus on therapeutic outcome survey scale is improved slightly but overall movement and this is minimal likely due to patient's continued inability to ambulate secondary to his mobility deficits.  Patient does show good progress with right lower extremity strengthening as evidenced by improved ability to tolerate increased resistance on NuStep.  Patient is still very reluctant to put weight through right lower extremity and requires significant upper extremity support.  Patient will continue to benefit from skilled physical therapy services in order to improve his lower extremity strength, mobility, and overall function. Patient's condition has the potential to improve in response to therapy. Maximum improvement is yet to be obtained. The anticipated improvement is attainable and reasonable in a generally predictable time.      Personal Factors and Comorbidities Comorbidity 3+;Time since onset of injury/illness/exacerbation    Comorbidities CVA, Aphasia, Hyperlipidemia    Examination-Activity Limitations Bathing;Bed Mobility;Bend;Caring for Others;Carry;Lift;Reach Overhead;Squat;Stairs;Stand;Toileting;Transfers    Examination-Participation Restrictions Cleaning;Community Activity;Driving;Laundry;Medication Management;Meal Prep;Occupation;Personal Finances;Shop;Yard Work    Rehab Potential Fair    PT Frequency 1x / week    PT Duration 12 weeks    PT Treatment/Interventions ADLs/Self Care Home Management;Aquatic Therapy;Cryotherapy;Electrical Stimulation;Moist Heat;Ultrasound;DME Instruction;Gait training;Stair training;Functional mobility training;Therapeutic activities;Therapeutic exercise;Balance training;Neuromuscular  re-education;Patient/family education;Wheelchair mobility training;Manual techniques;Passive range of motion;Dry needling;Splinting;Orthotic Fit/Training;Taping    PT Next Visit Plan 10# ankle weight on R LE for increased proprioception. Attempt squats with this setup.    PT Home Exercise Plan no updates    Consulted and Agree with Plan of Care Patient;Family member/caregiver    Family Member Consulted Sister- [name]             Patient will benefit from skilled therapeutic intervention in order to improve the following deficits and impairments:  Abnormal gait, Decreased activity tolerance, Decreased balance, Decreased cognition, Decreased coordination, Decreased endurance, Decreased knowledge of use of DME, Decreased mobility, Decreased range of motion, Decreased safety awareness, Decreased strength, Difficulty walking, Hypomobility, Impaired perceived functional ability, Impaired flexibility, Impaired sensation, Impaired UE functional use  Visit Diagnosis: Difficulty in walking, not elsewhere classified  Other abnormalities of gait and mobility  Unsteadiness on feet     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 [place], [place], [address] Phone: [phone]   Fax:  [phone]  Name: Margarita Bobrowski MRN: [id number] Date of Birth: 1960-03-12

## 2021-06-26 NOTE — Therapy (Signed)
[place] MAIN [facility] SERVICES [address] [place], [place], [address] Phone: [phone]   Fax:  [phone]  Speech Language Pathology Treatment  Patient Details  Name: Jonathan Newman MRN: [id number] Date of Birth: 10/08/1960 Referring Provider (SLP): Dr. Jennings Books   Encounter Date: 06/26/2021   End of Session - 06/26/21 1857     Visit Number 22    Number of Visits 27    Date for SLP Re-Evaluation 07/09/21    Authorization Type Medicaid - 27 visits combined for SLP/PT; 12 ST visits auth 02/07/21-05/01/21    Authorization Time Period Visits used in 2023 (PT/ST comb: 10 on 04/10/21)    Authorization - Visit Number 6    Authorization - Number of Visits 12    Progress Note Due on Visit 10    SLP Start Time 1405   pt using restroom at 1400   SLP Stop Time  1500    SLP Time Calculation (min) 55 min    Activity Tolerance Patient tolerated treatment well             Past Medical History:  Diagnosis Date   Constipation    CVA (cerebral vascular accident) ([place])    Diarrhea    Dysarthria and anarthria    Hemiplegia and hemiparesis following cerebral infarction affecting right dominant side ([place])    Hyperlipidemia    Pain in right leg     No past surgical history on file.  There were no vitals filed for this visit.   Subjective Assessment - 06/26/21 1814     Subjective Pt hospitalized x2 with seizures since previous visit    Patient is accompained by: Family member   sister Jonathan Newman   Currently in Pain? No/denies                   ADULT SLP TREATMENT - 06/26/21 1814       General Information   Behavior/Cognition Alert;Cooperative;Decreased sustained attention    HPI Jonathan Newman is a 61 y.o. male referred by Dr. Manuella Ghazi for SLP evaluation. Hx noted for DVT, HTN Pt with reported CVA in February 2021 (records from OSH are unavailable), with subsequent rehabilitation in [place]. Pt now living with his sister in [place].  Repeat MRI brain on 09/29/20 showed "Large chronic left MCA territory and left MCA/PCA watershed territory cortical/subcortical infarct, as described. Superimposed chronic perforator infarct within the left centrum semiovale/corona radiata and left basal ganglia. Associated wallerian degeneration on the left." Chronic infarcts also within left thalamus and bilateral pons, and mild generalized cerebral and cerebellar atrophy.      Cognitive-Linquistic Treatment   Treatment focused on Aphasia    Skilled Treatment Patient introduced self to graduate student clinician, initiating sharing some details about himself (stroke story, personal history). Answered "Jonathan Newman" questions 75% of the time, with redirection/clarification necessary 25% of the time (comprehension and distractibility). Added content to pt's device to increase information gathering from new student clinician. Min cue x1 to await response before changing topic. Sister reports pt communicating requests for outings 70% of the time at home. Pt demonstrated creativity/ cognitive flexibility using his device by navigating through multiple menus and combining a series of icons to share information about his birthday hopes (going to [facility] with his family but having some independence on an outing).      Assessment / Recommendations / Plan   Plan Continue with current plan of care; Goals reassessed and remain appropriate     Progression  Toward Goals   Progression toward goals Progressing toward goals                SLP Short Term Goals - 05/16/21 5638       SLP SHORT TERM GOAL #1   Title Patient will comprehend simple Y/N questions >80% accuracy with written cues/ AAC support.    Baseline 80%    Time 5    Period Weeks    Status Achieved      SLP SHORT TERM GOAL #2   Title Patient will indicate when he does not comprehend in >80% of trials (word to sentence level), using facial expression, gesture, or AAC.    Baseline 90%    Time 5     Period Weeks    Status Partially Met      SLP SHORT TERM GOAL #3   Title Patient will answer simple biographical questions 80% accuracy (Y/N questions or AAC).    Baseline 80% with extended time, repetition, supportive conversation strategies    Time 5    Period Weeks    Status Partially Met      SLP SHORT TERM GOAL #4   Title Per pt/caregiver report, patient will use AAC to make request related to physical needs or outings at least 5/7 days with min cues.    Baseline 05/01/21: sister reports pt making daily requests    Time 6    Period --   sessions   Status Achieved    Target Date --   by visit 29     SLP SHORT TERM GOAL #5   Title Pt will use AAC to reduce frustration by relaying feelings or needs during a communication breakdown with min cues, per pt/family report.    Baseline 01/31/21 Patient has emotional outbursts  05/01/21 Patient no longer has emotional outbursts; uses facial expressions, can use device to make common needs/feelings known but has difficulty with more abstract/less common needs 05/15/21: needs cues to use device outside of ST for this    Time 6    Period --   sessions   Status Partially Met    Target Date --   by visit 20             SLP Long Term Goals - 06/26/21 1835       SLP LONG TERM GOAL #1   Title Pt will communicate emergency information 100% accuracy, using AAC if necessary.    Baseline 01/31/21 with moderate cues; progressing, will continue, 05/01/21 80% 05/15/21 90%    Time 12    Period Weeks    Status On-going   will renew all LTGs; goals not met as pt was unable to attend therapy since 1/17 due to scheduling   Target Date 07/30/21      SLP LONG TERM GOAL #2   Title Patient will reduce frustration by communicating thoughts/feelings with AAC in 80% of opportunities.    Baseline 01/31/21 beginning to combine facial expressions with use of AAC icons; progressing, will continue 05/01/21 60% 05/15/21 80% with mod cues 06/26/21 90% with min cues     Time 12    Period Weeks    Status On-going    Target Date 07/30/21      SLP LONG TERM GOAL #3   Title Pt will successfully convey requests for outings/ personal needs outside of ST >75% of the time, using AAC if necessary.    Baseline 01/31/21 able to do so in therapy sessions; ongoing training necessary  for carryover at home 05/01/21: In sessions 100%, at home 50%, 06/26/21: 100% in sessions, home 70%    Time 12    Period Weeks    Status On-going    Target Date 07/30/21      SLP LONG TERM GOAL #4   Title Pt will reduce social isolation risk by participating in simple conversational exchange (3-5 minutes), using AAC.    Time 12    Period Weeks    Status Achieved      SLP LONG TERM GOAL #5   Title Pt will answer Jonathan Newman questions 80% of the time with min cues during a communication breakdown.    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 Jonathan Newman has made excellent progress using his device to communicate basic wants/needs as well as to initiate communication exchanges, and share feelings in structured setting. Patient was hospitalized with seizures x2 since previous visit; SLP visited pt on the floor and noted his communication device at bedside, which he used to communicate with visitors and medical providers. Pt improving use of his device to answer [employer] questions in simulated communication breakdowns, as well as in actual breakdowns in session today. Sister reports use of device outside ST is improving, continues to require cues to initiate use at times. Encouraged sister to bring device on all outings. He is progressing toward LTGs, which were recently updated. Current goals remain appropriate, anticipate he will meet goals by 07/30/21. I  recommend continuing skilled ST with focus on improving functional communication, customizing AAC device to support pt in communicating/comprehending.    Speech Therapy Frequency 1x /week    Duration 12 weeks    Treatment/Interventions Language facilitation;Environmental controls;Cueing hierarchy;SLP instruction and feedback;Compensatory techniques;Cognitive reorganization;Functional tasks;Compensatory strategies;Patient/family education;Internal/external aids;Multimodal communcation approach    Potential to Achieve Goals Good   with AAC   Potential Considerations Severity of impairments;Financial resources   has good family support, appears highly motivated   Consulted and Agree with Plan of Care Patient             Patient will benefit from skilled therapeutic intervention in order to improve the following deficits and impairments:   Aphasia  Verbal apraxia  Cognitive communication deficit    Problem List Patient Active Problem List   Diagnosis Date Noted   Hypokalemia 06/13/2021   Depression 06/13/2021   Dyslipidemia 06/13/2021   AKI (acute kidney injury) ([place]) 06/13/2021   Seizure ([place]) 06/12/2021   Status post placement of implantable loop recorder 11/02/2020   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 [place], [place], [address] Phone: [phone]   Fax:  [phone]   Name: Jonathan Newman MRN: [id number] Date of Birth: 05-07-1960

## 2021-06-27 NOTE — Addendum Note (Signed)
Addended by: Arlana Lindau on: 06/27/2021 08:29 AM   Modules accepted: Orders

## 2021-06-29 ENCOUNTER — Other Ambulatory Visit: Payer: Self-pay | Admitting: Urology

## 2021-07-03 ENCOUNTER — Ambulatory Visit: Payer: Medicaid Other

## 2021-07-03 ENCOUNTER — Ambulatory Visit: Payer: Medicaid Other | Admitting: Speech Pathology

## 2021-07-03 DIAGNOSIS — M6281 Muscle weakness (generalized): Secondary | ICD-10-CM

## 2021-07-03 DIAGNOSIS — R278 Other lack of coordination: Secondary | ICD-10-CM

## 2021-07-03 DIAGNOSIS — R4701 Aphasia: Secondary | ICD-10-CM

## 2021-07-03 DIAGNOSIS — R41841 Cognitive communication deficit: Secondary | ICD-10-CM

## 2021-07-03 DIAGNOSIS — R2681 Unsteadiness on feet: Secondary | ICD-10-CM

## 2021-07-03 DIAGNOSIS — R262 Difficulty in walking, not elsewhere classified: Secondary | ICD-10-CM

## 2021-07-03 DIAGNOSIS — R482 Apraxia: Secondary | ICD-10-CM

## 2021-07-03 NOTE — Therapy (Unsigned)
[facility] MAIN [facility] SERVICES [address] [place], [place], [address] Phone: [phone]   Fax:  [phone]  Physical Therapy Treatment  Patient Details  Name: Jonathan Newman MRN: [id number] Date of Birth: Mar 15, 1960 No data recorded  Encounter Date: 07/03/2021    Past Medical History:  Diagnosis Date   Constipation    CVA (cerebral vascular accident) (HCC)    Diarrhea    Dysarthria and anarthria    Hemiplegia and hemiparesis following cerebral infarction affecting right dominant side (HCC)    Hyperlipidemia    Pain in right leg     No past surgical history on file.  There were no vitals filed for this visit.     Physical therapy treatment session today consisted of completing assessment of goals and administration of testing as demonstrated in flow sheet. Addition treatments may be found below.          INTERVENTIONS: Therex: Nustep level 3  with R LE leg stabalizer in place to prevent ER of R LE.   X4 min  5 STS pulling to stand with LUE  Seated on green dynadisc balloon toss for balance   Static standing:  -Lateral weight shifting over to right side and attempting to lock knee in extension. Mod A from PT to keep R knee locked in extension   Progressed to green airex pad under LLE     2x3 STS  Seated with airex pad and dynadisc on chair basketball  -progressed to addition of dynadisc also under B feet    --- Gait training: in // bars:  Patient ambulated in // x 4 laps  -patient had to take rest breaks about halfway through parallel bars as well as performing increased independence this session with only min assist from therapist for assistance with balance.  Patient still putting very little weight through right involved lower extremity but with cues patient able to take small steps this session.  Patient had right AFO donned(clinic AFO) and this did seem to improve his right lower extremity stability but he still  unable to achieve adequate terminal knee extension for adequate weightbearing on the involved side.  We will continue to work on this in future sessions   Static standing:  -Lateral weight shifting over to right side and attempting to lock knee in extension. Mod A from PT to keep R knee locked in extension       Pt required occasional rest breaks due fatigue, PT was quick to ask when pt appeared to be fatiguing in order to prevent excessive fatigue. - pt had one incident where he stared off into space and had difficulty with focussing on questions caregiver is asking him. Caregiver reports this happens occasionally and only lasts a couple of minutes.    Education provided throughout session via VC/TC and demonstration to facilitate movement at target joints and correct muscle activation for all testing and exercises performed.     Note: Portions of this document were prepared using Dragon voice recognition software and although reviewed may contain unintentional dictation errors in syntax, grammar, or spelling.   Pt required occasional rest breaks due fatigue, PT was quick to ask when pt appeared to be fatiguing in order to prevent excessive fatigue.                            PT Short Term Goals - 04/24/21 0815       PT SHORT  TERM GOAL #1   Title Pt will be independent with HEP in order to improve strength and balance in order to decrease fall risk and improve function at home and work.    Baseline 10/25/2020= Patient has no formal HEP in place12/8: Pt caregiver report sHEP has been going well.    Time 6    Period Weeks    Status Achieved    Target Date 12/06/20      PT SHORT TERM GOAL #2   Title Patient will perform all sit to stand transfers with CGA for improved functional mobility and decreased dependence on caregivers.    Baseline 10/25/2020=Patient requires mod assist with all sit to stand transfers, 12/8: sister reports improved independence with most  transfers, some guarding still necessary for safety, deomnstrates ability to transfer to/from nuscetp and WC as well as to/from mat table.    Time 6    Period Weeks    Status Achieved    Target Date 12/06/20      PT SHORT TERM GOAL #3   Title Patient perform 5 time sit to stand in 50 seconds or less in order to indicate improved lower extremity strength and stability.    Baseline 01/30/21 Able to complete 2 sit to stands but only able to utilize left lower extremity has difficulty with balance upon standing.  04/24/2021= 28 sec with left UE support    Time 12    Period Weeks    Status Achieved    Target Date 04/24/21               PT Long Term Goals - 06/26/21 1544       PT LONG TERM GOAL #1   Title Pt will improve FOTO to target score of 43  to display perceived improvements in ability to complete ADL's.    Baseline 10/25/2020= 29 12/8: 33.1 01/30/21: 36; 06/26/21: 31    Time 12    Period Weeks    Status Revised    Target Date 07/17/21      PT LONG TERM GOAL #2   Title Patient will perform stand pivot transfer with supervision from all surfaces without loss of balance or VC for safety.    Baseline 10/25/2020- Patient requires Mod assist for safe transfers with min ability to place any weight through right LE.01/30/21: able to perform without assistance but still requiries supervision as well as UE assistance on surfaces; 04/24/2021=Patient demo supervision with SPT today from wheelchair to mat and both he and his sister report he is doing this independently at home.    Time 12    Period Weeks    Status Achieved    Target Date 04/24/21      PT LONG TERM GOAL #3   Title Patient will perform static stand with least restrictive assistive device > 2 min with > 25% weight bearing through right LE without LOB.    Baseline 12/8: able to stand for approximately 30 sec with R LE on airex pad and Min WB on the R LE and ModA from 2x PT for LE placement and to assist with weight shifting, 01/31/20:  stands for 1:25 with CGA from PT for balance and with min weight through R LE (foot is on floor); 04/24/2021= Patient was able to demonstrate 2 min 43 sec of static stand ensuring he was placing approx 25% weight bearing through his Right LE.    Time 12    Period Weeks    Status Achieved  Target Date 03/22/21      PT LONG TERM GOAL #4   Title Patient will perform 5 times sit to stand test and 40 seconds or less in order to indicate increased lower extremity power and ability to perform transfers.    Baseline 1/10/23There were to perform several repetitions of sit to stand and unable to bear weight through left lower extremity and also signs of unsteadiness when going to standing position. 04/24/2021= 28 sec with left UE support and although approx 25 % WB through Right LE    Time 12    Period Weeks    Status Achieved    Target Date 04/24/21      PT LONG TERM GOAL #5   Title Patient will ambulate with least restrictive assistive device and min assist > 25 feet without loss of balance exhibited > 25% weight bearing through Right LE for improved functional mobility in home.    Baseline 04/24/2021- patient demo ability to walk in // bars using 1 rail with max VC and mod assist to transfer some weight over to right side - minimal WB through Right LE. 06/26/21: Patient ambulates 8 feet at a time in parallel bars with minimal weightbearing through right lower extremity    Time 12    Period Weeks    Status On-going    Target Date 07/17/21      PT LONG TERM GOAL #6   Title Patient will perform static stand with least restrictive assistive device > 2 min with 50% weight bearing through right LE without LOB.    Baseline 04/24/2021-Patient was able to demonstrate 2 min 43 sec of static stand ensuring he was placing approx 25% weight bearing through his Right LE.6/6: able to stand 1:33 with R AFo and PT assist for R LE WB > 50%    Time 12    Period Weeks    Status On-going    Target Date 07/17/21                     Patient will benefit from skilled therapeutic intervention in order to improve the following deficits and impairments:     Visit Diagnosis: No diagnosis found.     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/05/2020   Lower limb ulcer, calf, right, limited to breakdown of skin (HCC) 03/07/2020   History of CVA (cerebrovascular accident) 03/07/2020   DVT (deep venous thrombosis) (HCC) 03/07/2020    Baird KayHaley R Karinna Beadles, PT 07/03/2021, 3:13 PM  [place] [facility]IN [facility] [address] [place], [place], [address] Phone: [phone]   Fax:  [phone]  Name: Corrinne EagleDewey Kimbler MRN: [id number] Date of Birth: 10/13/1960

## 2021-07-03 NOTE — Therapy (Signed)
[place] MAIN [facility] SERVICES [address] [place], [place], [address] Phone: [phone]   Fax:  [phone]  Speech Language Pathology Treatment and Discharge Summary  Patient Details  Name: Jonathan Newman MRN: [id number] Date of Birth: [birth year] Referring Provider (SLP): Dr. Jennings Books  SPEECH THERAPY DISCHARGE SUMMARY  Visits from Start of Care: 23  Current functional level related to goals / functional outcomes: [name] met 5/5 LTGs; he is able to use his communication device to make requests, communicate emergency information, communicate basic wants/needs, make jokes, and initiate conversation with family, friends and medical providers.   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    Education / Equipment: Sports administrator, [employer]   Patient agrees to discharge. Patient goals were met. Patient is being discharged due to meeting the stated rehab goals.    Encounter Date: 07/03/2021   End of Session - 07/03/21 1813     Visit Number 23    Number of Visits 27    Date for SLP Re-Evaluation 07/09/21    Authorization Type Medicaid - 27 visits combined for SLP/PT; 12 ST visits auth 02/07/21-05/01/21    Authorization Time Period Visits used in 2023 (PT/ST comb: 10 on 04/10/21)    Authorization - Visit Number 7    Authorization - Number of Visits 12    Progress Note Due on Visit 10    SLP Start Time 1400    SLP Stop Time  1500    SLP Time Calculation (min) 60 min    Activity Tolerance Patient tolerated treatment well             Past Medical History:  Diagnosis Date   Constipation    CVA (cerebral vascular accident) ([place])    Diarrhea    Dysarthria and anarthria    Hemiplegia and hemiparesis following cerebral infarction affecting right dominant side  (HCC)    Hyperlipidemia    Pain in right leg     No past surgical history on file.  There were no vitals filed for this visit.   Subjective Assessment - 07/03/21 1738     Subjective Pt used Lingraphica to talk about his upcoming birthday.    Currently in Pain? No/denies                   ADULT SLP TREATMENT - 07/03/21 1808       General Information   Behavior/Cognition Alert;Cooperative;Decreased sustained attention    HPI Jonathan Newman is a 61 y.o. male referred by Dr. Manuella Ghazi for SLP evaluation. Hx noted for DVT, HTN Pt with reported CVA in February 2021 (records from OSH are unavailable), with subsequent rehabilitation in [place]. Pt now living with his sister in [place]. Repeat MRI brain on 09/29/20 showed "Large chronic left MCA territory and left MCA/PCA watershed territory cortical/subcortical infarct, as described. Superimposed chronic perforator infarct within the left centrum semiovale/corona radiata and left basal ganglia. Associated wallerian degeneration on the left." Chronic infarcts also within left thalamus and bilateral pons, and mild generalized cerebral and cerebellar atrophy.      Cognitive-Linquistic Treatment   Treatment focused on Aphasia    Skilled Treatment Patient initiated conversation using his Lingraphica device. Rare min cues for clarification with topic shift. Sister reports pt using Lingraphica at home consistently when breakdowns occur and to make requests. When breakdowns occurred in session, pt answered  [place] questions (who, when, why) with rare min cues, 100% acc. When pt expressed visual signs of emotion, pt indicated his feelings using icons on his device in 3/3 opportunities. Discussed adding more icons to help pt communicate about topics of interest with family members and created new folder for sister to add content. SLP used writing and pt's Lingraphica device to aid comprehension and enable choicemaking re: mod complex topic: his remaining  therapy visits. Patient agreed that he is comfortable using his Lingraphica device, and since he is meeting long term SLP goals, he would like to save more visits for PT or for the end of the year.      Assessment / Recommendations / Plan   Plan Continue with current plan of care      Progression Toward Goals   Progression toward goals Goals met, education completed, patient discharged from SLP              SLP Education - 07/03/21 1812     Education Details [place]    Person(s) Educated Theatre stage manager)   sister   Methods Other (comment)   email             SLP Short Term Goals - 05/16/21 0921       SLP SHORT TERM GOAL #1   Title Patient will comprehend simple Y/N questions >80% accuracy with written cues/ AAC support.    Baseline 80%    Time 5    Period Weeks    Status Achieved      SLP SHORT TERM GOAL #2   Title Patient will indicate when he does not comprehend in >80% of trials (word to sentence level), using facial expression, gesture, or AAC.    Baseline 90%    Time 5    Period Weeks    Status Partially Met      SLP SHORT TERM GOAL #3   Title Patient will answer simple biographical questions 80% accuracy (Y/N questions or AAC).    Baseline 80% with extended time, repetition, supportive conversation strategies    Time 5    Period Weeks    Status Partially Met      SLP SHORT TERM GOAL #4   Title Per pt/caregiver report, patient will use AAC to make request related to physical needs or outings at least 5/7 days with min cues.    Baseline 05/01/21: sister reports pt making daily requests    Time 6    Period --   sessions   Status Achieved    Target Date --   by visit 20     SLP SHORT TERM GOAL #5   Title Pt will use AAC to reduce frustration by relaying feelings or needs during a communication breakdown with min cues, per pt/family report.    Baseline 01/31/21 Patient has emotional outbursts  05/01/21 Patient no longer has emotional outbursts;  uses facial expressions, can use device to make common needs/feelings known but has difficulty with more abstract/less common needs 05/15/21: needs cues to use device outside of ST for this    Time 6    Period --   sessions   Status Partially Met    Target Date --   by visit 20             SLP Long Term Goals - 07/03/21 1747       SLP LONG TERM GOAL #1   Title Pt will communicate emergency information 100% accuracy, using AAC if necessary.  Baseline 01/31/21 with moderate cues; progressing, will continue, 05/01/21 80% 05/15/21 90%    Time 12    Period Weeks    Status Achieved   will renew all LTGs; goals not met as pt was unable to attend therapy since 1/17 due to scheduling     SLP LONG TERM GOAL #2   Title Patient will reduce frustration by communicating thoughts/feelings with AAC in 80% of opportunities.    Baseline 01/31/21 beginning to combine facial expressions with use of AAC icons; progressing, will continue 05/01/21 60% 05/15/21 80% with mod cues 06/26/21 90% with min cues    Time 12    Period Weeks    Status Achieved      SLP LONG TERM GOAL #3   Title Pt will successfully convey requests for outings/ personal needs outside of ST >75% of the time, using AAC if necessary.    Baseline 01/31/21 able to do so in therapy sessions; ongoing training necessary for carryover at home 05/01/21: In sessions 100%, at home 50%, 06/26/21: 100% in sessions, home 70%    Time 12    Period Weeks    Status Achieved      SLP LONG TERM GOAL #4   Title Pt will reduce social isolation risk by participating in simple conversational exchange (3-5 minutes), using AAC.    Time 12    Period Weeks    Status Achieved      SLP LONG TERM GOAL #5   Title Pt will answer [place] questions 80% of the time with min cues during a communication breakdown.    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 Zaine has made excellent progress using his device to communicate basic wants/needs as well as to initiate communication exchanges, and share feelings in structured setting. Pt used his device to answer [place] questions in communication breakdowns in session today. Sister reports carryover of device outside of ST, particularly if she prompts pt "Show me on your device." Jonathan Newman has met LTGs, and is in agreement with d/c from [place] today. He may benefit from additional ST visits in the future to continue to expand his communication abilities using his Lingraphica device.    Speech Therapy Frequency 1x /week    Duration 12 weeks    Treatment/Interventions Language facilitation;Environmental controls;Cueing hierarchy;SLP instruction and feedback;Compensatory techniques;Cognitive reorganization;Functional tasks;Compensatory strategies;Patient/family education;Internal/external aids;Multimodal communcation approach    Potential to Achieve Goals Good   with AAC   Potential Considerations Severity of impairments;Financial resources   has good family support, appears highly motivated   Consulted and Agree with Plan of Care Patient             Patient will benefit from skilled therapeutic intervention in order to improve the following deficits and impairments:   Aphasia  Verbal apraxia  Cognitive communication deficit    Problem List Patient Active Problem List   Diagnosis Date Noted   Hypokalemia 06/13/2021   Depression 06/13/2021   Dyslipidemia 06/13/2021   AKI (acute kidney injury) ([place]) 06/13/2021   Seizure ([place]) 06/12/2021   Status post placement of implantable loop recorder 11/02/2020   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 [place], [place], [address] Phone: [phone]   Fax:  [phone]   Name: Aboubacar Matsuo MRN: [id number] Date of Birth: Jan 15, 1961

## 2021-07-10 ENCOUNTER — Ambulatory Visit (INDEPENDENT_AMBULATORY_CARE_PROVIDER_SITE_OTHER): Payer: Medicaid Other | Admitting: Nurse Practitioner

## 2021-07-10 ENCOUNTER — Ambulatory Visit: Payer: Medicaid Other | Admitting: Speech Pathology

## 2021-07-10 ENCOUNTER — Ambulatory Visit: Payer: Medicaid Other | Admitting: Physical Therapy

## 2021-07-17 ENCOUNTER — Ambulatory Visit: Payer: Medicaid Other | Admitting: Physical Therapy

## 2021-07-17 ENCOUNTER — Ambulatory Visit: Payer: Medicaid Other | Admitting: Speech Pathology

## 2021-07-17 ENCOUNTER — Ambulatory Visit (INDEPENDENT_AMBULATORY_CARE_PROVIDER_SITE_OTHER): Payer: Medicaid Other | Admitting: Nurse Practitioner

## 2021-07-17 ENCOUNTER — Encounter (INDEPENDENT_AMBULATORY_CARE_PROVIDER_SITE_OTHER): Payer: Self-pay | Admitting: Nurse Practitioner

## 2021-07-17 VITALS — BP 121/76 | HR 44 | Resp 15

## 2021-07-17 DIAGNOSIS — I890 Lymphedema, not elsewhere classified: Secondary | ICD-10-CM | POA: Diagnosis not present

## 2021-07-17 DIAGNOSIS — E785 Hyperlipidemia, unspecified: Secondary | ICD-10-CM | POA: Diagnosis not present

## 2021-07-29 ENCOUNTER — Encounter (INDEPENDENT_AMBULATORY_CARE_PROVIDER_SITE_OTHER): Payer: Self-pay | Admitting: Nurse Practitioner

## 2021-07-29 NOTE — Progress Notes (Signed)
Subjective:    Patient ID: Jonathan Newman, male    DOB: May 02, 1960, 61 y.o.   MRN: [id number] Chief Complaint  Patient presents with   Follow-up    6 month follow up    The patient returns to the office for followup evaluation regarding leg swelling.  The swelling has persisted but with the lymph pump is under much, much better controlled. The pain associated with swelling is decreased. There have not been any interval development of a ulcerations or wounds.  The patient denies problems with the pump, noting it is working well and the leggings are in good condition.  Since the previous visit the patient has been wearing graduated compression stockings and using the lymph pump on a routine basis and  has noted significant improvement in the lymphedema.   Patient stated the lymph pump has been helpful with the treatment of the lymphedema.       Review of Systems  Cardiovascular:  Positive for leg swelling.  Neurological:  Positive for weakness.  All other systems reviewed and are negative.      Objective:   Physical Exam Vitals reviewed.  HENT:     Head: Normocephalic.  Cardiovascular:     Rate and Rhythm: Normal rate.     Pulses: Normal pulses.  Pulmonary:     Effort: Pulmonary effort is normal.  Musculoskeletal:     Right lower leg: Edema present.  Skin:    General: Skin is warm and dry.  Neurological:     Mental Status: He is alert and oriented to person, place, and time.     Motor: Weakness present.     Gait: Gait abnormal.  Psychiatric:        Mood and Affect: Mood normal.        Behavior: Behavior normal.        Thought Content: Thought content normal.        Judgment: Judgment normal.     BP 121/76 (BP Location: Left Arm)   Pulse (!) 44   Resp 15   Past Medical History:  Diagnosis Date   Constipation    CVA (cerebral vascular accident) (HCC)    Diarrhea    Dysarthria and anarthria    Hemiplegia and hemiparesis following cerebral infarction affecting  right dominant side (HCC)    Hyperlipidemia    Pain in right leg     Social History   Socioeconomic History   Marital status: Single    Spouse name: Not on file   Number of children: Not on file   Years of education: Not on file   Highest education level: Not on file  Occupational History   Not on file  Tobacco Use   Smoking status: Former   Smokeless tobacco: Never  Substance and Sexual Activity   Alcohol use: Not Currently   Drug use: Not Currently   Sexual activity: Not on file  Other Topics Concern   Not on file  Social History Narrative   Not on file   Social Determinants of Health   Financial Resource Strain: Not on file  Food Insecurity: Not on file  Transportation Needs: Not on file  Physical Activity: Not on file  Stress: Not on file  Social Connections: Not on file  Intimate Partner Violence: Not on file    History reviewed. No pertinent surgical history.  History reviewed. No pertinent family history.  No Known Allergies     Latest Ref Rng & Units 06/14/2021  3:55 AM 06/13/2021    5:16 AM 06/12/2021    7:54 PM  CBC  WBC 4.0 - 10.5 K/uL 5.3  6.4  5.7   Hemoglobin 13.0 - 17.0 g/dL 73.2  20.2  54.2   Hematocrit 39.0 - 52.0 % 37.8  36.1  39.1   Platelets 150 - 400 K/uL 204  220  244       CMP     Component Value Date/Time   NA 139 06/14/2021 0355   K 3.7 06/14/2021 0355   CL 106 06/14/2021 0355   CO2 27 06/14/2021 0355   GLUCOSE 76 06/14/2021 0355   BUN 26 (H) 06/14/2021 0355   CREATININE 0.93 06/14/2021 0355   CALCIUM 8.2 (L) 06/14/2021 0355   PROT 5.9 (L) 06/14/2021 0355   ALBUMIN 3.3 (L) 06/14/2021 0355   AST 26 06/14/2021 0355   ALT 23 06/14/2021 0355   ALKPHOS 46 06/14/2021 0355   BILITOT 0.9 06/14/2021 0355   GFRNONAA >60 06/14/2021 0355     No results found.     Assessment & Plan:   1. Lymphedema Recommend:  No surgery or intervention at this point in time.    I have reviewed my discussion with the patient regarding  lymphedema and why it  causes symptoms.  Patient will continue wearing graduated compression on a daily basis. The patient should put the compression on first thing in the morning and removing them in the evening. The patient should not sleep in the compression.   In addition, behavioral modification throughout the day will be continued.  This will include frequent elevation (such as in a recliner), use of over the counter pain medications as needed and exercise such as walking.  The systemic causes for chronic edema such as liver, kidney and cardiac etiologies does not appear to have significant changed over the past year.    The patient will continue aggressive use of the  lymph pump.  This will continue to improve the edema control and prevent sequela such as ulcers and infections.   The patient will follow-up with me on an annual basis.    2. Dyslipidemia Continue statin as ordered and reviewed, no changes at this time    Current Outpatient Medications on File Prior to Visit  Medication Sig Dispense Refill   ACETAMINOPHEN PO Take 650 mg by mouth every 6 (six) hours as needed.     atorvastatin (LIPITOR) 40 MG tablet Take 40 mg by mouth at bedtime.     divalproex (DEPAKOTE) 500 MG DR tablet Take 500 mg by mouth 2 (two) times daily. 500mg  in the morning and 1000mg  at night     ELIQUIS 5 MG TABS tablet Take 5 mg by mouth 2 (two) times daily.     fluticasone (FLONASE) 50 MCG/ACT nasal spray Place 1 spray into both nostrils daily.     lisinopril (ZESTRIL) 20 MG tablet Take 20 mg by mouth daily.     loperamide (IMODIUM) 2 MG capsule Take 2 mg by mouth as needed for diarrhea or loose stools.     loratadine (CLARITIN) 10 MG tablet Take 10 mg by mouth daily.     mupirocin ointment (BACTROBAN) 2 % Apply 1 application. topically 3 (three) times daily.     ondansetron (ZOFRAN) 4 MG tablet Take 4 mg by mouth every 8 (eight) hours as needed for nausea or vomiting.     QUEtiapine (SEROQUEL) 25 MG  tablet Take 25 mg by mouth at bedtime.  sertraline (ZOLOFT) 25 MG tablet Take 25 mg by mouth daily.     tamsulosin (FLOMAX) 0.4 MG CAPS capsule Take 0.4 mg by mouth daily.     No current facility-administered medications on file prior to visit.    There are no Patient Instructions on file for this visit. No follow-ups on file.   Georgiana Spinner, NP

## 2021-07-30 ENCOUNTER — Encounter: Payer: Medicaid Other | Admitting: Speech Pathology

## 2021-07-31 ENCOUNTER — Ambulatory Visit: Payer: Medicaid Other | Attending: Family Medicine | Admitting: Physical Therapy

## 2021-07-31 ENCOUNTER — Ambulatory Visit: Payer: Medicaid Other | Admitting: Speech Pathology

## 2021-07-31 DIAGNOSIS — R262 Difficulty in walking, not elsewhere classified: Secondary | ICD-10-CM | POA: Diagnosis present

## 2021-07-31 DIAGNOSIS — R2681 Unsteadiness on feet: Secondary | ICD-10-CM | POA: Insufficient documentation

## 2021-07-31 DIAGNOSIS — M6281 Muscle weakness (generalized): Secondary | ICD-10-CM | POA: Insufficient documentation

## 2021-07-31 NOTE — Therapy (Unsigned)
OUTPATIENT PHYSICAL THERAPY TREATMENT NOTE/ Discharge Summary    Patient Name: Jonathan Newman MRN: [id number] DOB:06/06/1960, 61 y.o., male Today's Date: 08/01/2021  PCP: Jonathan Newman, [place] REFERRING PROVIDER: Ranae Newman, [place]   PT End of Session - 07/31/21 1539     Visit Number 28    Number of Visits 30    Date for PT Re-Evaluation 07/31/21    Authorization Type Medicaid=Authorized combined PT/OT/SLP 27 visits    PT Start Time 3149    PT Stop Time 1600    PT Time Calculation (min) 45 min    Equipment Utilized During Treatment Gait belt    Activity Tolerance Patient tolerated treatment well    Behavior During Therapy Impulsive             Past Medical History:  Diagnosis Date   Constipation    CVA (cerebral vascular accident) ([place])    Diarrhea    Dysarthria and anarthria    Hemiplegia and hemiparesis following cerebral infarction affecting right dominant side (HCC)    Hyperlipidemia    Pain in right leg    History reviewed. No pertinent surgical history. 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 Had developed DVT afterwards. Patient is present with sister, Jonathan Newman today who reports he has been living with her since May 2022 and uses his electric w/c in the home an has not walked since CVA- has manual w/c that he uses for community outings. She reports he requires assist with all ADLs and is impulsive but does require assist with transfers for safety- but states he does go to bathroom on his own at times.   PRECAUTIONS: Fall  SUBJECTIVE: Pt caregiver and pt report getting new WC since last visit, is lightest foldable power wheelchair available.   PAIN:  Are you having pain? No     TODAY'S TREATMENT:  Physical therapy treatment session today consisted of completing assessment of goals and administration of testing as demonstrated in flow sheet. Addition treatments may be found below.   NuStep with right lower extremity held in place with NuStep hip stabilizer.  2 x 3-minute intervals without upper extremity assistance on level 2.  Standing in parallel bars x2 minutes with assist as well as cues for increased right lower extremity weightbearing.  Patient unable to achieve 50% weightbearing but did have approximately 25% weightbearing.  Ambulation in parallel bars with significant upper extremity assist as well as assist from physical therapist for right lower extremity progression.    PATIENT  EDUCATION: Education details: [facility]  Person educated: Patient and Caregiver Education method: Explanation Education comprehension: verbalized understanding   HOME EXERCISE PROGRAM: No changes this date    PT Short Term Goals - 04/24/21 0815       PT SHORT TERM GOAL #1   Title Pt will be independent with HEP in order to improve strength and balance in order to decrease fall risk and improve function  at home and work.    Baseline 10/25/2020= Patient has no formal HEP in place12/8: Pt caregiver report sHEP has been going well.    Time 6    Period Weeks    Status Achieved    Target Date 12/06/20      PT SHORT TERM GOAL #2   Title Patient will perform all sit to stand transfers with CGA for improved functional mobility and decreased dependence on caregivers.    Baseline 10/25/2020=Patient requires mod assist with all sit to stand transfers, 12/8: sister reports improved independence with most transfers, some guarding still necessary for safety, deomnstrates ability to transfer to/from nuscetp and WC as well as to/from mat table.    Time 6    Period Weeks    Status Achieved    Target Date 12/06/20      PT SHORT TERM GOAL #3   Title Patient perform 5 time sit to stand in 50 seconds or less in order to indicate improved lower extremity strength and stability.    Baseline 01/30/21 Able to complete 2 sit to stands but only able to utilize left lower extremity has difficulty with balance upon standing.  04/24/2021= 28 sec with left UE support    Time 12    Period Weeks    Status Achieved    Target Date 04/24/21              PT Long Term Goals - 06/26/21 1544       PT LONG TERM GOAL #1   Title Pt will improve FOTO to target score of 43  to display perceived improvements in ability to complete ADL's.    Baseline 10/25/2020= 29 12/8: 33.1 01/30/21: 36; 06/26/21: 31    Time 12    Period Weeks    Status Revised    Target Date 07/17/21      PT LONG TERM GOAL #2   Title Patient will perform stand pivot transfer with supervision from all surfaces without loss of balance or VC for safety.    Baseline 10/25/2020- Patient requires Mod assist for safe transfers with min ability to place any weight through right LE.01/30/21: able to perform without assistance but still requiries supervision as well as UE assistance on surfaces; 04/24/2021=Patient demo supervision with SPT today from wheelchair to mat and  both he and his sister report he is doing this independently at home.    Time 12    Period Weeks    Status Achieved    Target Date 04/24/21      PT LONG TERM GOAL #3   Title Patient will perform static stand with least restrictive assistive device > 2 min with > 25% weight bearing through right LE without LOB.    Baseline 12/8: able to stand for approximately 30 sec with R LE on airex pad and Min WB on the R LE and ModA from 2x PT for LE placement and to assist with weight shifting, 01/31/20: stands for 1:25 with CGA from PT for balance and with min weight through R LE (foot is  on floor); 04/24/2021= Patient was able to demonstrate 2 min 43 sec of static stand ensuring he was placing approx 25% weight bearing through his Right LE.    Time 12    Period Weeks    Status Achieved    Target Date 03/22/21      PT LONG TERM GOAL #4   Title Patient will perform 5 times sit to stand test and 40 seconds or less in order to indicate increased lower extremity power and ability to perform transfers.    Baseline 1/10/23There were to perform several repetitions of sit to stand and unable to bear weight through left lower extremity and also signs of unsteadiness when going to standing position. 04/24/2021= 28 sec with left UE support and although approx 25 % WB through Right LE    Time 12    Period Weeks    Status Achieved    Target Date 04/24/21      PT LONG TERM GOAL #5   Title Patient will ambulate with least restrictive assistive device and min assist > 25 feet without loss of balance exhibited > 25% weight bearing through Right LE for improved functional mobility in home.    Baseline 04/24/2021- patient demo ability to walk in // bars using 1 rail with max VC and mod assist to transfer some weight over to right side - minimal WB through Right LE. 06/26/21: Patient ambulates 8 feet at a time in parallel bars with minimal weightbearing through right lower extremity 7/12: ambulates 8 feet in // bars with  approximately 25% WB and PT manual A to promote WB on involved side    Time 12    Period Weeks    Status Not Met    Target Date 07/17/21      PT LONG TERM GOAL #6   Title Patient will perform static stand with least restrictive assistive device > 2 min with 50% weight bearing through right LE without LOB.    Baseline 04/24/2021-Patient was able to demonstrate 2 min 43 sec of static stand ensuring he was placing approx 25% weight bearing through his Right LE.6/6: able to stand 1:33 with R AFo and PT assist for R LE WB > 50% 7/12: achieved, with cues for shifting to R side and with significant UE assist    Time 12    Period Weeks    Status Not met    Target Date 07/17/21              Plan - 07/31/21 1602     Clinical Impression Statement Patient to be discharged to skilled physical therapy at this time secondary to insurance visit number limitations.  Patient has been referred to [facility] at [facility] which is community based clinic for individuals to do not have access to physical therapy treatment.  Referral in place and to be followed up on in a couple weeks if they have not heard anything from our clinic or from physician office.  Patient met several of his physical therapy goals and is making adequate quad progress based on limited visit count limited ability to come to clinic throughout the year.  Patient would benefit from further physical therapy in the [facility] clinic as well as encouraged to come back to clinic next year for further evaluation and treatment.   Personal Factors and Comorbidities Comorbidity 3+;Time since onset of injury/illness/exacerbation    Comorbidities CVA, Aphasia, Hyperlipidemia    Examination-Activity Limitations Bathing;Bed Mobility;Bend;Caring for Others;Carry;Lift;Reach Overhead;Squat;Stairs;Stand;Toileting;Transfers    Examination-Participation  Restrictions Cleaning;Community Activity;Driving;Laundry;Medication Management;Meal Prep;Occupation;Personal  Finances;Shop;Yard Work    Rehab Potential Fair    PT Frequency 1x / week    PT Duration 12 weeks    PT Treatment/Interventions ADLs/Self Care Home Management;Aquatic Therapy;Cryotherapy;Electrical Stimulation;Moist Heat;Ultrasound;DME Instruction;Gait training;Stair training;Functional mobility training;Therapeutic activities;Therapeutic exercise;Balance training;Neuromuscular re-education;Patient/family education;Wheelchair mobility training;Manual techniques;Passive range of motion;Dry needling;Splinting;Orthotic Fit/Training;Taping    PT Next Visit Plan D/C   PT Home Exercise Plan no updates    Consulted and Agree with Plan of Care Patient;Family member/caregiver    Family Member Consulted Sister- Jena Gauss, PT 08/01/2021, 10:19 AM

## 2021-08-01 ENCOUNTER — Encounter: Payer: Self-pay | Admitting: Physical Therapy

## 2021-08-07 ENCOUNTER — Ambulatory Visit: Payer: Medicaid Other | Admitting: Speech Pathology

## 2021-08-07 ENCOUNTER — Encounter: Payer: Medicaid Other | Admitting: Speech Pathology

## 2021-08-07 ENCOUNTER — Ambulatory Visit: Payer: Medicaid Other | Admitting: Physical Therapy

## 2021-08-14 ENCOUNTER — Ambulatory Visit: Payer: Medicaid Other | Admitting: Speech Pathology

## 2021-08-14 ENCOUNTER — Ambulatory Visit: Payer: Medicaid Other | Admitting: Physical Therapy

## 2021-08-21 ENCOUNTER — Ambulatory Visit: Payer: Medicaid Other | Admitting: Speech Pathology

## 2021-08-21 ENCOUNTER — Ambulatory Visit: Payer: Medicaid Other | Admitting: Physical Therapy

## 2021-08-21 ENCOUNTER — Encounter: Payer: Medicaid Other | Admitting: Speech Pathology

## 2021-08-28 ENCOUNTER — Ambulatory Visit: Payer: Medicaid Other | Admitting: Physical Therapy

## 2021-08-28 ENCOUNTER — Ambulatory Visit: Payer: Medicaid Other | Admitting: Speech Pathology

## 2021-09-04 ENCOUNTER — Ambulatory Visit: Payer: Medicaid Other | Admitting: Physical Therapy

## 2021-09-04 ENCOUNTER — Ambulatory Visit: Payer: Medicaid Other | Admitting: Speech Pathology

## 2021-09-11 ENCOUNTER — Ambulatory Visit: Payer: Medicaid Other | Admitting: Physical Therapy

## 2021-09-11 ENCOUNTER — Ambulatory Visit: Payer: Medicaid Other | Admitting: Speech Pathology

## 2021-09-18 ENCOUNTER — Ambulatory Visit: Payer: Medicaid Other | Admitting: Speech Pathology

## 2021-09-18 ENCOUNTER — Ambulatory Visit: Payer: Medicaid Other | Admitting: Physical Therapy

## 2021-09-25 ENCOUNTER — Ambulatory Visit: Payer: Medicaid Other | Admitting: Physical Therapy

## 2021-09-30 ENCOUNTER — Other Ambulatory Visit: Payer: Self-pay | Admitting: Urology

## 2021-10-02 ENCOUNTER — Ambulatory Visit: Payer: Medicaid Other | Admitting: Physical Therapy

## 2021-10-09 ENCOUNTER — Ambulatory Visit: Payer: Medicaid Other | Admitting: Physical Therapy

## 2021-10-16 ENCOUNTER — Ambulatory Visit: Payer: Medicaid Other | Admitting: Physical Therapy

## 2021-10-23 ENCOUNTER — Ambulatory Visit: Payer: Medicaid Other | Admitting: Physical Therapy

## 2021-10-30 ENCOUNTER — Ambulatory Visit: Payer: Medicaid Other | Admitting: Physical Therapy

## 2021-11-06 ENCOUNTER — Ambulatory Visit: Payer: Medicaid Other | Admitting: Physical Therapy

## 2021-11-09 ENCOUNTER — Telehealth (INDEPENDENT_AMBULATORY_CARE_PROVIDER_SITE_OTHER): Payer: Self-pay | Admitting: Vascular Surgery

## 2021-11-09 NOTE — Telephone Encounter (Signed)
Compression prescription has been faxed over to [facility] and patient sister was made aware

## 2021-11-09 NOTE — Telephone Encounter (Signed)
Patients sister LVM stating if we could renew patients Rx for Velcro compressions through [facility]. Patient is in need of them ASAP. [name] States that [facility] reached out to [place] on Monday and hasn't gotten an response     Please call patients sister back and advise

## 2021-11-13 ENCOUNTER — Ambulatory Visit: Payer: Medicaid Other | Admitting: Physical Therapy

## 2021-11-20 ENCOUNTER — Ambulatory Visit: Payer: Medicaid Other | Admitting: Physical Therapy

## 2021-11-25 ENCOUNTER — Other Ambulatory Visit: Payer: Self-pay

## 2021-11-25 ENCOUNTER — Emergency Department
Admission: EM | Admit: 2021-11-25 | Discharge: 2021-11-25 | Disposition: A | Discharge status: Home / Self Care | Payer: Medicaid Other | Attending: Emergency Medicine | Admitting: Emergency Medicine

## 2021-11-25 ENCOUNTER — Emergency Department: Payer: Medicaid Other

## 2021-11-25 DIAGNOSIS — R197 Diarrhea, unspecified: Secondary | ICD-10-CM | POA: Insufficient documentation

## 2021-11-25 DIAGNOSIS — R630 Anorexia: Secondary | ICD-10-CM | POA: Diagnosis not present

## 2021-11-25 DIAGNOSIS — R1110 Vomiting, unspecified: Secondary | ICD-10-CM | POA: Diagnosis not present

## 2021-11-25 DIAGNOSIS — Z20822 Contact with and (suspected) exposure to covid-19: Secondary | ICD-10-CM | POA: Diagnosis not present

## 2021-11-25 DIAGNOSIS — N390 Urinary tract infection, site not specified: Secondary | ICD-10-CM

## 2021-11-25 LAB — HEPATIC FUNCTION PANEL
ALT: 14 U/L (ref 0–44)
AST: 18 U/L (ref 15–41)
Albumin: 3.6 g/dL (ref 3.5–5.0)
Alkaline Phosphatase: 51 U/L (ref 38–126)
Bilirubin, Direct: 0.3 mg/dL — ABNORMAL HIGH (ref 0.0–0.2)
Indirect Bilirubin: 0.7 mg/dL (ref 0.3–0.9)
Total Bilirubin: 1 mg/dL (ref 0.3–1.2)
Total Protein: 6.8 g/dL (ref 6.5–8.1)

## 2021-11-25 LAB — CBC
HCT: 42.1 % (ref 39.0–52.0)
Hemoglobin: 14 g/dL (ref 13.0–17.0)
MCH: 32.6 pg (ref 26.0–34.0)
MCHC: 33.3 g/dL (ref 30.0–36.0)
MCV: 98.1 fL (ref 80.0–100.0)
Platelets: 147 10*3/uL — ABNORMAL LOW (ref 150–400)
RBC: 4.29 MIL/uL (ref 4.22–5.81)
RDW: 14.5 % (ref 11.5–15.5)
WBC: 5.7 10*3/uL (ref 4.0–10.5)
nRBC: 0 % (ref 0.0–0.2)

## 2021-11-25 LAB — BASIC METABOLIC PANEL
Anion gap: 11 (ref 5–15)
BUN: 24 mg/dL — ABNORMAL HIGH (ref 8–23)
CO2: 28 mmol/L (ref 22–32)
Calcium: 8.8 mg/dL — ABNORMAL LOW (ref 8.9–10.3)
Chloride: 102 mmol/L (ref 98–111)
Creatinine, Ser: 0.95 mg/dL (ref 0.61–1.24)
GFR, Estimated: >60 mL/min (ref >60)
Glucose, Bld: 76 mg/dL (ref 70–99)
Potassium: 3.6 mmol/L (ref 3.5–5.1)
Sodium: 141 mmol/L (ref 135–145)

## 2021-11-25 LAB — URINALYSIS, ROUTINE W REFLEX MICROSCOPIC
Bilirubin Urine: NEGATIVE
Glucose, UA: NEGATIVE mg/dL
Hgb urine dipstick: NEGATIVE
Ketones, ur: 80 mg/dL — AB
Nitrite: NEGATIVE
Protein, ur: NEGATIVE mg/dL
Specific Gravity, Urine: 1.024 (ref 1.005–1.030)
WBC, UA: >50 WBC/hpf — ABNORMAL HIGH (ref 0–5)
pH: 5 (ref 5.0–8.0)

## 2021-11-25 LAB — SARS CORONAVIRUS 2 BY RT PCR: SARS Coronavirus 2 by RT PCR: NEGATIVE

## 2021-11-25 LAB — TROPONIN I (HIGH SENSITIVITY): Troponin I (High Sensitivity): 10 ng/L (ref <18)

## 2021-11-25 LAB — MAGNESIUM: Magnesium: 2 mg/dL (ref 1.7–2.4)

## 2021-11-25 MED ORDER — LACTATED RINGERS IV BOLUS
1000.0000 mL | Freq: Once | INTRAVENOUS | Status: AC
  Administered 2021-11-25: 1000 mL via INTRAVENOUS

## 2021-11-25 MED ORDER — ONDANSETRON HCL 4 MG/2ML IJ SOLN
4.0000 mg | Freq: Once | INTRAMUSCULAR | Status: AC
  Administered 2021-11-25: 4 mg via INTRAVENOUS
  Filled 2021-11-25: qty 2

## 2021-11-25 MED ORDER — CEFDINIR 300 MG PO CAPS: 300.0000 mg | ORAL_CAPSULE | Freq: Two times a day (BID) | ORAL | 0 refills | Status: AC

## 2021-11-25 MED ORDER — SODIUM CHLORIDE 0.9 % IV SOLN
1.0000 g | Freq: Once | INTRAVENOUS | Status: AC
  Administered 2021-11-25: 1 g via INTRAVENOUS
  Filled 2021-11-25: qty 10

## 2021-11-25 MED ORDER — CEFDINIR 300 MG PO CAPS: 300.0000 mg | ORAL_CAPSULE | Freq: Two times a day (BID) | ORAL | 0 refills | Status: DC

## 2021-11-25 NOTE — ED Triage Notes (Signed)
Pt presents via EMS c/o bradycardia. Per EMS, pt HR in 40s. Currently asymptomatic per EMS. EMS reports possible 3rd degree block.

## 2021-11-25 NOTE — ED Triage Notes (Signed)
Pt to ED from home, [facility]. Family originally called because pt has not urinated since Thursday, is hypotensive with low heart rate, vomiting and diarrhea. EMS did 12 lead on way showing 3rd degree heart block. Pt endorses dizziness. Pt is alert, oriented. Not pale or diaphoretic.  Hx stroke 2021. Pt has dysarthria and R sided paralysis. Sister at bedside.

## 2021-11-25 NOTE — Discharge Instructions (Signed)
Your blood work was all reassuring.  Does look like you have a low urinary tract infection.  You got the first dose of antibiotics through the IV.  We have sent you 7 days of a prescription for cefdinir which she should take twice a day.  Continue to follow-up with your primary doctor and the gastroenterologist for further work-up and evaluation of your fatigue.

## 2021-11-25 NOTE — ED Provider Notes (Signed)
[facility] Provider Note    Event Date/Time   First MD Initiated Contact with Patient 11/25/21 1155     (approximate)   History   anuria, Hypotension, Emesis, and heart block   HPI  Jonathan Newman is a 61 y.o. 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  Patient is accompanied by his 2 sisters who are his caregivers.  He lives with his 1 sister.  She notes that over the last week he has had countless episodes of diarrhea.  Is not been able to get to the toilet to is now wearing a depends.  His stool is liquid.  They know he has not urinated since Friday, 2 days ago.  He is also not been eating and drinking.  The not eating and drinking has been more longstanding but is worse over the last week.  Has not complained of pain has not had fevers chills or any upper respiratory symptoms.  No sick contacts.  No blood in the stool.  No recent antibiotics or recent hospitalizations.  They have been checking his heart rate and is it been in the 40s which is a little bit low for him but he tends to run low.     Past Medical History:  Diagnosis Date   Constipation    CVA (cerebral vascular accident) ([place])    Diarrhea    Dysarthria and anarthria    Hemiplegia and hemiparesis following cerebral infarction affecting right dominant side ([place])    Hyperlipidemia    Pain in right leg     Patient Active Problem List   Diagnosis Date Noted   Hypokalemia 06/13/2021   Depression 06/13/2021   Dyslipidemia 06/13/2021   AKI (acute kidney injury) ([place]) 06/13/2021   Seizure ([place]) 06/12/2021   Status post placement of implantable loop recorder 11/02/2020   Post-phlebitic syndrome 09/05/2020   Lower limb ulcer, calf, right, limited to breakdown of skin ([place]) 03/07/2020   History of CVA (cerebrovascular accident) 03/07/2020   DVT (deep venous thrombosis) ([place]) 03/07/2020      Physical Exam  Triage Vital Signs: ED Triage Vitals  Enc Vitals Group     BP 11/25/21 1151 128/66     Pulse Rate 11/25/21 1151 (!) 43     Resp 11/25/21 1151 16     Temp 11/25/21 1151 98.6 F (37 C)     Temp Source 11/25/21 1151 Oral     SpO2 11/25/21 1151 96 %     Weight 11/25/21 1153 164 lb (74.4 kg)     Height 11/25/21 1153 5\' 2"  (1.575 m)     Head Circumference --      Peak Flow --      Pain Score --      Pain Loc --      Pain Edu? --      Excl. in [place]? --     Most recent vital signs: Vitals:   11/25/21 1400 11/25/21 1430  BP: 119/77 125/72  Pulse: (!) 48 (!) 44  Resp: 13 11  Temp:    SpO2: 96% 96%     General: Awake, no distress.  Dry mucous membranes CV:  Good peripheral perfusion.  Resp:  Normal effort.  Abd:  No distention.  Abdomen is soft nontender throughout Neuro:             Awake, Alert, significant aphasia which is patient's baseline, right-sided hemiparesis Other:  ED Results / Procedures / Treatments  Labs (all labs ordered are listed, but only abnormal results are displayed) Labs Reviewed  BASIC METABOLIC PANEL - Abnormal; Notable for the following components:      Result Value   BUN 24 (*)    Calcium 8.8 (*)    All other components within normal limits  CBC - Abnormal; Notable for the following components:   Platelets 147 (*)    All other components within normal limits  HEPATIC FUNCTION PANEL - Abnormal; Notable for the following components:   Bilirubin, Direct 0.3 (*)    All other components within normal limits  SARS CORONAVIRUS 2 BY RT PCR  GASTROINTESTINAL PANEL BY PCR, STOOL (REPLACES STOOL CULTURE)  C DIFFICILE QUICK SCREEN W PCR REFLEX    MAGNESIUM  URINALYSIS, ROUTINE W REFLEX MICROSCOPIC  TROPONIN I (HIGH SENSITIVITY)     EKG  EKG reviewed interpreted myself shows marked sinus bradycardia heart rates in the 40s, normal axis normal intervals diffuse T wave flattening   RADIOLOGY  I reviewed and interpreted the  CXR which does not show any acute cardiopulmonary process   PROCEDURES:  Critical Care performed: No  .1-3 Lead EKG Interpretation  Performed by: Rada Hay, MD Authorized by: Rada Hay, MD     Interpretation: abnormal     ECG rate assessment: bradycardic     Rhythm: sinus bradycardia     Ectopy: none     Conduction: normal     The patient is on the cardiac monitor to evaluate for evidence of arrhythmia and/or significant heart rate changes.   MEDICATIONS ORDERED IN ED: Medications  lactated ringers bolus 1,000 mL (has no administration in time range)  lactated ringers bolus 1,000 mL (1,000 mLs Intravenous New Bag/Given 11/25/21 1214)  ondansetron (ZOFRAN) injection 4 mg (4 mg Intravenous Given 11/25/21 1429)     IMPRESSION / MDM / ASSESSMENT AND PLAN / ED COURSE  I reviewed the triage vital signs and the nursing notes.                              Patient's presentation is most consistent with acute presentation with potential threat to life or bodily function.  Differential diagnosis includes, but is not limited to, renal failure, electrolyte abnormality including hyper or hypokalemia, infectious diarrhea, colitis, UTI, viral illness The patient is a 61 year old male presenting with diarrhea decreased p.o. intake decreased urine output.  On arrival to ED heart rates are in the 40s EKG showing sinus bradycardia with diffuse T wave flattening no overt evidence of hyperkalemia.  Patient overall looks well nontoxic.  He is aphasic at baseline.  Does look dry with very dry mucous membranes his abdomen is soft nontender he is denying pain currently in the abdomen.  Plan to check labs I have a suspicion he could be with significant AKI and could have electrolyte derangements contributing to the bradycardia.  We will give a liter of fluid.  We will also send stool studies.  Patient's labs are actually largely within normal limits he has no acute kidney injury his  electrolytes are normal blood counts are normal.  Chest x-ray is clear.  On reassessment with talking to family it sounds like a lot of his symptoms have been subacute his decreased appetite decreased activity level and lack of sleep him going on for several months.  He is already seen GI and they have wanted to get stool  samples but patient sisters not been able to get a sample because he goes to the bathroom so quickly when he needs to go.  Given the bradycardia and his other symptoms I considered diagnosis of hypothyroidism but just several days ago patient had normal TSH so feel this is less likely.  Patient is asking to leave.  He has not yet urinated I passed nursing to do a bladder scan we will give another liter of fluid but ultimately think that he will be appropriate for discharge after we ruled out a UTI with ongoing follow-up with PCP and GI.      FINAL CLINICAL IMPRESSION(S) / ED DIAGNOSES   Final diagnoses:  Diarrhea, unspecified type  Decreased appetite     Rx / DC Orders   ED Discharge Orders     None        Note:  This document was prepared using Dragon voice recognition software and may include unintentional dictation errors.   Rada Hay, MD 11/25/21 [phone]

## 2021-11-27 ENCOUNTER — Ambulatory Visit: Payer: Medicaid Other | Admitting: Physical Therapy

## 2021-11-27 LAB — URINE CULTURE: Culture: >=100000 — AB

## 2021-12-04 ENCOUNTER — Ambulatory Visit: Payer: Medicaid Other | Admitting: Physical Therapy

## 2021-12-11 ENCOUNTER — Ambulatory Visit: Payer: Medicaid Other | Admitting: Physical Therapy

## 2021-12-18 ENCOUNTER — Ambulatory Visit: Payer: Medicaid Other | Admitting: Physical Therapy

## 2021-12-22 ENCOUNTER — Other Ambulatory Visit: Payer: Self-pay | Admitting: Urology

## 2022-01-21 DEATH — deceased

## 2022-01-23 ENCOUNTER — Ambulatory Visit: Payer: Medicaid Other | Admitting: Dietician

## 2022-07-16 ENCOUNTER — Ambulatory Visit (INDEPENDENT_AMBULATORY_CARE_PROVIDER_SITE_OTHER): Payer: Medicaid Other | Admitting: Vascular Surgery
